# Patient Record
Sex: Male | Born: 1945 | Race: White | Hispanic: No | Marital: Married | Smoking: Former smoker
Health system: Southern US, Community
[De-identification: ages and names within clinical notes are randomized; demographics above are authoritative.]

## PROBLEM LIST (undated history)

## (undated) DIAGNOSIS — C459 Mesothelioma, unspecified: Secondary | ICD-10-CM

## (undated) HISTORY — DX: Mesothelioma, unspecified: C45.9

## (undated) HISTORY — PX: WRIST FRACTURE SURGERY: SHX121

---

## 2006-01-30 ENCOUNTER — Emergency Department (HOSPITAL_COMMUNITY): Admission: EM | Admit: 2006-01-30 | Discharge: 2006-01-31 | Payer: Self-pay | Admitting: Emergency Medicine

## 2010-02-19 ENCOUNTER — Inpatient Hospital Stay (HOSPITAL_COMMUNITY): Admission: EM | Admit: 2010-02-19 | Discharge: 2010-02-23 | Payer: Self-pay | Admitting: Emergency Medicine

## 2010-02-19 ENCOUNTER — Ambulatory Visit: Payer: Self-pay | Admitting: Cardiovascular Disease

## 2010-02-19 ENCOUNTER — Ambulatory Visit: Payer: Self-pay | Admitting: Internal Medicine

## 2010-02-23 ENCOUNTER — Ambulatory Visit: Payer: Self-pay | Admitting: Cardiovascular Disease

## 2010-03-01 ENCOUNTER — Encounter: Admission: RE | Admit: 2010-03-01 | Discharge: 2010-03-01 | Payer: Self-pay | Admitting: Sports Medicine

## 2010-09-08 LAB — COMPREHENSIVE METABOLIC PANEL
Albumin: 3.3 g/dL — ABNORMAL LOW (ref 3.5–5.2)
BUN: 6 mg/dL (ref 6–23)
Calcium: 8.4 mg/dL (ref 8.4–10.5)
Creatinine, Ser: 0.97 mg/dL (ref 0.4–1.5)
GFR calc non Af Amer: >60 mL/min (ref >60)
Glucose, Bld: 162 mg/dL — ABNORMAL HIGH (ref 70–99)
Sodium: 142 mEq/L (ref 135–145)

## 2010-09-08 LAB — BASIC METABOLIC PANEL
BUN: 5 mg/dL — ABNORMAL LOW (ref 6–23)
CO2: 27 mEq/L (ref 19–32)
Chloride: 113 mEq/L — ABNORMAL HIGH (ref 96–112)
Creatinine, Ser: 0.94 mg/dL (ref 0.4–1.5)
GFR calc Af Amer: >60 mL/min (ref >60)
Potassium: 4.3 mEq/L (ref 3.5–5.1)

## 2010-09-08 LAB — CARDIAC PANEL(CRET KIN+CKTOT+MB+TROPI)
CK, MB: 1.1 ng/mL (ref 0.3–4.0)
CK, MB: 1.3 ng/mL (ref 0.3–4.0)
Relative Index: 0.7 (ref 0.0–2.5)
Relative Index: 0.8 (ref 0.0–2.5)
Troponin I: 0.01 ng/mL (ref 0.00–0.06)
Troponin I: 0.01 ng/mL (ref 0.00–0.06)

## 2010-09-08 LAB — CBC
HCT: 36.8 % — ABNORMAL LOW (ref 39.0–52.0)
Hemoglobin: 12.4 g/dL — ABNORMAL LOW (ref 13.0–17.0)
MCH: 31.3 pg (ref 26.0–34.0)
MCHC: 33.7 g/dL (ref 30.0–36.0)
MCHC: 34.4 g/dL (ref 30.0–36.0)
Platelets: 176 10*3/uL (ref 150–400)
RBC: 3.96 MIL/uL — ABNORMAL LOW (ref 4.22–5.81)
RBC: 4.4 MIL/uL (ref 4.22–5.81)

## 2010-09-08 LAB — DIFFERENTIAL
Basophils Absolute: 0 10*3/uL (ref 0.0–0.1)
Basophils Relative: 0 % (ref 0–1)
Basophils Relative: 0 % (ref 0–1)
Eosinophils Absolute: 0 10*3/uL (ref 0.0–0.7)
Eosinophils Absolute: 0.1 10*3/uL (ref 0.0–0.7)
Eosinophils Relative: 0 % (ref 0–5)
Eosinophils Relative: 2 % (ref 0–5)
Lymphocytes Relative: 12 % (ref 12–46)
Lymphocytes Relative: 13 % (ref 12–46)
Lymphs Abs: 0.8 10*3/uL (ref 0.7–4.0)
Lymphs Abs: 0.8 10*3/uL (ref 0.7–4.0)
Monocytes Absolute: 0.5 10*3/uL (ref 0.1–1.0)
Monocytes Relative: 7 % (ref 3–12)
Neutro Abs: 4.9 10*3/uL (ref 1.7–7.7)
Neutro Abs: 5.2 10*3/uL (ref 1.7–7.7)
Neutrophils Relative %: 74 % (ref 43–77)
Neutrophils Relative %: 79 % — ABNORMAL HIGH (ref 43–77)

## 2010-09-08 LAB — VITAMIN B12: Vitamin B-12: 783 pg/mL (ref 211–911)

## 2010-09-08 LAB — POCT I-STAT, CHEM 8
Chloride: 110 mEq/L (ref 96–112)
Sodium: 143 mEq/L (ref 135–145)
TCO2: 23 mmol/L (ref 0–100)

## 2010-09-08 LAB — RETICULOCYTES
RBC.: 3.89 MIL/uL — ABNORMAL LOW (ref 4.22–5.81)
Retic Count, Absolute: 31.1 10*3/uL (ref 19.0–186.0)

## 2010-09-08 LAB — FERRITIN: Ferritin: 118 ng/mL (ref 22–322)

## 2010-09-08 LAB — IRON AND TIBC
Iron: 60 ug/dL (ref 42–135)
TIBC: 253 ug/dL (ref 215–435)

## 2010-09-08 LAB — D-DIMER, QUANTITATIVE: D-Dimer, Quant: 1.74 ug/mL-FEU — ABNORMAL HIGH (ref 0.00–0.48)

## 2015-12-07 ENCOUNTER — Emergency Department (HOSPITAL_COMMUNITY): Admission: EM | Admit: 2015-12-07 | Discharge: 2015-12-08 | Discharge status: Against Medical Advice | Payer: 59

## 2015-12-07 NOTE — ED Notes (Signed)
Pt called, no answer. 1st attempt.

## 2015-12-10 ENCOUNTER — Emergency Department (HOSPITAL_COMMUNITY)
Admission: EM | Admit: 2015-12-10 | Discharge: 2015-12-10 | Disposition: A | Discharge status: Home / Self Care | Payer: Medicare Other | Attending: Emergency Medicine | Admitting: Emergency Medicine

## 2015-12-10 ENCOUNTER — Encounter (HOSPITAL_COMMUNITY): Payer: Self-pay | Admitting: Family Medicine

## 2015-12-10 ENCOUNTER — Emergency Department (HOSPITAL_COMMUNITY): Payer: Medicare Other

## 2015-12-10 DIAGNOSIS — K5900 Constipation, unspecified: Secondary | ICD-10-CM | POA: Insufficient documentation

## 2015-12-10 DIAGNOSIS — N200 Calculus of kidney: Secondary | ICD-10-CM | POA: Diagnosis not present

## 2015-12-10 DIAGNOSIS — N202 Calculus of kidney with calculus of ureter: Secondary | ICD-10-CM | POA: Diagnosis not present

## 2015-12-10 DIAGNOSIS — R1084 Generalized abdominal pain: Secondary | ICD-10-CM | POA: Diagnosis not present

## 2015-12-10 DIAGNOSIS — Z7982 Long term (current) use of aspirin: Secondary | ICD-10-CM | POA: Diagnosis not present

## 2015-12-10 DIAGNOSIS — R1032 Left lower quadrant pain: Secondary | ICD-10-CM | POA: Diagnosis not present

## 2015-12-10 DIAGNOSIS — R109 Unspecified abdominal pain: Secondary | ICD-10-CM

## 2015-12-10 LAB — COMPREHENSIVE METABOLIC PANEL
ALK PHOS: 54 U/L (ref 38–126)
ALT: 18 U/L (ref 17–63)
AST: 21 U/L (ref 15–41)
Albumin: 3.8 g/dL (ref 3.5–5.0)
Anion gap: 6 (ref 5–15)
BILIRUBIN TOTAL: 0.9 mg/dL (ref 0.3–1.2)
BUN: 18 mg/dL (ref 6–20)
CALCIUM: 9 mg/dL (ref 8.9–10.3)
CO2: 24 mmol/L (ref 22–32)
CREATININE: 1.93 mg/dL — AB (ref 0.61–1.24)
Chloride: 107 mmol/L (ref 101–111)
GFR, EST AFRICAN AMERICAN: 40 mL/min — AB (ref >60)
GFR, EST NON AFRICAN AMERICAN: 34 mL/min — AB (ref >60)
Glucose, Bld: 192 mg/dL — ABNORMAL HIGH (ref 65–99)
Potassium: 3.8 mmol/L (ref 3.5–5.1)
Sodium: 137 mmol/L (ref 135–145)
TOTAL PROTEIN: 6.3 g/dL — AB (ref 6.5–8.1)

## 2015-12-10 LAB — URINE MICROSCOPIC-ADD ON

## 2015-12-10 LAB — CBC
HCT: 41.7 % (ref 39.0–52.0)
Hemoglobin: 13.6 g/dL (ref 13.0–17.0)
MCH: 30.6 pg (ref 26.0–34.0)
MCHC: 32.6 g/dL (ref 30.0–36.0)
MCV: 93.9 fL (ref 78.0–100.0)
PLATELETS: 202 10*3/uL (ref 150–400)
RBC: 4.44 MIL/uL (ref 4.22–5.81)
RDW: 13.6 % (ref 11.5–15.5)
WBC: 7.2 10*3/uL (ref 4.0–10.5)

## 2015-12-10 LAB — URINALYSIS, ROUTINE W REFLEX MICROSCOPIC
Bilirubin Urine: NEGATIVE
GLUCOSE, UA: NEGATIVE mg/dL
KETONES UR: 15 mg/dL — AB
LEUKOCYTES UA: NEGATIVE
Nitrite: NEGATIVE
PROTEIN: NEGATIVE mg/dL
Specific Gravity, Urine: 1.026 (ref 1.005–1.030)
pH: 5.5 (ref 5.0–8.0)

## 2015-12-10 LAB — LIPASE, BLOOD: LIPASE: 31 U/L (ref 11–51)

## 2015-12-10 MED ORDER — SODIUM CHLORIDE 0.9 % IV BOLUS (SEPSIS)
1000.0000 mL | Freq: Once | INTRAVENOUS | Status: AC
  Administered 2015-12-10: 1000 mL via INTRAVENOUS

## 2015-12-10 MED ORDER — TAMSULOSIN HCL 0.4 MG PO CAPS
0.4000 mg | ORAL_CAPSULE | Freq: Every day | ORAL | Status: DC
Start: 2015-12-10 — End: 2019-06-29

## 2015-12-10 MED ORDER — MAGNESIUM CITRATE PO SOLN: 1.0000 | Freq: Once | ORAL | Status: DC

## 2015-12-10 MED ORDER — ONDANSETRON 4 MG PO TBDP: 4.0000 mg | ORAL_TABLET | Freq: Three times a day (TID) | ORAL | Status: DC | PRN

## 2015-12-10 MED ORDER — HYDROCODONE-ACETAMINOPHEN 5-325 MG PO TABS: 2.0000 | ORAL_TABLET | ORAL | Status: DC | PRN

## 2015-12-10 NOTE — ED Notes (Signed)
Pt is in stable condition upon d/c and ambulates from ED. 

## 2015-12-10 NOTE — ED Provider Notes (Signed)
CSN: [id number]     Arrival date & time 12/10/15  1138 History   First MD Initiated Contact with Patient 12/10/15 1313     Chief Complaint  Patient presents with  . Abdominal Pain     (Consider location/radiation/quality/duration/timing/severity/associated sxs/prior Treatment) HPI  3 days of constipation despite miralax, 9/10 pain LLQ now radiating to lower back Woke up Wednesday feeling ok, dull pain later in the afternoon now severe squeezing pain, only better with moving around, advil, miralax without help.  Thursday improved some, Friday bicycle for 82mles then pain returned. Still no BM. Nothing on fleets enema. Is passing flatus.   Went to urgent care, has had kidney stone before but not exactly the same.    History reviewed. No pertinent past medical history. History reviewed. No pertinent past surgical history. History reviewed. No pertinent family history. Social History  Substance Use Topics  . Smoking status: Never Smoker   . Smokeless tobacco: None  . Alcohol Use: None    Review of Systems  Constitutional: Positive for appetite change. Negative for fever.  HENT: Negative for sore throat.   Eyes: Negative for visual disturbance.  Respiratory: Negative for shortness of breath.   Cardiovascular: Negative for chest pain.  Gastrointestinal: Positive for nausea, vomiting and abdominal pain.  Genitourinary: Negative for difficulty urinating.  Musculoskeletal: Positive for back pain. Negative for neck stiffness.  Skin: Negative for rash.  Neurological: Negative for syncope and headaches.      Allergies  Review of patient's allergies indicates no known allergies.  Home Medications   Prior to Admission medications   Medication Sig Start Date End Date Taking? Authorizing Provider  aspirin 81 MG tablet Take 81 mg by mouth daily.   Yes Historical Provider, MD  Ginkgo Biloba 40 MG TABS Take 40 mg by mouth daily.   Yes Historical Provider, MD  Multiple  Vitamins-Minerals (MULTIVITAMIN WITH MINERALS) tablet Take 1 tablet by mouth daily.   Yes Historical Provider, MD  HYDROcodone-acetaminophen (NORCO/VICODIN) 5-325 MG tablet Take 2 tablets by mouth every 4 (four) hours as needed. 12/10/15   EGareth Morgan MD  magnesium citrate SOLN Take 296 mLs (1 Bottle total) by mouth once. 12/10/15   EGareth Morgan MD  ondansetron (ZOFRAN ODT) 4 MG disintegrating tablet Take 1 tablet (4 mg total) by mouth every 8 (eight) hours as needed for nausea or vomiting. 12/10/15   EGareth Morgan MD  tamsulosin (FLOMAX) 0.4 MG CAPS capsule Take 1 capsule (0.4 mg total) by mouth daily. 12/10/15   EGareth Morgan MD   BP 159/92 mmHg  Pulse 65  Temp(Src) 98 F (36.7 C) (Oral)  Resp 18  Ht '5\' 7"'$  (1.702 m)  Wt 152 lb 1 oz (68.975 kg)  BMI 23.81 kg/m2  SpO2 98% Physical Exam  Constitutional: He is oriented to person, place, and time. He appears well-developed and well-nourished. No distress.  HENT:  Head: Normocephalic and atraumatic.  Eyes: Conjunctivae and EOM are normal.  Neck: Normal range of motion.  Cardiovascular: Normal rate, regular rhythm, normal heart sounds and intact distal pulses.  Exam reveals no gallop and no friction rub.   No murmur heard. Pulmonary/Chest: Effort normal and breath sounds normal. No respiratory distress. He has no wheezes. He has no rales.  Abdominal: Soft. He exhibits no distension. There is no tenderness. There is CVA tenderness (left). There is no guarding.  Musculoskeletal: He exhibits no edema.  Neurological: He is alert and oriented to person, place, and time.  Skin: Skin is warm  and dry. He is not diaphoretic.  Nursing note and vitals reviewed.   ED Course  Procedures (including critical care time) Labs Review Labs Reviewed  COMPREHENSIVE METABOLIC PANEL - Abnormal; Notable for the following:    Glucose, Bld 192 (*)    Creatinine, Ser 1.93 (*)    Total Protein 6.3 (*)    GFR calc non Af Amer 34 (*)    GFR calc Af  Amer 40 (*)    All other components within normal limits  URINALYSIS, ROUTINE W REFLEX MICROSCOPIC (NOT AT [facility]) - Abnormal; Notable for the following:    APPearance CLOUDY (*)    Hgb urine dipstick LARGE (*)    Ketones, ur 15 (*)    All other components within normal limits  URINE MICROSCOPIC-ADD ON - Abnormal; Notable for the following:    Squamous Epithelial / LPF 0-5 (*)    Bacteria, UA RARE (*)    All other components within normal limits  LIPASE, BLOOD  CBC    Imaging Review Ct Renal Stone Study  12/10/2015  CLINICAL DATA:  Left flank pain. EXAM: CT ABDOMEN AND PELVIS WITHOUT CONTRAST TECHNIQUE: Multidetector CT imaging of the abdomen and pelvis was performed following the standard protocol without IV contrast. COMPARISON:  CT abdomen pelvis 03/01/2010 FINDINGS: Lower chest:  No acute findings. Hepatobiliary: No mass visualized on this un-enhanced exam. Pancreas: No mass or inflammatory process identified on this un-enhanced exam. Spleen: Within normal limits in size. Adrenals/Urinary Tract: No evidence of right hydronephrosis. There is a 3 mm nonobstructing right renal calculus within the lower pole. There is a 4.4 mm calculus within the proximal left ureter with associated upstream hydroureter and mild left hydronephrosis. There are significant left perirenal inflammatory changes. No definite mass visualized on this un-enhanced exam. Stomach/Bowel: No evidence of obstruction, inflammatory process, or abnormal fluid collections. Diffuse colonic diverticulosis without evidence of diverticulitis. Normal appendix. Vascular/Lymphatic: No pathologically enlarged lymph nodes. No evidence of abdominal aortic aneurysm. Reproductive: Heterogeneous appearance of the prostate gland. Other: None. Musculoskeletal: No suspicious bone lesions identified. Osteoarthritic changes of the lumbosacral spine particularly prominent and L2-L3 where there is a large disc osteophyte complex. IMPRESSION: Left  obstructive uropathy caused by 4.4 mm left proximal ureteral calculus. Nonobstructive 3 mm right renal calculus. Heterogeneous appearance of the prostate gland. Please correlate with serum PSA values. Scattered colonic diverticulosis without evidence of diverticulitis. Electronically Signed   By: Fidela Salisbury M.D.   On: 12/10/2015 15:56   I have personally reviewed and evaluated these images and lab results as part of my medical decision-making.   EKG Interpretation None      MDM   Final diagnoses:  Left flank pain  Nephrolithiasis  Constipation, unspecified constipation type   70yo male with remote hx of nephrolithiasis presents with concern for left lower quadrant and flank pain, constipation for 3 days. CT shows obstructing 4.43m left nephrolithiasis. Labs show Cr 1.9, unknown baseline in setting of last Cr from 6 years ago, no PCP follow up. Cr elevation may be dehydration due to decreased po intake over the last several days, versus acute on chronic injury, vs chronic. Given IVF in the emergency department. Recommend close PCP follow up and Urology follow up for elevated Creatinine, nephrolithiasis and heterogeneous appearance of prostate on CT.  Discussed constipation treatment with pt in detail, avoiding opiates, avoiding NSAIDs given kidney disease. Discussed taking senokot and miralax 4 caps in one 32oz bottle, miralax taper over next days, mag citrate if no results,  and lots of hydration for both nephrolithiasis and constipation.  Given flomax, zofran, norco for nephrolithiasis. Given number for urology follow up. Patient discharged in stable condition with understanding of reasons to return.     Gareth Morgan, MD 12/10/15 2244

## 2015-12-10 NOTE — ED Notes (Signed)
Pt here for LLQ pain that started Wednesday. sts sudden onset with diaphoresis, N,V. sts took miralax Wednesday night and Thursday the pain improved. sts took miralax again Thursday night and Friday morning he felt fine. sts he rode his bike 12 miles. sts this morning the pain returned and radiated into back and became intense. sts no BM in 3 days. sts tried fleet enema Friday night

## 2015-12-13 DIAGNOSIS — N200 Calculus of kidney: Secondary | ICD-10-CM | POA: Diagnosis not present

## 2016-01-03 DIAGNOSIS — N201 Calculus of ureter: Secondary | ICD-10-CM | POA: Diagnosis not present

## 2017-09-30 DIAGNOSIS — H02401 Unspecified ptosis of right eyelid: Secondary | ICD-10-CM | POA: Diagnosis not present

## 2017-09-30 DIAGNOSIS — H25013 Cortical age-related cataract, bilateral: Secondary | ICD-10-CM | POA: Diagnosis not present

## 2017-09-30 DIAGNOSIS — H1851 Endothelial corneal dystrophy: Secondary | ICD-10-CM | POA: Diagnosis not present

## 2017-09-30 DIAGNOSIS — H2513 Age-related nuclear cataract, bilateral: Secondary | ICD-10-CM | POA: Diagnosis not present

## 2018-08-03 DIAGNOSIS — S6992XA Unspecified injury of left wrist, hand and finger(s), initial encounter: Secondary | ICD-10-CM | POA: Diagnosis not present

## 2018-08-03 DIAGNOSIS — S52614A Nondisplaced fracture of right ulna styloid process, initial encounter for closed fracture: Secondary | ICD-10-CM | POA: Diagnosis not present

## 2018-08-03 DIAGNOSIS — M25532 Pain in left wrist: Secondary | ICD-10-CM | POA: Diagnosis not present

## 2018-08-04 DIAGNOSIS — M25532 Pain in left wrist: Secondary | ICD-10-CM | POA: Diagnosis not present

## 2018-08-07 DIAGNOSIS — G8918 Other acute postprocedural pain: Secondary | ICD-10-CM | POA: Diagnosis not present

## 2018-08-07 DIAGNOSIS — S52572A Other intraarticular fracture of lower end of left radius, initial encounter for closed fracture: Secondary | ICD-10-CM | POA: Diagnosis not present

## 2018-08-07 DIAGNOSIS — S52502A Unspecified fracture of the lower end of left radius, initial encounter for closed fracture: Secondary | ICD-10-CM | POA: Diagnosis not present

## 2018-08-07 DIAGNOSIS — X58XXXA Exposure to other specified factors, initial encounter: Secondary | ICD-10-CM | POA: Diagnosis not present

## 2018-08-07 DIAGNOSIS — Y999 Unspecified external cause status: Secondary | ICD-10-CM | POA: Diagnosis not present

## 2018-08-18 DIAGNOSIS — S52502D Unspecified fracture of the lower end of left radius, subsequent encounter for closed fracture with routine healing: Secondary | ICD-10-CM | POA: Diagnosis not present

## 2019-05-28 DIAGNOSIS — Z0000 Encounter for general adult medical examination without abnormal findings: Secondary | ICD-10-CM | POA: Diagnosis not present

## 2019-05-28 DIAGNOSIS — Z131 Encounter for screening for diabetes mellitus: Secondary | ICD-10-CM | POA: Diagnosis not present

## 2019-05-28 DIAGNOSIS — M549 Dorsalgia, unspecified: Secondary | ICD-10-CM | POA: Diagnosis not present

## 2019-05-28 DIAGNOSIS — Z1159 Encounter for screening for other viral diseases: Secondary | ICD-10-CM | POA: Diagnosis not present

## 2019-05-28 DIAGNOSIS — Z13220 Encounter for screening for lipoid disorders: Secondary | ICD-10-CM | POA: Diagnosis not present

## 2019-05-28 DIAGNOSIS — J189 Pneumonia, unspecified organism: Secondary | ICD-10-CM | POA: Diagnosis not present

## 2019-06-27 ENCOUNTER — Other Ambulatory Visit: Payer: Self-pay

## 2019-06-27 ENCOUNTER — Encounter (HOSPITAL_COMMUNITY): Payer: Self-pay | Admitting: Emergency Medicine

## 2019-06-27 ENCOUNTER — Emergency Department (HOSPITAL_COMMUNITY)
Admission: EM | Admit: 2019-06-27 | Discharge: 2019-06-27 | Disposition: A | Discharge status: Home / Self Care | Payer: Medicare Other | Attending: Emergency Medicine | Admitting: Emergency Medicine

## 2019-06-27 ENCOUNTER — Emergency Department (HOSPITAL_COMMUNITY): Payer: Medicare Other

## 2019-06-27 DIAGNOSIS — Z7982 Long term (current) use of aspirin: Secondary | ICD-10-CM | POA: Diagnosis not present

## 2019-06-27 DIAGNOSIS — Z79899 Other long term (current) drug therapy: Secondary | ICD-10-CM | POA: Insufficient documentation

## 2019-06-27 DIAGNOSIS — R918 Other nonspecific abnormal finding of lung field: Secondary | ICD-10-CM | POA: Diagnosis not present

## 2019-06-27 DIAGNOSIS — R079 Chest pain, unspecified: Secondary | ICD-10-CM | POA: Diagnosis not present

## 2019-06-27 LAB — CBC
HCT: 41.8 % (ref 39.0–52.0)
Hemoglobin: 13.3 g/dL (ref 13.0–17.0)
MCH: 30.2 pg (ref 26.0–34.0)
MCHC: 31.8 g/dL (ref 30.0–36.0)
MCV: 95 fL (ref 80.0–100.0)
Platelets: 402 10*3/uL — ABNORMAL HIGH (ref 150–400)
RBC: 4.4 MIL/uL (ref 4.22–5.81)
RDW: 13.2 % (ref 11.5–15.5)
WBC: 9.5 10*3/uL (ref 4.0–10.5)
nRBC: 0 % (ref 0.0–0.2)

## 2019-06-27 LAB — BASIC METABOLIC PANEL
Anion gap: 10 (ref 5–15)
BUN: 10 mg/dL (ref 8–23)
CO2: 24 mmol/L (ref 22–32)
Calcium: 9.1 mg/dL (ref 8.9–10.3)
Chloride: 106 mmol/L (ref 98–111)
Creatinine, Ser: 1.03 mg/dL (ref 0.61–1.24)
GFR calc Af Amer: >60 mL/min (ref >60)
GFR calc non Af Amer: >60 mL/min (ref >60)
Glucose, Bld: 116 mg/dL — ABNORMAL HIGH (ref 70–99)
Potassium: 4.2 mmol/L (ref 3.5–5.1)
Sodium: 140 mmol/L (ref 135–145)

## 2019-06-27 LAB — HEPATIC FUNCTION PANEL
ALT: 25 U/L (ref 0–44)
AST: 25 U/L (ref 15–41)
Albumin: 3.7 g/dL (ref 3.5–5.0)
Alkaline Phosphatase: 70 U/L (ref 38–126)
Bilirubin, Direct: 0.2 mg/dL (ref 0.0–0.2)
Indirect Bilirubin: 0.4 mg/dL (ref 0.3–0.9)
Total Bilirubin: 0.6 mg/dL (ref 0.3–1.2)
Total Protein: 8 g/dL (ref 6.5–8.1)

## 2019-06-27 LAB — TROPONIN I (HIGH SENSITIVITY)
Troponin I (High Sensitivity): <2 ng/L (ref <18)
Troponin I (High Sensitivity): <2 ng/L (ref <18)

## 2019-06-27 LAB — LIPASE, BLOOD: Lipase: 34 U/L (ref 11–51)

## 2019-06-27 MED ORDER — HYDROCODONE-ACETAMINOPHEN 5-325 MG PO TABS: 1.0000 | ORAL_TABLET | Freq: Four times a day (QID) | ORAL | 0 refills | Status: DC | PRN

## 2019-06-27 MED ORDER — HYDROMORPHONE HCL 1 MG/ML IJ SOLN
1.0000 mg | Freq: Once | INTRAMUSCULAR | Status: AC
  Administered 2019-06-27: 1 mg via INTRAVENOUS
  Filled 2019-06-27: qty 1

## 2019-06-27 MED ORDER — IOHEXOL 300 MG/ML  SOLN
75.0000 mL | Freq: Once | INTRAMUSCULAR | Status: AC | PRN
  Administered 2019-06-27: 75 mL via INTRAVENOUS

## 2019-06-27 MED ORDER — SODIUM CHLORIDE 0.9% FLUSH
3.0000 mL | Freq: Once | INTRAVENOUS | Status: AC
  Administered 2019-06-27: 3 mL via INTRAVENOUS

## 2019-06-27 MED ORDER — SODIUM CHLORIDE 0.9 % IV SOLN: INTRAVENOUS | Status: DC

## 2019-06-27 MED ORDER — ONDANSETRON HCL 4 MG/2ML IJ SOLN
4.0000 mg | Freq: Once | INTRAMUSCULAR | Status: AC
  Administered 2019-06-27: 12:00:00 4 mg via INTRAVENOUS
  Filled 2019-06-27: qty 2

## 2019-06-27 NOTE — Discharge Instructions (Addendum)
Discussed with Dr. Earlie Server at the hematology oncology clinic at [facility].  They will be contacting you probably on Monday for follow-up.  Or certainly sometime early next week.  Should hear from them on Monday.  They are trying to get you in on Monday.  Take the pain medicine as directed.  Return for any new or worse symptoms.

## 2019-06-27 NOTE — ED Provider Notes (Signed)
[facility] EMERGENCY DEPARTMENT Provider Note   CSN: [id number] Arrival date & time: 06/27/19  4098     History Chief Complaint  Patient presents with  . Chest Pain    Mario Harmon is a 74 y.o. male.  Patient with a complaint of right-sided chest pain that radiates to the back since shortly after Thanksgiving.  Also had decreased appetite.  Seen at [facility] being treated with pain medicine also was on a course of antibiotics but made no difference.  Patient denies any abdominal pain any blood in his bowel movements.  Any history of headaches.  The chest pain is mostly in the back.  Denies any shortness of breath.        History reviewed. No pertinent past medical history.  There are no problems to display for this patient.   History reviewed. No pertinent surgical history.     No family history on file.  Social History   Tobacco Use  . Smoking status: Never Smoker  . Smokeless tobacco: Never Used  Substance Use Topics  . Alcohol use: Not Currently  . Drug use: Never    Home Medications Prior to Admission medications   Medication Sig Start Date End Date Taking? Authorizing Provider  aspirin 81 MG tablet Take 81 mg by mouth daily.    [provider]  Ginkgo Biloba 40 MG TABS Take 40 mg by mouth daily.    [provider]  HYDROcodone-acetaminophen (NORCO/VICODIN) 5-325 MG tablet Take 2 tablets by mouth every 4 (four) hours as needed. 12/10/15   Gareth Morgan, MD  HYDROcodone-acetaminophen (NORCO/VICODIN) 5-325 MG tablet Take 1 tablet by mouth every 6 (six) hours as needed for moderate pain. 06/27/19   Fredia Sorrow, MD  magnesium citrate SOLN Take 296 mLs (1 Bottle total) by mouth once. 12/10/15   Gareth Morgan, MD  Multiple Vitamins-Minerals (MULTIVITAMIN WITH MINERALS) tablet Take 1 tablet by mouth daily.    [provider]  ondansetron (ZOFRAN ODT) 4 MG disintegrating tablet Take 1 tablet (4 mg  total) by mouth every 8 (eight) hours as needed for nausea or vomiting. 12/10/15   Gareth Morgan, MD  tamsulosin (FLOMAX) 0.4 MG CAPS capsule Take 1 capsule (0.4 mg total) by mouth daily. 12/10/15   Gareth Morgan, MD    Allergies    Patient has no known allergies.  Review of Systems   Review of Systems  Constitutional: Positive for appetite change and fatigue. Negative for chills and fever.  HENT: Negative for rhinorrhea and sore throat.   Eyes: Negative for visual disturbance.  Respiratory: Negative for cough and shortness of breath.   Cardiovascular: Positive for chest pain. Negative for leg swelling.  Gastrointestinal: Negative for abdominal pain, diarrhea, nausea and vomiting.  Genitourinary: Negative for dysuria.  Musculoskeletal: Negative for back pain and neck pain.  Skin: Negative for rash.  Neurological: Negative for dizziness, light-headedness and headaches.  Hematological: Does not bruise/bleed easily.  Psychiatric/Behavioral: Negative for confusion.    Physical Exam Updated Vital Signs BP (!) 147/85   Pulse 84   Temp 98 F (36.7 C) (Oral)   Resp 13   Ht 1.727 m (5\' 8" )   Wt 70.8 kg   SpO2 99%   BMI 23.72 kg/m   Physical Exam Vitals and nursing note reviewed.  Constitutional:      Appearance: Normal appearance. He is well-developed.  HENT:     Head: Normocephalic and atraumatic.  Eyes:     Extraocular Movements: Extraocular  movements intact.     Conjunctiva/sclera: Conjunctivae normal.     Pupils: Pupils are equal, round, and reactive to light.  Cardiovascular:     Rate and Rhythm: Normal rate and regular rhythm.     Heart sounds: No murmur.  Pulmonary:     Effort: Pulmonary effort is normal. No respiratory distress.     Breath sounds: Normal breath sounds.  Abdominal:     General: There is no distension.     Palpations: Abdomen is soft.     Tenderness: There is no abdominal tenderness.  Musculoskeletal:        General: No swelling. Normal range  of motion.     Cervical back: Normal range of motion and neck supple.  Skin:    General: Skin is warm and dry.     Capillary Refill: Capillary refill takes less than 2 seconds.  Neurological:     General: No focal deficit present.     Mental Status: He is alert and oriented to person, place, and time.     Cranial Nerves: No cranial nerve deficit.     Sensory: No sensory deficit.     Motor: No weakness.     ED Results / Procedures / Treatments   Labs (all labs ordered are listed, but only abnormal results are displayed) Labs Reviewed  BASIC METABOLIC PANEL - Abnormal; Notable for the following components:      Result Value   Glucose, Bld 116 (*)    All other components within normal limits  CBC - Abnormal; Notable for the following components:   Platelets 402 (*)    All other components within normal limits  LIPASE, BLOOD  HEPATIC FUNCTION PANEL  TROPONIN I (HIGH SENSITIVITY)  TROPONIN I (HIGH SENSITIVITY)    EKG EKG Interpretation  Date/Time:  Saturday June 27 2019 08:21:52 EST Ventricular Rate:  84 PR Interval:  134 QRS Duration: 82 QT Interval:  374 QTC Calculation: 441 R Axis:   61 Text Interpretation: Normal sinus rhythm Nonspecific ST abnormality Abnormal ECG Confirmed by Fredia Sorrow [phone] on 06/27/2019 11:20:00 AM   Radiology DG Chest 2 View  Result Date: 06/27/2019 CLINICAL DATA:  Chest pain. EXAM: CHEST - 2 VIEW COMPARISON:  02/19/2010; chest CT-02/21/2010 FINDINGS: Grossly unchanged cardiac silhouette and mediastinal contours. Interval development of an age-indeterminate likely partially loculated small to moderate-sized right-sided pleural effusion with associated right mid and lower lung heterogeneous/consolidative opacities and volume loss. The left hemithorax remains well aerated. No evidence of edema. No pneumothorax. No acute osseous abnormalities. IMPRESSION: Interval development of age-indeterminate small to moderate-sized partially loculated  right-sided pleural effusion with associated right mid and lower lung opacities, atelectasis versus infiltrate. Correlation with more recent outside examinations (if available) is advised. Otherwise, further evaluation with contrast-enhanced chest CT could performed as indicated. Electronically Signed   By: Sandi Mariscal M.D.   On: 06/27/2019 09:01   CT Chest W Contrast  Result Date: 06/27/2019 CLINICAL DATA:  Evaluate pleural effusion. EXAM: CT CHEST WITH CONTRAST TECHNIQUE: Multidetector CT imaging of the chest was performed during intravenous contrast administration. CONTRAST:  18mL OMNIPAQUE IOHEXOL 300 MG/ML  SOLN COMPARISON:  None FINDINGS: Cardiovascular: The heart size is normal. No pericardial effusion identified. Mediastinum/Nodes: Enlarged right internal mammary lymph node measures 2.2 cm, image 41/3 within the anterior mediastinum there is a ovoid solid mass measuring 4.7 x 2.9 cm, image 71/3 thyroid gland, trachea, and esophagus demonstrate no significant findings. Lungs/Pleura: Moderate loculated right pleural effusion identified with extensive solid enhancing  pleural based tumor. Index mass within the right lateral costophrenic sulcus measures 6.5 x 3.1 cm, image 123/3. Index right lateral pleural based tumor overlying the mid lung measures 4.0 x 2.5 cm, image 80/3. Pleural tumor overlying the posterolateral right upper lobe measures 7.8 x 1.9 cm, image 59/3. Upper Abdomen: No acute abnormality. Musculoskeletal: No chest wall abnormality. No acute or significant osseous findings. Lytic lesion involving the lateral aspect of the right fourth and sixth ribs. IMPRESSION: 1. 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 In the absence of known malignancy findings are suspicious for primary bronchogenic carcinoma. Further investigation with PET-CT and tissue sampling advised. 2. Enlarged right internal mammary lymph node and  anterior mediastinal mass compatible with metastatic adenopathy. 3. Lytic lesion involving the lateral aspect of the right fourth and sixth ribs is identified compatible with osseous metastasis. 4. Aortic atherosclerosis. Aortic Atherosclerosis (ICD10-I70.0). Electronically Signed   By: Kerby Moors M.D.   On: 06/27/2019 14:01    Procedures Procedures (including critical care time)  Medications Ordered in ED Medications  0.9 %  sodium chloride infusion ( Intravenous New Bag/Given 06/27/19 1217)  sodium chloride flush (NS) 0.9 % injection 3 mL (3 mLs Intravenous Given 06/27/19 1220)  ondansetron (ZOFRAN) injection 4 mg (4 mg Intravenous Given 06/27/19 1218)  HYDROmorphone (DILAUDID) injection 1 mg (1 mg Intravenous Given 06/27/19 1218)  iohexol (OMNIPAQUE) 300 MG/ML solution 75 mL (75 mLs Intravenous Contrast Given 06/27/19 1336)    ED Course  I have reviewed the triage vital signs and the nursing notes.  Pertinent labs & imaging results that were available during my care of the patient were reviewed by me and considered in my medical decision making (see chart for details).    MDM Rules/Calculators/A&P                      Discussed with hematology oncology.  They will follow him up early this week.  Perhaps even on Monday.  Patient's pain medication renewed.  CT of chest findings highly suggestive of lung cancer not clear whether primary or metastasis.  Patient stable for discharge home.  Patient CT findings consistent with neoplastic process.  May very well be primary bronchogenic.  Or could be metastasis.  Patient is hydrocodone pain medicine renewed.  Will be following up with hematology oncology this week.   Final Clinical Impression(s) / ED Diagnoses Final diagnoses:  Mass of right lung    Rx / DC Orders ED Discharge Orders         Ordered    HYDROcodone-acetaminophen (NORCO/VICODIN) 5-325 MG tablet  Every 6 hours PRN     06/27/19 1612           Fredia Sorrow,  MD 06/27/19 1621

## 2019-06-27 NOTE — ED Triage Notes (Signed)
C/o R sided chest pain that radiates to back x 1 month.  States he has been seeing [facility] for same and takes Norco for pain.  Took Norco 1 tab at [facility].  Denies any other associated symptoms.

## 2019-06-27 NOTE — ED Notes (Signed)
Patient verbalizes understanding of discharge instructions. Opportunity for questioning and answers were provided. Armband removed by staff, pt discharged from ED.  

## 2019-06-29 ENCOUNTER — Inpatient Hospital Stay: Payer: Medicare Other | Attending: Internal Medicine | Admitting: Internal Medicine

## 2019-06-29 ENCOUNTER — Other Ambulatory Visit: Payer: Self-pay

## 2019-06-29 ENCOUNTER — Inpatient Hospital Stay: Payer: Medicare Other

## 2019-06-29 ENCOUNTER — Telehealth: Payer: Self-pay | Admitting: Internal Medicine

## 2019-06-29 DIAGNOSIS — M899 Disorder of bone, unspecified: Secondary | ICD-10-CM | POA: Diagnosis not present

## 2019-06-29 DIAGNOSIS — C3490 Malignant neoplasm of unspecified part of unspecified bronchus or lung: Secondary | ICD-10-CM | POA: Insufficient documentation

## 2019-06-29 DIAGNOSIS — R911 Solitary pulmonary nodule: Secondary | ICD-10-CM

## 2019-06-29 DIAGNOSIS — J910 Malignant pleural effusion: Secondary | ICD-10-CM | POA: Insufficient documentation

## 2019-06-29 DIAGNOSIS — J90 Pleural effusion, not elsewhere classified: Secondary | ICD-10-CM

## 2019-06-29 DIAGNOSIS — C7951 Secondary malignant neoplasm of bone: Secondary | ICD-10-CM | POA: Insufficient documentation

## 2019-06-29 DIAGNOSIS — R918 Other nonspecific abnormal finding of lung field: Secondary | ICD-10-CM

## 2019-06-29 DIAGNOSIS — G893 Neoplasm related pain (acute) (chronic): Secondary | ICD-10-CM

## 2019-06-29 DIAGNOSIS — C782 Secondary malignant neoplasm of pleura: Secondary | ICD-10-CM

## 2019-06-29 DIAGNOSIS — R52 Pain, unspecified: Secondary | ICD-10-CM

## 2019-06-29 DIAGNOSIS — I700 Atherosclerosis of aorta: Secondary | ICD-10-CM | POA: Insufficient documentation

## 2019-06-29 NOTE — Telephone Encounter (Signed)
A new pt appt has been scheduled for Mario Harmon to see Dr. Julien Nordmann today at 2:15pm with labs at 1:45pm. Mario Harmon has been cld and agreed to the appt date and time. He's been made aware to arrive 15 minutes early.

## 2019-06-29 NOTE — Progress Notes (Signed)
[place] [phone]   [phone]  CONSULT NOTE  REFERRING PHYSICIAN: Dr. Fredia Sorrow  REASON FOR CONSULTATION:  74 years old white male with highly suspicious lung cancer.  HPI STOKES RATTIGAN is a 74 y.o. male with no significant past medical history except for kidney stone and left wrist fracture after fall during biking.  The patient was very active and biking more than 100 mile every week.  He also does heavy lifting.  He mentions that after Thanksgiving he was cutting some trees in his yard and started feeling pain and spasm in the right shoulder area.  He started taking ibuprofen with initially minimal improvement.  He was seen at [facility] clinic urgent care for evaluation of his persistent pain.  Chest x-ray at that time was suspicious for airspace disease in the right lung and the patient was treated with a course of antibiotics in addition to pain medication in the form of tramadol.  He did not have any improvement and was seen another time in the clinic with change in the antibiotic as well as pain medication in the form of hydrocodone.  He was supposed to have CT scan of the chest but his pain was getting worse and the patient presented to the emergency department on June 27, 2019 for evaluation.  Chest x-ray in the emergency department showed interval development of age indeterminate small to moderate size partially loculated right pleural effusion with associated right mid and lower lung opacities, atelectasis versus infiltrate.  This was followed by CT scan of the chest with contrast on June 26, 2018 and that showed moderate loculated right pleural effusion with extensive solid enhancing pleural-based tumor.  Index mass within the right lateral costophrenic sulcus measured 6.5 x 3.1 cm.  There was also a index right lateral pleural-based tumor overlying the mid lung measuring 4.0 x 2.5 cm as well as pleural tumor overlying the posterior  lateral right upper lobe measuring 7.8 x 1.9 cm.  The scan also showed enlarging right internal mammary lymph node measuring 2.2 cm and within the anterior mediastinum there was an ovoid solid mass measuring 4.7 x 2.9 cm.  The scan also showed lytic lesions involving the lateral aspect of the right fourth and sixth ribs. The patient was referred to me today for evaluation and recommendation regarding these abnormalities. When seen today the patient continues to have pain on the right side of the chest as well as right shoulder blade and back.  He denied having any significant shortness of breath except with exertion and no cough or hemoptysis.  He denied having any current fever or chills.  He has no nausea, vomiting, diarrhea or constipation.  He has no headache or visual changes.  He has no recent weight loss or night sweats. Family history significant for a mother with lung cancer but she was a heavy smoker.  He does not know his father's medical history. The patient is married and has no children.  He used to work in [employer].  He has a history of his smoking pipes for around 30 years and quit 20 years ago.  He has no history of alcohol or drug abuse.  Social History Social History   Tobacco Use  . Smoking status: Never Smoker  . Smokeless tobacco: Never Used  Substance Use Topics  . Alcohol use: Not Currently  . Drug use: Never    No Known Allergies  Current Outpatient Medications  Medication Sig Dispense Refill  . HYDROcodone-acetaminophen (NORCO/VICODIN) 5-325 MG tablet Take 1 tablet by mouth every 6 (six) hours as needed for moderate pain. 14 tablet 0  . magnesium citrate SOLN Take 296 mLs (1 Bottle total) by mouth once. (Patient not taking: Reported on 06/29/2019) 195 mL 0   No current facility-administered medications for this visit.    Review of Systems  Constitutional: positive for fatigue Eyes: negative Ears, nose, mouth, throat, and face:  negative Respiratory: positive for dyspnea on exertion and pleurisy/chest pain Cardiovascular: negative Gastrointestinal: negative Genitourinary:negative Integument/breast: negative Hematologic/lymphatic: negative Musculoskeletal:positive for back pain Neurological: negative Behavioral/Psych: negative Endocrine: negative Allergic/Immunologic: negative  Physical Exam  ZOX:WRUEA, healthy, no distress, well nourished, well developed and anxious SKIN: skin color, texture, turgor are normal, no rashes or significant lesions HEAD: Normocephalic, No masses, lesions, tenderness or abnormalities EYES: normal, PERRLA, Conjunctiva are pink and non-injected EARS: External ears normal, Canals clear OROPHARYNX:no exudate, no erythema and lips, buccal mucosa, and tongue normal  NECK: supple, no adenopathy, no JVD LYMPH:  no palpable lymphadenopathy, no hepatosplenomegaly LUNGS: coarse sounds heard, decreased breath sounds HEART: regular rate & rhythm, no murmurs and no gallops ABDOMEN:abdomen soft, non-tender, normal bowel sounds and no masses or organomegaly BACK: Back symmetric, no curvature., No CVA tenderness EXTREMITIES:no joint deformities, effusion, or inflammation, no edema  NEURO: alert & oriented x 3 with fluent speech, no focal motor/sensory deficits  PERFORMANCE STATUS: ECOG 1  LABORATORY DATA: Lab Results  Component Value Date   WBC 9.5 06/27/2019   HGB 13.3 06/27/2019   HCT 41.8 06/27/2019   MCV 95.0 06/27/2019   PLT 402 (H) 06/27/2019      Chemistry      Component Value Date/Time   NA 140 06/27/2019 0916   K 4.2 06/27/2019 0916   CL 106 06/27/2019 0916   CO2 24 06/27/2019 0916   BUN 10 06/27/2019 0916   CREATININE 1.03 06/27/2019 0916      Component Value Date/Time   CALCIUM 9.1 06/27/2019 0916   ALKPHOS 70 06/27/2019 1213   AST 25 06/27/2019 1213   ALT 25 06/27/2019 1213   BILITOT 0.6 06/27/2019 1213       RADIOGRAPHIC STUDIES: DG Chest 2 View  Result  Date: 06/27/2019 CLINICAL DATA:  Chest pain. EXAM: CHEST - 2 VIEW COMPARISON:  02/19/2010; chest CT-02/21/2010 FINDINGS: Grossly unchanged cardiac silhouette and mediastinal contours. Interval development of an age-indeterminate likely partially loculated small to moderate-sized right-sided pleural effusion with associated right mid and lower lung heterogeneous/consolidative opacities and volume loss. The left hemithorax remains well aerated. No evidence of edema. No pneumothorax. No acute osseous abnormalities. IMPRESSION: Interval development of age-indeterminate small to moderate-sized partially loculated right-sided pleural effusion with associated right mid and lower lung opacities, atelectasis versus infiltrate. Correlation with more recent outside examinations (if available) is advised. Otherwise, further evaluation with contrast-enhanced chest CT could performed as indicated. Electronically Signed   By: Sandi Mariscal M.D.   On: 06/27/2019 09:01   CT Chest W Contrast  Result Date: 06/27/2019 CLINICAL DATA:  Evaluate pleural effusion. EXAM: CT CHEST WITH CONTRAST TECHNIQUE: Multidetector CT imaging of the chest was performed during intravenous contrast administration. CONTRAST:  6mL OMNIPAQUE IOHEXOL 300 MG/ML  SOLN COMPARISON:  None FINDINGS: Cardiovascular: The heart size is normal. No pericardial effusion identified. Mediastinum/Nodes: Enlarged right internal mammary lymph node measures 2.2 cm, image 41/3 within the anterior mediastinum there is a ovoid solid mass measuring 4.7 x 2.9 cm, image 71/3 thyroid gland, trachea,  and esophagus demonstrate no significant findings. Lungs/Pleura: Moderate loculated right pleural effusion identified with extensive solid enhancing pleural based tumor. Index mass within the right lateral costophrenic sulcus measures 6.5 x 3.1 cm, image 123/3. Index right lateral pleural based tumor overlying the mid lung measures 4.0 x 2.5 cm, image 80/3. Pleural tumor overlying the  posterolateral right upper lobe measures 7.8 x 1.9 cm, image 59/3. Upper Abdomen: No acute abnormality. Musculoskeletal: No chest wall abnormality. No acute or significant osseous findings. Lytic lesion involving the lateral aspect of the right fourth and sixth ribs. IMPRESSION: 1. 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 In the absence of known malignancy findings are suspicious for primary bronchogenic carcinoma. Further investigation with PET-CT and tissue sampling advised. 2. Enlarged right internal mammary lymph node and anterior mediastinal mass compatible with metastatic adenopathy. 3. Lytic lesion involving the lateral aspect of the right fourth and sixth ribs is identified compatible with osseous metastasis. 4. Aortic atherosclerosis. Aortic Atherosclerosis (ICD10-I70.0). Electronically Signed   By: Kerby Moors M.D.   On: 06/27/2019 14:01    ASSESSMENT: This is a very pleasant 74 years old white male with highly suspicious metastatic lung cancer presented with multiple large pleural-based masses in addition to right pleural effusion and lytic lesions in the right ribs.   PLAN: I had a lengthy discussion with the patient today about his current disease stage, further investigation and possible treatment options. I personally and independently reviewed the CT scan images and discussed the result and showed the images to the patient today. I recommended for the patient to undergo ultrasound-guided right thoracentesis for diagnostic and therapeutic purposes.  If the pleural fluid is not diagnostic, I will consider the patient for CT-guided core biopsy of one of the pleural-based masses. I will also arrange for the patient to have a PET scan as well as MRI of the brain to complete the staging work-up of his disease. For pain management he will continue his current treatment with hydrocodone on as-needed basis and  I will be happy to give him refill in the future if needed. I will see the patient back for follow-up visit in around 10 days for more detailed discussion of his treatment options based on the final pathology and staging work-up. The patient was advised to call immediately if he has any concerning symptoms in the interval. The patient voices understanding of current disease status and treatment options and is in agreement with the current care plan.  All questions were answered. The patient knows to call the clinic with any problems, questions or concerns. We can certainly see the patient much sooner if necessary.  Thank you so much for allowing me to participate in the care of ADELAIDO NICKLAUS. I will continue to follow up the patient with you and assist in his care.  I spent 40 minutes counseling the patient face to face. The total time spent in the appointment was 60 minutes.  Disclaimer: This note was dictated with voice recognition software. Similar sounding words can inadvertently be transcribed and may not be corrected upon review.   Eilleen Kempf June 29, 2019, 2:36 PM

## 2019-06-29 NOTE — Telephone Encounter (Signed)
Scheduled appt per 1/4 los - gave patient AVS and calender per los,

## 2019-06-30 ENCOUNTER — Ambulatory Visit (HOSPITAL_COMMUNITY)
Admission: RE | Admit: 2019-06-30 | Discharge: 2019-06-30 | Disposition: A | Discharge status: Home / Self Care | Payer: Medicare Other | Source: Ambulatory Visit | Attending: Internal Medicine | Admitting: Internal Medicine

## 2019-06-30 DIAGNOSIS — Z01812 Encounter for preprocedural laboratory examination: Secondary | ICD-10-CM | POA: Insufficient documentation

## 2019-06-30 DIAGNOSIS — Z20822 Contact with and (suspected) exposure to covid-19: Secondary | ICD-10-CM | POA: Diagnosis not present

## 2019-07-01 LAB — NOVEL CORONAVIRUS, NAA (HOSP ORDER, SEND-OUT TO REF LAB; TAT 18-24 HRS): SARS-CoV-2, NAA: NOT DETECTED

## 2019-07-02 ENCOUNTER — Other Ambulatory Visit: Payer: Self-pay | Admitting: Internal Medicine

## 2019-07-02 ENCOUNTER — Encounter (HOSPITAL_COMMUNITY): Payer: Self-pay

## 2019-07-02 ENCOUNTER — Telehealth: Payer: Self-pay | Admitting: *Deleted

## 2019-07-02 DIAGNOSIS — J90 Pleural effusion, not elsewhere classified: Secondary | ICD-10-CM

## 2019-07-02 NOTE — Progress Notes (Signed)
Francia Greaves Male, 74 y.o., 01/26/1946 MRN:  [id number] Phone:  [phone] (H) ... PCP:  Patient, No Pcp Per Primary Cvg:  Medicare/Medicare Part A And B Next Appt With Radiology ([facility]) 07/03/2019 at 11:00 AM  RE: Biopsy Received: Today Message Contents  Markus Daft, MD  Ernestene Mention for CT guided biopsy of right chest wall/pleural mass. See Chest CT from 06/27/19, se 3, im 85 along the right lateral chest and involving the rib.    Previous Messages  ----- Message -----  From: Lenore Cordia  Sent: 07/02/2019 10:17 AM EST  To: Ir Procedure Requests  Subject: Biopsy                      Procedure Requested: Ct guided Core Biopsy    Reason for Procedure: Pleural based mass    Provider Requesting: Curt Bears  Provider Telephone: [phone]    Other Info: Rad exam in Epic

## 2019-07-02 NOTE — Telephone Encounter (Signed)
Received vm call from pt with questions. Returned call & after discussing with DR Julien Nordmann pt concerns about having biopsy, informed pt that biopsy can be done at same time of U/S thoracentesis per IR.  Pt understands now & is OK to schedule.  Message left with [facility] & Radiology dept.  Pt is also concerned about pain & has only 14 pain pills that Dr Julien Nordmann gave him & is taking 4-5 per day & will quickly run out. He is also concerned about being NPO 6 hours prior to PET & doesn't think he can go without pain med that long.  Informed to discuss with radiology to see if able to take with sip of water.  Informed Dr Julien Nordmann.

## 2019-07-03 ENCOUNTER — Ambulatory Visit (HOSPITAL_COMMUNITY)
Admission: RE | Admit: 2019-07-03 | Discharge: 2019-07-03 | Disposition: A | Discharge status: Home / Self Care | Payer: Medicare Other | Source: Ambulatory Visit | Attending: Internal Medicine | Admitting: Internal Medicine

## 2019-07-03 ENCOUNTER — Other Ambulatory Visit: Payer: Self-pay

## 2019-07-03 ENCOUNTER — Ambulatory Visit (HOSPITAL_COMMUNITY): Payer: Medicare Other

## 2019-07-03 ENCOUNTER — Ambulatory Visit (HOSPITAL_COMMUNITY)
Admission: RE | Admit: 2019-07-03 | Discharge: 2019-07-03 | Disposition: A | Discharge status: Home / Self Care | Payer: Medicare Other | Source: Ambulatory Visit | Attending: Radiology | Admitting: Radiology

## 2019-07-03 DIAGNOSIS — J90 Pleural effusion, not elsewhere classified: Secondary | ICD-10-CM | POA: Insufficient documentation

## 2019-07-03 DIAGNOSIS — C3490 Malignant neoplasm of unspecified part of unspecified bronchus or lung: Secondary | ICD-10-CM | POA: Diagnosis not present

## 2019-07-03 DIAGNOSIS — Z9889 Other specified postprocedural states: Secondary | ICD-10-CM

## 2019-07-03 MED ORDER — LIDOCAINE HCL 1 % IJ SOLN
INTRAMUSCULAR | Status: AC
  Filled 2019-07-03: qty 20

## 2019-07-03 NOTE — Procedures (Signed)
Ultrasound-guided diagnostic and therapeutic right thoracentesis performed yielding 500 cc of blood-tinged fluid. No immediate complications. Follow-up chest x-ray pending.The fluid was sent to the lab for cytology. Due to pt's right posterior chest discomfort only the above amount of fluid was removed today. EBL none.

## 2019-07-06 ENCOUNTER — Telehealth: Payer: Self-pay | Admitting: *Deleted

## 2019-07-06 ENCOUNTER — Encounter: Payer: Self-pay | Admitting: *Deleted

## 2019-07-06 ENCOUNTER — Other Ambulatory Visit: Payer: Self-pay | Admitting: Internal Medicine

## 2019-07-06 LAB — CYTOLOGY - NON PAP

## 2019-07-06 MED ORDER — HYDROCODONE-ACETAMINOPHEN 5-325 MG PO TABS: 1.0000 | ORAL_TABLET | Freq: Four times a day (QID) | ORAL | 0 refills | Status: DC | PRN

## 2019-07-06 NOTE — Telephone Encounter (Signed)
I called and updated on bx order in review and that they will get a call on the appt.

## 2019-07-06 NOTE — Telephone Encounter (Signed)
Mr Goulette left a message requesting a refill of Hydrocodone.

## 2019-07-06 NOTE — Progress Notes (Signed)
Oncology Nurse Navigator Documentation  Oncology Nurse Navigator Flowsheets 07/06/2019  Navigator Location [facility]  Navigator Encounter Type Other/Patient's bx has not been scheduled so I called radiology.  I was updated that bx order is in review at this time.   Treatment Phase Abnormal Scans  Barriers/Navigation Needs Coordination of Care  Interventions Coordination of Care  Acuity Level 2-Minimal Needs (1-2 Barriers Identified)  Coordination of Care Other  Time Spent with Patient 15

## 2019-07-08 ENCOUNTER — Ambulatory Visit (HOSPITAL_COMMUNITY)
Admission: RE | Admit: 2019-07-08 | Discharge: 2019-07-08 | Disposition: A | Discharge status: Home / Self Care | Payer: Medicare Other | Source: Ambulatory Visit | Attending: Internal Medicine | Admitting: Internal Medicine

## 2019-07-08 ENCOUNTER — Encounter: Payer: Self-pay | Admitting: *Deleted

## 2019-07-08 ENCOUNTER — Other Ambulatory Visit: Payer: Self-pay

## 2019-07-08 DIAGNOSIS — R222 Localized swelling, mass and lump, trunk: Secondary | ICD-10-CM | POA: Insufficient documentation

## 2019-07-08 DIAGNOSIS — C3490 Malignant neoplasm of unspecified part of unspecified bronchus or lung: Secondary | ICD-10-CM

## 2019-07-08 DIAGNOSIS — Z79899 Other long term (current) drug therapy: Secondary | ICD-10-CM | POA: Diagnosis not present

## 2019-07-08 DIAGNOSIS — R911 Solitary pulmonary nodule: Secondary | ICD-10-CM | POA: Diagnosis present

## 2019-07-08 LAB — GLUCOSE, CAPILLARY: Glucose-Capillary: 56 mg/dL — ABNORMAL LOW (ref 70–99)

## 2019-07-08 MED ORDER — GADOBUTROL 1 MMOL/ML IV SOLN
7.0000 mL | Freq: Once | INTRAVENOUS | Status: AC | PRN
  Administered 2019-07-08: 11:00:00 7 mL via INTRAVENOUS

## 2019-07-08 MED ORDER — FLUDEOXYGLUCOSE F - 18 (FDG) INJECTION
7.4200 | Freq: Once | INTRAVENOUS | Status: AC
  Administered 2019-07-08: 10:00:00 7.42 via INTRAVENOUS

## 2019-07-08 NOTE — Progress Notes (Signed)
Oncology Nurse Navigator Documentation  Oncology Nurse Navigator Flowsheets 07/08/2019  Navigator Location [facility]  Navigator Encounter Type Other/I updated Dr. Julien Nordmann patient bx was still not scheduled.  He asked that I send an email to Dr. Pascal Lux.  I did with Dr. Julien Nordmann cc'd.  I still have not heard from Dr. Pascal Lux so I reached back out to Dr. Julien Nordmann to see if he can contact him for an update.   Treatment Phase Abnormal Scans  Barriers/Navigation Needs Coordination of Care  Interventions Coordination of Care  Acuity Level 2-Minimal Needs (1-2 Barriers Identified)  Coordination of Care Other  Time Spent with Patient 30

## 2019-07-09 ENCOUNTER — Inpatient Hospital Stay: Payer: Medicare Other

## 2019-07-09 ENCOUNTER — Encounter: Payer: Self-pay | Admitting: Internal Medicine

## 2019-07-09 ENCOUNTER — Telehealth: Payer: Self-pay | Admitting: Internal Medicine

## 2019-07-09 ENCOUNTER — Other Ambulatory Visit: Payer: Self-pay

## 2019-07-09 ENCOUNTER — Inpatient Hospital Stay (HOSPITAL_BASED_OUTPATIENT_CLINIC_OR_DEPARTMENT_OTHER): Payer: Medicare Other | Admitting: Internal Medicine

## 2019-07-09 ENCOUNTER — Encounter: Payer: Self-pay | Admitting: *Deleted

## 2019-07-09 VITALS — BP 156/84 | HR 75 | Temp 97.4°F | Resp 18 | Ht 68.0 in | Wt 153.6 lb

## 2019-07-09 DIAGNOSIS — C3490 Malignant neoplasm of unspecified part of unspecified bronchus or lung: Secondary | ICD-10-CM | POA: Diagnosis present

## 2019-07-09 DIAGNOSIS — C7951 Secondary malignant neoplasm of bone: Secondary | ICD-10-CM

## 2019-07-09 DIAGNOSIS — G893 Neoplasm related pain (acute) (chronic): Secondary | ICD-10-CM | POA: Diagnosis not present

## 2019-07-09 DIAGNOSIS — C782 Secondary malignant neoplasm of pleura: Secondary | ICD-10-CM

## 2019-07-09 DIAGNOSIS — I700 Atherosclerosis of aorta: Secondary | ICD-10-CM | POA: Diagnosis not present

## 2019-07-09 DIAGNOSIS — J90 Pleural effusion, not elsewhere classified: Secondary | ICD-10-CM

## 2019-07-09 DIAGNOSIS — J910 Malignant pleural effusion: Secondary | ICD-10-CM | POA: Diagnosis not present

## 2019-07-09 MED ORDER — MORPHINE SULFATE ER 30 MG PO TBCR: 30.0000 mg | EXTENDED_RELEASE_TABLET | Freq: Two times a day (BID) | ORAL | 0 refills | Status: DC

## 2019-07-09 NOTE — Telephone Encounter (Signed)
Gave avs and calendar ° °

## 2019-07-09 NOTE — Progress Notes (Signed)
Oncology Nurse Navigator Documentation  Oncology Nurse Navigator Flowsheets 07/09/2019  Navigator Location [facility]  Navigator Encounter Type Other/I received an email from Dr. Pascal Lux saying patient was contacted about scheduling bx but wanted to wait until he spoke with Dr. Julien Nordmann.  Patient has an appt with Dr. Julien Nordmann today.  I have updated Dr. Julien Nordmann on information from radiology.    Treatment Phase Abnormal Scans  Barriers/Navigation Needs Coordination of Care  Interventions Coordination of Care  Acuity Level 2-Minimal Needs (1-2 Barriers Identified)  Coordination of Care Other  Time Spent with Patient 15

## 2019-07-09 NOTE — Progress Notes (Signed)
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  Tissue diagnosis is still pending  PRIOR THERAPY: None  CURRENT THERAPY: None  INTERVAL HISTORY: Mario Harmon 74 y.o. male returns to the clinic today for follow-up appointment.  The patient continues to have severe pain in the right side of the chest and he is currently using hydrocodone more frequently than normal.  The patient also has shortness of breath with exertion with cough and no hemoptysis.  He denied having any nausea, vomiting, diarrhea or constipation.  He has no headache or visual changes.  He lost few more pounds since his last visit.  He underwent ultrasound-guided right thoracentesis on 07/02/2018 but the final cytology was negative for malignancy.  He was supposed to have CT-guided core biopsy of one of the pleural-based masses but unfortunately miscommunication between the patient and the scheduling team this procedure was delayed.  He had a PET scan as well as MRI of the brain performed recently and he is here for evaluation and discussion of his scan results and further recommendation regarding his condition.   MEDICAL HISTORY:No past medical history on file.  ALLERGIES:  has No Known Allergies.  MEDICATIONS:  Current Outpatient Medications  Medication Sig Dispense Refill  . HYDROcodone-acetaminophen (NORCO/VICODIN) 5-325 MG tablet Take 1 tablet by mouth every 6 (six) hours as needed for moderate pain. 30 tablet 0  . magnesium citrate SOLN Take 296 mLs (1 Bottle total) by mouth once. (Patient not taking: Reported on 06/29/2019) 195 mL 0   No current facility-administered medications for this visit.    SURGICAL HISTORY: No past surgical history on file.  REVIEW OF  SYSTEMS:  Constitutional: positive for anorexia, fatigue and weight loss Eyes: negative Ears, nose, mouth, throat, and face: negative Respiratory: positive for cough, dyspnea on exertion and pleurisy/chest pain Cardiovascular: negative Gastrointestinal: negative Genitourinary:negative Integument/breast: negative Hematologic/lymphatic: negative Musculoskeletal:negative Neurological: negative Behavioral/Psych: negative Endocrine: negative Allergic/Immunologic: negative   PHYSICAL EXAMINATION: General appearance: alert, cooperative, fatigued and no distress Head: Normocephalic, without obvious abnormality, atraumatic Neck: no adenopathy, no JVD, supple, symmetrical, trachea midline and thyroid not enlarged, symmetric, no tenderness/mass/nodules Lymph nodes: Cervical, supraclavicular, and axillary nodes normal. Resp: clear to auscultation bilaterally Back: symmetric, no curvature. ROM normal. No CVA tenderness. Cardio: regular rate and rhythm, S1, S2 normal, no murmur, click, rub or gallop GI: soft, non-tender; bowel sounds normal; no masses,  no organomegaly Extremities: extremities normal, atraumatic, no cyanosis or edema Neurologic: Alert and oriented X 3, normal strength and tone. Normal symmetric reflexes. Normal coordination and gait  ECOG PERFORMANCE STATUS: 1 - Symptomatic but completely ambulatory  Blood pressure (!) 156/84, pulse 75, temperature (!) 97.4 F (36.3 C), temperature source Temporal, resp. rate 18, height 5\' 8"  (1.727 m), weight 153 lb 9.6 oz (69.7 kg), SpO2 97 %.  LABORATORY DATA: Lab Results  Component Value Date   WBC 9.5 06/27/2019   HGB 13.3 06/27/2019   HCT 41.8 06/27/2019   MCV 95.0 06/27/2019   PLT 402 (H) 06/27/2019      Chemistry      Component Value Date/Time   NA 140 06/27/2019 0916   K 4.2 06/27/2019 0916   CL 106 06/27/2019 0916   CO2 24 06/27/2019 0916   BUN 10 06/27/2019 0916   CREATININE  1.03 06/27/2019 0916      Component Value  Date/Time   CALCIUM 9.1 06/27/2019 0916   ALKPHOS 70 06/27/2019 1213   AST 25 06/27/2019 1213   ALT 25 06/27/2019 1213   BILITOT 0.6 06/27/2019 1213       RADIOGRAPHIC STUDIES: DG Chest 2 View  Result Date: 06/27/2019 CLINICAL DATA:  Chest pain. EXAM: CHEST - 2 VIEW COMPARISON:  02/19/2010; chest CT-02/21/2010 FINDINGS: Grossly unchanged cardiac silhouette and mediastinal contours. Interval development of an age-indeterminate likely partially loculated small to moderate-sized right-sided pleural effusion with associated right mid and lower lung heterogeneous/consolidative opacities and volume loss. The left hemithorax remains well aerated. No evidence of edema. No pneumothorax. No acute osseous abnormalities. IMPRESSION: Interval development of age-indeterminate small to moderate-sized partially loculated right-sided pleural effusion with associated right mid and lower lung opacities, atelectasis versus infiltrate. Correlation with more recent outside examinations (if available) is advised. Otherwise, further evaluation with contrast-enhanced chest CT could performed as indicated. Electronically Signed   By: Sandi Mariscal M.D.   On: 06/27/2019 09:01   CT Chest W Contrast  Result Date: 06/27/2019 CLINICAL DATA:  Evaluate pleural effusion. EXAM: CT CHEST WITH CONTRAST TECHNIQUE: Multidetector CT imaging of the chest was performed during intravenous contrast administration. CONTRAST:  107mL OMNIPAQUE IOHEXOL 300 MG/ML  SOLN COMPARISON:  None FINDINGS: Cardiovascular: The heart size is normal. No pericardial effusion identified. Mediastinum/Nodes: Enlarged right internal mammary lymph node measures 2.2 cm, image 41/3 within the anterior mediastinum there is a ovoid solid mass measuring 4.7 x 2.9 cm, image 71/3 thyroid gland, trachea, and esophagus demonstrate no significant findings. Lungs/Pleura: Moderate loculated right pleural effusion identified with extensive solid enhancing pleural based tumor. Index  mass within the right lateral costophrenic sulcus measures 6.5 x 3.1 cm, image 123/3. Index right lateral pleural based tumor overlying the mid lung measures 4.0 x 2.5 cm, image 80/3. Pleural tumor overlying the posterolateral right upper lobe measures 7.8 x 1.9 cm, image 59/3. Upper Abdomen: No acute abnormality. Musculoskeletal: No chest wall abnormality. No acute or significant osseous findings. Lytic lesion involving the lateral aspect of the right fourth and sixth ribs. IMPRESSION: 1. 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 In the absence of known malignancy findings are suspicious for primary bronchogenic carcinoma. Further investigation with PET-CT and tissue sampling advised. 2. Enlarged right internal mammary lymph node and anterior mediastinal mass compatible with metastatic adenopathy. 3. Lytic lesion involving the lateral aspect of the right fourth and sixth ribs is identified compatible with osseous metastasis. 4. Aortic atherosclerosis. Aortic Atherosclerosis (ICD10-I70.0). Electronically Signed   By: Kerby Moors M.D.   On: 06/27/2019 14:01   MR BRAIN W WO CONTRAST  Result Date: 07/08/2019 CLINICAL DATA:  Non-small cell lung cancer. Staging. Gait disturbance. EXAM: MRI HEAD WITHOUT AND WITH CONTRAST TECHNIQUE: Multiplanar, multiecho pulse sequences of the brain and surrounding structures were obtained without and with intravenous contrast. CONTRAST:  42mL GADAVIST GADOBUTROL 1 MMOL/ML IV SOLN COMPARISON:  02/21/2010 FINDINGS: Brain: Ordinary age related volume loss. Minimal chronic small-vessel ischemic change of the hemispheric white matter. No evidence of recent infarction. No cortical or large vessel territory infarction. No evidence of primary or metastatic mass lesion, recent hemorrhage, hydrocephalus or extra-axial collection. Low level dural enhancement is nonspecific and can be persistent from the  previous head injury of 20/11. Unlikely to be malignant. Vascular: Major vessels at the base of the brain show flow. Skull and upper cervical  spine: Negative Sinuses/Orbits: Clear/normal Other: None IMPRESSION: No evidence of metastatic disease. Mild age related volume loss and small-vessel change of the white matter. Diffuse dural enhancement. This is most likely persistent from the previous head injury in 2011. Malignant etiology would be unlikely. Electronically Signed   By: Nelson Chimes M.D.   On: 07/08/2019 15:05   NM PET Image Initial (PI) Skull Base To Thigh  Result Date: 07/08/2019 CLINICAL DATA:  Initial treatment strategy for solitary pulmonary nodule. EXAM: NUCLEAR MEDICINE PET SKULL BASE TO THIGH TECHNIQUE: 7.4 mCi F-18 FDG was injected intravenously. Full-ring PET imaging was performed from the skull base to thigh after the radiotracer. CT data was obtained and used for attenuation correction and anatomic localization. Fasting blood glucose: 65 mg/dl COMPARISON:  Chest radiograph 07/03/2019 CT chest 06/27/2019 FINDINGS: Mediastinal blood pool activity: SUV max 2.2 Liver activity: SUV max NA NECK: Mildly asymmetric activity along the right glottis and ariepiglottic fold, maximum SUV 4.2, no CT correlate, probably physiologic/incidental. Incidental CT findings: none CHEST: Multiple large right pleural masses, with least 2 sites of invasion of the right chest wall. A mass along the anterior mediastinal border of the pleura measures 3.1 by 4.8 cm on image 78/4 with maximum SUV 17.1. A hypermetabolic mass extending through the third intercostal space and eroding the right fourth rib measures approximately 2.8 cm in thickness with maximum SUV 22.3. There is also a mass invading through the fifth intercostal space with erosion of the right sixth rib. Small right pleural effusion. There is a mass along the right lateral hemidiaphragm inseparable from the diaphragm and indenting the margin of the liver, this  mass has a maximum SUV of 26.1. The previously described anterior mediastinal mass and right internal mammary lymph node may represent extension of pleural disease given that both abut the pleura. There is subtle accentuated activity anteriorly the left fourth rib along the costochondral junction, maximum SUV 3.2, without a definite adjacent pleural mass, an without a definite well-defined fracture. Incidental CT findings: none ABDOMEN/PELVIS: No significant abnormal hypermetabolic activity in this region. Incidental CT findings: None SKELETON: Small focus of accentuated activity along the inferior margin of the right T11 vertebra, maximum SUV 3.9, no CT correlate, probably incidental but merits surveillance. Right rib erosions due to pleural masses as noted above. Incidental CT findings: none IMPRESSION: 1. Multiple large hypermetabolic right pleural masses with at least 2 sites of chest wall invasion with associated rib erosions, compatible with either primary pleural malignancy, peripheral lung cancer with early spread to the pleura, or lymphoproliferative disease with pleural involvement. Anterior mediastinal mass and right internal mammary lymph node may represent local invasion from the pleural disease. Tissue diagnosis recommended. 2. Subtle activity anteriorly in the left fourth rib along the costochondral junction. This low-grade activity could be from a subtle nondisplaced occult fracture. There is no underlying CT abnormality or pleural lesion in this vicinity. Regional surveillance is recommended. 3. Mildly asymmetric activity in the right glottis without appreciable CT abnormality, probably incidental and physiologic. Electronically Signed   By: Van Clines M.D.   On: 07/08/2019 11:59   DG Chest Port 1 View  Result Date: 07/03/2019 CLINICAL DATA:  Thoracentesis. EXAM: PORTABLE CHEST 1 VIEW COMPARISON:  CT 06/27/2019.  Chest x-ray 06/27/2019. FINDINGS: Mediastinum and hilar structures normal.  Heart size normal. Right pleural masses/loculated pleural effusion again noted without interim change. Reference is made to CT report of 06/27/2019. No new abnormality identified. No acute infiltrate noted. No pneumothorax. Right rib lesions  best identified by prior CT. IMPRESSION: Right pleural masses/loculated pleural effusion again noted without interim change. Reference is made to prior CT report of 06/27/2019 for further discussion. No new abnormality or acute infiltrate identified. Right rib lesions best identified by prior CT. Electronically Signed   By: Marcello Moores  Register   On: 07/03/2019 12:18   US THORACENTESIS ASP PLEURAL SPACE W/IMG GUIDE  Result Date: 07/03/2019 INDICATION: Patient with history of right pleural based masses, adenopathy, right rib lytic lesions, right pleural effusion. Request made for diagnostic and therapeutic right thoracentesis. EXAM: ULTRASOUND GUIDED DIAGNOSTIC AND THERAPEUTIC RIGHT THORACENTESIS MEDICATIONS: None COMPLICATIONS: None immediate. PROCEDURE: An ultrasound guided thoracentesis was thoroughly discussed with the patient and questions answered. The benefits, risks, alternatives and complications were also discussed. The patient understands and wishes to proceed with the procedure. Written consent was obtained. Ultrasound was performed to localize and mark an adequate pocket of fluid in the right chest. The area was then prepped and draped in the normal sterile fashion. 1% Lidocaine was used for local anesthesia. Under ultrasound guidance a 6 Fr Safe-T-Centesis catheter was introduced. Thoracentesis was performed. The catheter was removed and a dressing applied. FINDINGS: A total of approximately 500 cc of blood-tinged fluid was removed. Samples were sent to the laboratory as requested by the clinical team. IMPRESSION: Successful ultrasound guided diagnostic and therapeutic right thoracentesis yielding 500 cc of pleural fluid. Read by: Rowe Robert, PA-C Electronically  Signed   By: Aletta Edouard M.D.   On: 07/03/2019 12:00    ASSESSMENT AND PLAN: This is a very pleasant 74 years old white male with highly suspicious metastatic malignancy involving the right side of the chest with right pleural effusion as well as multiple large pleural-based masses as well as chest wall invasion with rib destruction.  There is also hypermetabolic mediastinal lymphadenopathy The patient underwent ultrasound-guided right thoracentesis but the final cytology was not conclusive for malignancy. I discussed the PET scan results with the patient today and showed him the images.  I recommended for him to proceed with CT-guided core biopsy of one of the hypermetabolic pleural-based masses. For pain management I started the patient on MS Contin 30 mg p.o. every 12 hours and he will continue on hydrocodone for breakthrough pain.  I also referred the patient to radiation oncology for evaluation and consideration of palliative radiotherapy to the painful right sided pleural-based and chest wall invasive tumor. I will see him back for follow-up visit in around 10 days for evaluation and discussion of his treatment options based on the biopsy results. He was advised to call immediately if he has any other concerning symptoms in the interval. The patient voices understanding of current disease status and treatment options and is in agreement with the current care plan.  All questions were answered. The patient knows to call the clinic with any problems, questions or concerns. We can certainly see the patient much sooner if necessary.  Disclaimer: This note was dictated with voice recognition software. Similar sounding words can inadvertently be transcribed and may not be corrected upon review.

## 2019-07-11 ENCOUNTER — Ambulatory Visit (HOSPITAL_COMMUNITY)
Admission: RE | Admit: 2019-07-11 | Discharge: 2019-07-11 | Disposition: A | Discharge status: Home / Self Care | Payer: Medicare Other | Source: Ambulatory Visit | Attending: Internal Medicine | Admitting: Internal Medicine

## 2019-07-11 DIAGNOSIS — Z01812 Encounter for preprocedural laboratory examination: Secondary | ICD-10-CM | POA: Insufficient documentation

## 2019-07-11 DIAGNOSIS — Z20822 Contact with and (suspected) exposure to covid-19: Secondary | ICD-10-CM | POA: Insufficient documentation

## 2019-07-11 LAB — SARS CORONAVIRUS 2 (TAT 6-24 HRS): SARS Coronavirus 2: NEGATIVE

## 2019-07-13 ENCOUNTER — Telehealth: Payer: Self-pay | Admitting: *Deleted

## 2019-07-13 ENCOUNTER — Other Ambulatory Visit: Payer: Self-pay | Admitting: Student

## 2019-07-13 NOTE — Telephone Encounter (Signed)
Received vm message from patient. He states he needs a refill on his hydrocodone 5/325.  This was last filled on 07/06/19 for #30  TCT patient and spoke with him.   He is taking the Morphine long acting 30 mg every 12 hours but states he still needs to take the hydrocodone 4-5 x a day for breakthrough pain.  He states some days are worse than others.  He has about 4 tablets left of the hydrocodone. He states  He tries to not take as much but the pain gets pretty much according to patient. Dr. Julien Nordmann notified

## 2019-07-14 ENCOUNTER — Telehealth: Payer: Self-pay | Admitting: Medical Oncology

## 2019-07-14 ENCOUNTER — Ambulatory Visit (HOSPITAL_COMMUNITY)
Admission: RE | Admit: 2019-07-14 | Discharge: 2019-07-14 | Disposition: A | Discharge status: Home / Self Care | Payer: Medicare Other | Source: Ambulatory Visit | Attending: Internal Medicine | Admitting: Internal Medicine

## 2019-07-14 ENCOUNTER — Other Ambulatory Visit: Payer: Self-pay

## 2019-07-14 ENCOUNTER — Other Ambulatory Visit: Payer: Self-pay | Admitting: Internal Medicine

## 2019-07-14 DIAGNOSIS — C459 Mesothelioma, unspecified: Secondary | ICD-10-CM | POA: Diagnosis not present

## 2019-07-14 DIAGNOSIS — J90 Pleural effusion, not elsewhere classified: Secondary | ICD-10-CM | POA: Diagnosis present

## 2019-07-14 DIAGNOSIS — Z85118 Personal history of other malignant neoplasm of bronchus and lung: Secondary | ICD-10-CM | POA: Insufficient documentation

## 2019-07-14 DIAGNOSIS — I700 Atherosclerosis of aorta: Secondary | ICD-10-CM | POA: Diagnosis not present

## 2019-07-14 DIAGNOSIS — C7951 Secondary malignant neoplasm of bone: Secondary | ICD-10-CM | POA: Diagnosis not present

## 2019-07-14 DIAGNOSIS — Z79899 Other long term (current) drug therapy: Secondary | ICD-10-CM | POA: Insufficient documentation

## 2019-07-14 DIAGNOSIS — R222 Localized swelling, mass and lump, trunk: Secondary | ICD-10-CM | POA: Diagnosis not present

## 2019-07-14 LAB — CBC
HCT: 39.5 % (ref 39.0–52.0)
Hemoglobin: 12.5 g/dL — ABNORMAL LOW (ref 13.0–17.0)
MCH: 29.6 pg (ref 26.0–34.0)
MCHC: 31.6 g/dL (ref 30.0–36.0)
MCV: 93.4 fL (ref 80.0–100.0)
Platelets: 453 10*3/uL — ABNORMAL HIGH (ref 150–400)
RBC: 4.23 MIL/uL (ref 4.22–5.81)
RDW: 12.9 % (ref 11.5–15.5)
WBC: 8.2 10*3/uL (ref 4.0–10.5)
nRBC: 0 % (ref 0.0–0.2)

## 2019-07-14 LAB — PROTIME-INR
INR: 1.1 (ref 0.8–1.2)
Prothrombin Time: 14.1 seconds (ref 11.4–15.2)

## 2019-07-14 MED ORDER — HYDROCODONE-ACETAMINOPHEN 5-325 MG PO TABS
1.0000 | ORAL_TABLET | ORAL | Status: DC | PRN
  Administered 2019-07-14: 13:00:00 1 via ORAL
  Filled 2019-07-14: qty 1

## 2019-07-14 MED ORDER — FENTANYL CITRATE (PF) 100 MCG/2ML IJ SOLN
INTRAMUSCULAR | Status: AC | PRN
  Administered 2019-07-14 (×3): 50 ug via INTRAVENOUS

## 2019-07-14 MED ORDER — MIDAZOLAM HCL 2 MG/2ML IJ SOLN
INTRAMUSCULAR | Status: AC | PRN
  Administered 2019-07-14 (×2): 1 mg via INTRAVENOUS
  Administered 2019-07-14: 0.5 mg via INTRAVENOUS

## 2019-07-14 MED ORDER — LIDOCAINE HCL 1 % IJ SOLN
INTRAMUSCULAR | Status: AC
  Filled 2019-07-14: qty 20

## 2019-07-14 MED ORDER — FENTANYL CITRATE (PF) 100 MCG/2ML IJ SOLN
INTRAMUSCULAR | Status: AC
  Filled 2019-07-14: qty 4

## 2019-07-14 MED ORDER — HYDROCODONE-ACETAMINOPHEN 5-325 MG PO TABS: 1.0000 | ORAL_TABLET | Freq: Four times a day (QID) | ORAL | 0 refills | Status: DC | PRN

## 2019-07-14 MED ORDER — SODIUM CHLORIDE 0.9 % IV SOLN: INTRAVENOUS | Status: DC

## 2019-07-14 MED ORDER — MIDAZOLAM HCL 2 MG/2ML IJ SOLN
INTRAMUSCULAR | Status: AC
  Filled 2019-07-14: qty 4

## 2019-07-14 NOTE — Telephone Encounter (Signed)
2nd request for refill Hydrocodone. He is taking 4-5/day.

## 2019-07-14 NOTE — Discharge Instructions (Signed)

## 2019-07-14 NOTE — Procedures (Signed)
  Procedure: CT core R pleural mass   EBL:   minimal Complications:  none immediate  See full dictation in [facility].  Dillard Cannon MD Main # [phone] Pager  [phone]

## 2019-07-14 NOTE — H&P (Signed)
Chief Complaint: Patient was seen in consultation today for right chest wall mass biopsy at the request of [facility]  Referring Physician(s): Mohamed,Mohamed  Supervising Physician: Arne Cleveland  Patient Status: [facility] - Out-pt  History of Present Illness: Mario Harmon is a 74 y.o. male   Rt chest pain for few weeks Worsening pain; wt loss; SOB Covid neg x 2 PMD performs CXR 06/27/19 showing Rt effusion and lung opacities  1/2 ZD:[id number]: 1. 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 In the absence of known malignancy findings are suspicious for primary bronchogenic carcinoma. Further investigation with PET-CT and tissue sampling advised. 2. Enlarged right internal mammary lymph node and anterior mediastinal mass compatible with metastatic adenopathy. 3. Lytic lesion involving the lateral aspect of the right fourth and sixth ribs is identified compatible with osseous metastasis. 4. Aortic atherosclerosis.  1/8: Thoracentesis: 500 cc; no malig cells  07/08/19: PET: IMPRESSION: 1. Multiple large hypermetabolic right pleural masses with at least 2 sites of chest wall invasion with associated rib erosions, compatible with either primary pleural malignancy, peripheral lung cancer with early spread to the pleura, or lymphoproliferative disease with pleural involvement. Anterior mediastinal mass and right internal mammary lymph node may represent local invasion from the pleural disease. Tissue diagnosis recommended. 2. Subtle activity anteriorly in the left fourth rib along the costochondral junction. This low-grade activity could be from a subtle nondisplaced occult fracture. There is no underlying CT abnormality or pleural lesion in this vicinity. Regional surveillance is recommended. 3. Mildly asymmetric activity in the right glottis without appreciable CT abnormality,  probably incidental and physiologic.  Now scheduled for right chest wall mass biopsy per Dr Julien Nordmann    No past medical history on file.  No past surgical history on file.  Allergies: Patient has no known allergies.  Medications: Prior to Admission medications   Medication Sig Start Date End Date Taking? Authorizing Provider  HYDROcodone-acetaminophen (NORCO/VICODIN) 5-325 MG tablet Take 1 tablet by mouth every 6 (six) hours as needed for moderate pain. 07/06/19   Curt Bears, MD  magnesium citrate SOLN Take 296 mLs (1 Bottle total) by mouth once. Patient not taking: Reported on 06/29/2019 12/10/15   Gareth Morgan, MD  morphine (MS CONTIN) 30 MG 12 hr tablet Take 1 tablet (30 mg total) by mouth every 12 (twelve) hours. 07/09/19   Curt Bears, MD     No family history on file.  Social History   Socioeconomic History  . Marital status: Single    Spouse name: Not on file  . Number of children: Not on file  . Years of education: Not on file  . Highest education level: Not on file  Occupational History  . Not on file  Tobacco Use  . Smoking status: Never Smoker  . Smokeless tobacco: Never Used  Substance and Sexual Activity  . Alcohol use: Not Currently  . Drug use: Never  . Sexual activity: Not on file  Other Topics Concern  . Not on file  Social History Narrative  . Not on file   Social Determinants of Health   Financial Resource Strain:   . Difficulty of Paying Living Expenses: Not on file  Food Insecurity:   . Worried About Charity fundraiser in the Last Year: Not on file  . Ran Out of Food in the Last Year: Not on file  Transportation Needs:   . Lack of Transportation (Medical): Not on file  .  Lack of Transportation (Non-Medical): Not on file  Physical Activity:   . Days of Exercise per Week: Not on file  . Minutes of Exercise per Session: Not on file  Stress:   . Feeling of Stress : Not on file  Social Connections:   . Frequency of Communication  with Friends and Family: Not on file  . Frequency of Social Gatherings with Friends and Family: Not on file  . Attends Religious Services: Not on file  . Active Member of Clubs or Organizations: Not on file  . Attends Archivist Meetings: Not on file  . Marital Status: Not on file    Review of Systems: A 12 point ROS discussed and pertinent positives are indicated in the HPI above.  All other systems are negative.  Review of Systems  Constitutional: Positive for fatigue and unexpected weight change.  Respiratory: Positive for shortness of breath.   Cardiovascular: Positive for chest pain.  Gastrointestinal: Negative for abdominal pain.  Neurological: Positive for weakness.  Psychiatric/Behavioral: Negative for behavioral problems and confusion.    Vital Signs: BP 132/87   Pulse 90   Temp 98.3 F (36.8 C) (Oral)   Resp 18   Ht 5\' 8"  (1.727 m)   Wt 145 lb 8 oz (66 kg)   SpO2 97%   BMI 22.12 kg/m   Physical Exam Vitals reviewed.  Cardiovascular:     Rate and Rhythm: Normal rate and regular rhythm.     Heart sounds: Normal heart sounds.  Pulmonary:     Effort: Pulmonary effort is normal.     Breath sounds: Normal breath sounds.  Abdominal:     Palpations: Abdomen is soft.  Musculoskeletal:        General: Normal range of motion.  Skin:    General: Skin is warm and dry.  Neurological:     Mental Status: He is alert and oriented to person, place, and time.  Psychiatric:        Mood and Affect: Mood normal.        Behavior: Behavior normal.        Thought Content: Thought content normal.        Judgment: Judgment normal.     Imaging: DG Chest 2 View  Result Date: 06/27/2019 CLINICAL DATA:  Chest pain. EXAM: CHEST - 2 VIEW COMPARISON:  02/19/2010; chest CT-02/21/2010 FINDINGS: Grossly unchanged cardiac silhouette and mediastinal contours. Interval development of an age-indeterminate likely partially loculated small to moderate-sized right-sided pleural  effusion with associated right mid and lower lung heterogeneous/consolidative opacities and volume loss. The left hemithorax remains well aerated. No evidence of edema. No pneumothorax. No acute osseous abnormalities. IMPRESSION: Interval development of age-indeterminate small to moderate-sized partially loculated right-sided pleural effusion with associated right mid and lower lung opacities, atelectasis versus infiltrate. Correlation with more recent outside examinations (if available) is advised. Otherwise, further evaluation with contrast-enhanced chest CT could performed as indicated. Electronically Signed   By: Sandi Mariscal M.D.   On: 06/27/2019 09:01   CT Chest W Contrast  Result Date: 06/27/2019 CLINICAL DATA:  Evaluate pleural effusion. EXAM: CT CHEST WITH CONTRAST TECHNIQUE: Multidetector CT imaging of the chest was performed during intravenous contrast administration. CONTRAST:  53mL OMNIPAQUE IOHEXOL 300 MG/ML  SOLN COMPARISON:  None FINDINGS: Cardiovascular: The heart size is normal. No pericardial effusion identified. Mediastinum/Nodes: Enlarged right internal mammary lymph node measures 2.2 cm, image 41/3 within the anterior mediastinum there is a ovoid solid mass measuring 4.7 x 2.9 cm, image  71/3 thyroid gland, trachea, and esophagus demonstrate no significant findings. Lungs/Pleura: Moderate loculated right pleural effusion identified with extensive solid enhancing pleural based tumor. Index mass within the right lateral costophrenic sulcus measures 6.5 x 3.1 cm, image 123/3. Index right lateral pleural based tumor overlying the mid lung measures 4.0 x 2.5 cm, image 80/3. Pleural tumor overlying the posterolateral right upper lobe measures 7.8 x 1.9 cm, image 59/3. Upper Abdomen: No acute abnormality. Musculoskeletal: No chest wall abnormality. No acute or significant osseous findings. Lytic lesion involving the lateral aspect of the right fourth and sixth ribs. IMPRESSION: 1. Loculated right  pleural effusion with multifocal solid enhancing pleural based tumors compatible with pleural based metastasis. In the absence of known malignancy findings are suspicious for primary bronchogenic carcinoma. Further investigation with PET-CT and tissue sampling advised. 2. Enlarged right internal mammary lymph node and anterior mediastinal mass compatible with metastatic adenopathy. 3. Lytic lesion involving the lateral aspect of the right fourth and sixth ribs is identified compatible with osseous metastasis. 4. Aortic atherosclerosis. Aortic Atherosclerosis (ICD10-I70.0). Electronically Signed   By: Kerby Moors M.D.   On: 06/27/2019 14:01   MR BRAIN W WO CONTRAST  Result Date: 07/08/2019 CLINICAL DATA:  Non-small cell lung cancer. Staging. Gait disturbance. EXAM: MRI HEAD WITHOUT AND WITH CONTRAST TECHNIQUE: Multiplanar, multiecho pulse sequences of the brain and surrounding structures were obtained without and with intravenous contrast. CONTRAST:  71mL GADAVIST GADOBUTROL 1 MMOL/ML IV SOLN COMPARISON:  02/21/2010 FINDINGS: Brain: Ordinary age related volume loss. Minimal chronic small-vessel ischemic change of the hemispheric white matter. No evidence of recent infarction. No cortical or large vessel territory infarction. No evidence of primary or metastatic mass lesion, recent hemorrhage, hydrocephalus or extra-axial collection. Low level dural enhancement is nonspecific and can be persistent from the previous head injury of 20/11. Unlikely to be malignant. Vascular: Major vessels at the base of the brain show flow. Skull and upper cervical spine: Negative Sinuses/Orbits: Clear/normal Other: None IMPRESSION: No evidence of metastatic disease. Mild age related volume loss and small-vessel change of the white matter. Diffuse dural enhancement. This is most likely persistent from the previous head injury in 2011. Malignant etiology would be unlikely. Electronically Signed   By: Nelson Chimes M.D.   On:  07/08/2019 15:05   NM PET Image Initial (PI) Skull Base To Thigh  Result Date: 07/08/2019 CLINICAL DATA:  Initial treatment strategy for solitary pulmonary nodule. EXAM: NUCLEAR MEDICINE PET SKULL BASE TO THIGH TECHNIQUE: 7.4 mCi F-18 FDG was injected intravenously. Full-ring PET imaging was performed from the skull base to thigh after the radiotracer. CT data was obtained and used for attenuation correction and anatomic localization. Fasting blood glucose: 65 mg/dl COMPARISON:  Chest radiograph 07/03/2019 CT chest 06/27/2019 FINDINGS: Mediastinal blood pool activity: SUV max 2.2 Liver activity: SUV max NA NECK: Mildly asymmetric activity along the right glottis and ariepiglottic fold, maximum SUV 4.2, no CT correlate, probably physiologic/incidental. Incidental CT findings: none CHEST: Multiple large right pleural masses, with least 2 sites of invasion of the right chest wall. A mass along the anterior mediastinal border of the pleura measures 3.1 by 4.8 cm on image 78/4 with maximum SUV 17.1. A hypermetabolic mass extending through the third intercostal space and eroding the right fourth rib measures approximately 2.8 cm in thickness with maximum SUV 22.3. There is also a mass invading through the fifth intercostal space with erosion of the right sixth rib. Small right pleural effusion. There is a mass along the right  lateral hemidiaphragm inseparable from the diaphragm and indenting the margin of the liver, this mass has a maximum SUV of 26.1. The previously described anterior mediastinal mass and right internal mammary lymph node may represent extension of pleural disease given that both abut the pleura. There is subtle accentuated activity anteriorly the left fourth rib along the costochondral junction, maximum SUV 3.2, without a definite adjacent pleural mass, an without a definite well-defined fracture. Incidental CT findings: none ABDOMEN/PELVIS: No significant abnormal hypermetabolic activity in this  region. Incidental CT findings: None SKELETON: Small focus of accentuated activity along the inferior margin of the right T11 vertebra, maximum SUV 3.9, no CT correlate, probably incidental but merits surveillance. Right rib erosions due to pleural masses as noted above. Incidental CT findings: none IMPRESSION: 1. Multiple large hypermetabolic right pleural masses with at least 2 sites of chest wall invasion with associated rib erosions, compatible with either primary pleural malignancy, peripheral lung cancer with early spread to the pleura, or lymphoproliferative disease with pleural involvement. Anterior mediastinal mass and right internal mammary lymph node may represent local invasion from the pleural disease. Tissue diagnosis recommended. 2. Subtle activity anteriorly in the left fourth rib along the costochondral junction. This low-grade activity could be from a subtle nondisplaced occult fracture. There is no underlying CT abnormality or pleural lesion in this vicinity. Regional surveillance is recommended. 3. Mildly asymmetric activity in the right glottis without appreciable CT abnormality, probably incidental and physiologic. Electronically Signed   By: Van Clines M.D.   On: 07/08/2019 11:59   DG Chest Port 1 View  Result Date: 07/03/2019 CLINICAL DATA:  Thoracentesis. EXAM: PORTABLE CHEST 1 VIEW COMPARISON:  CT 06/27/2019.  Chest x-ray 06/27/2019. FINDINGS: Mediastinum and hilar structures normal. Heart size normal. Right pleural masses/loculated pleural effusion again noted without interim change. Reference is made to CT report of 06/27/2019. No new abnormality identified. No acute infiltrate noted. No pneumothorax. Right rib lesions best identified by prior CT. IMPRESSION: Right pleural masses/loculated pleural effusion again noted without interim change. Reference is made to prior CT report of 06/27/2019 for further discussion. No new abnormality or acute infiltrate identified. Right rib  lesions best identified by prior CT. Electronically Signed   By: Marcello Moores  Register   On: 07/03/2019 12:18   US THORACENTESIS ASP PLEURAL SPACE W/IMG GUIDE  Result Date: 07/03/2019 INDICATION: Patient with history of right pleural based masses, adenopathy, right rib lytic lesions, right pleural effusion. Request made for diagnostic and therapeutic right thoracentesis. EXAM: ULTRASOUND GUIDED DIAGNOSTIC AND THERAPEUTIC RIGHT THORACENTESIS MEDICATIONS: None COMPLICATIONS: None immediate. PROCEDURE: An ultrasound guided thoracentesis was thoroughly discussed with the patient and questions answered. The benefits, risks, alternatives and complications were also discussed. The patient understands and wishes to proceed with the procedure. Written consent was obtained. Ultrasound was performed to localize and mark an adequate pocket of fluid in the right chest. The area was then prepped and draped in the normal sterile fashion. 1% Lidocaine was used for local anesthesia. Under ultrasound guidance a 6 Fr Safe-T-Centesis catheter was introduced. Thoracentesis was performed. The catheter was removed and a dressing applied. FINDINGS: A total of approximately 500 cc of blood-tinged fluid was removed. Samples were sent to the laboratory as requested by the clinical team. IMPRESSION: Successful ultrasound guided diagnostic and therapeutic right thoracentesis yielding 500 cc of pleural fluid. Read by: Rowe Robert, PA-C Electronically Signed   By: Aletta Edouard M.D.   On: 07/03/2019 12:00    Labs:  CBC: Recent Labs  06/27/19 0916  WBC 9.5  HGB 13.3  HCT 41.8  PLT 402*    COAGS: No results for input(s): INR, APTT in the last 8760 hours.  BMP: Recent Labs    06/27/19 0916  NA 140  K 4.2  CL 106  CO2 24  GLUCOSE 116*  BUN 10  CALCIUM 9.1  CREATININE 1.03  GFRNONAA >60  GFRAA >60    LIVER FUNCTION TESTS: Recent Labs    06/27/19 1213  BILITOT 0.6  AST 25  ALT 25  ALKPHOS 70  PROT 8.0    ALBUMIN 3.7    TUMOR MARKERS: No results for input(s): AFPTM, CEA, CA199, CHROMGRNA in the last 8760 hours.  Assessment and Plan:  Right chest pain and sob Rt effusion; Thora 1/8: 500 cc -- no malig cells PET revealing + pleural masses and rib erosions Now scheduled for right chest wall mass biopsy Risks and benefits of CT guided right chest wall mass biopsy was discussed with the patient including, but not limited to bleeding, hemoptysis, respiratory failure requiring intubation, infection, pneumothorax requiring chest tube placement, stroke from air embolism or even death.  All of the patient's questions were answered and the patient is agreeable to proceed. Consent signed and in chart.   Thank you for this interesting consult.  I greatly enjoyed meeting RANDELL TEARE and look forward to participating in their care.  A copy of this report was sent to the requesting provider on this date.  Electronically Signed: Lavonia Drafts, PA-C 07/14/2019, 9:44 AM   I spent a total of  30 Minutes   in face to face in clinical consultation, greater than 50% of which was counseling/coordinating care for right chest wall mass bx

## 2019-07-15 ENCOUNTER — Ambulatory Visit
Admission: RE | Admit: 2019-07-15 | Discharge: 2019-07-15 | Disposition: A | Discharge status: Home / Self Care | Payer: Medicare Other | Source: Ambulatory Visit | Attending: Internal Medicine | Admitting: Internal Medicine

## 2019-07-15 ENCOUNTER — Ambulatory Visit
Admission: RE | Admit: 2019-07-15 | Discharge: 2019-07-15 | Disposition: A | Discharge status: Home / Self Care | Payer: Medicare Other | Source: Ambulatory Visit | Attending: Radiation Oncology | Admitting: Radiation Oncology

## 2019-07-15 ENCOUNTER — Encounter: Payer: Self-pay | Admitting: Radiation Oncology

## 2019-07-15 DIAGNOSIS — C782 Secondary malignant neoplasm of pleura: Secondary | ICD-10-CM

## 2019-07-15 MED ORDER — OXYCODONE HCL 5 MG PO TABS: 5.0000 mg | ORAL_TABLET | ORAL | 0 refills | Status: DC | PRN

## 2019-07-15 NOTE — Progress Notes (Addendum)
Radiation Oncology         [phone] ________________________________  Initial Outpatient Consultation - Conducted via telephone due to current COVID-19 concerns for limiting patient exposure  I spoke with the patient to conduct this consult visit via telephone to spare the patient unnecessary potential exposure in the healthcare setting during the current COVID-19 pandemic. The patient was notified in advance and was offered a [place] meeting to allow for face to face communication but unfortunately reported that they did not have the appropriate resources/technology to support such a visit and instead preferred to proceed with a telephone consult.  ________________________________      Addendum: Please see the note from Shona Simpson, PA-C from today's visit for more details of today's encounter.  I have personally performed a face to face diagnostic evaluation on this patient and devised the following assessment and plan.   The patient underwent evaluation today for his recent finding of multifocal pleural-based metastases.  The patient is having significant pain over a diffuse area in the right chest region.  Biopsy results are pending.  Based on his current information, the patient appears to be a good candidate for palliative radiation treatment to the right chest.  We discussed the possible benefit of treatment as well as risks and side effects.  All of his questions were answered and the patient is eager to proceed with treatment.  He will undergo simulation tomorrow for treatment planning.  If this plan needs to be adjusted based on his pathologic findings, then we will proceed accordingly.  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   HISTORY OF PRESENT ILLNESS: Mario Harmon is a 74 y.o. male seen at the request of Dr. Julien Nordmann for newly noted pleural disease of unknown primary. He was in his usual state of health and cycles up to 80 miles a week, exercises outside and does many projects. About 5 weeks ago he thought he had a pulled muscle in his upper right back. Unfortunately his symptoms continued to progress and in the last 2-3 weeks he has developed severe right back and side pain in the thorax. In the last few days this is also noticeable in the front of the chest. He proceeded with evaluation in the ED and a CXR on 06/27/19 revealed a right sided pleural effusion and mid and lower lung opacities. A CT with contrast revealed enlarged right internal mammary node measuring 2.2 cm, a 4.7 x 2.9 cm mediastinal mass, a mass in the right lateral costophrenic sulcus measuring 6.5 x 3.1 cm, a plural based tumor overlying the mid lung measuring 4 x 2.5 cm, and a pleural tumor overlying the posterolateral right upper lobe measuring 7.8 x 1.9 cm. He underwent a ultrasound thoracentesis and the cytology showed lymphocytes. A PET on 07/08/19 multiple hypermetabolic right pleural masses with at least 2 sites of chest wall invasion and associated rib erosions, and subtle activity anteriorly in the left fourth rib along the costochondral junction was seen. Mild asymmetric right glottis activity was noted and favored to be physiologic. An MRI of the brain was negative for disease on 07/08/19. He has undergone a biopsy on 07/14/19  and pathology is pending at this time. He's started Morphine ER, and Norco with only minimal relief and he's contacted by phone  to discuss palliative radiotherapy.    PREVIOUS RADIATION THERAPY: No   PAST MEDICAL HISTORY: History reviewed. No pertinent past medical history.     PAST SURGICAL HISTORY:History reviewed. No pertinent surgical  history.   FAMILY HISTORY: History reviewed. No pertinent family history.   SOCIAL HISTORY:  Social History   Socioeconomic History  . Marital status: Married    Spouse name: Not on file  . Number of children: Not on file  . Years of education: Not on file  . Highest education level: Not on file  Occupational History  . Occupation: retired    Comment: Mudlogger from [employer]  . Smoking status: Light Tobacco Smoker    Types: Pipe  . Smokeless tobacco: Never Used  . Tobacco comment: many years ago he smoked a pipe.  Substance and Sexual Activity  . Alcohol use: Not Currently  . Drug use: Never  . Sexual activity: Not on file  Other Topics Concern  . Not on file  Social History Narrative   The patient is married and lives in [place]. He does not have any children. His sister also plays an active role in his care. 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   Social Determinants of Health   Financial Resource Strain:   . Difficulty of Paying Living Expenses: Not on file  Food Insecurity:   . Worried About Charity fundraiser in the Last Year: Not on file  . Ran Out of Food in the Last Year: Not on file  Transportation Needs:   . Lack of Transportation (Medical): Not on file  . Lack of Transportation (Non-Medical): Not on file  Physical Activity:   . Days of Exercise per Week: Not on file  . Minutes of Exercise per Session: Not on file  Stress:   . Feeling of Stress : Not on file  Social Connections:   . Frequency of Communication with Friends and Family: Not on file  . Frequency of Social Gatherings with Friends and Family: Not on file  . Attends Religious Services: Not on file  . Active Member of Clubs or Organizations: Not on file  . Attends Archivist Meetings: Not on file  . Marital Status: Not on file  Intimate Partner  Violence:   . Fear of Current or Ex-Partner: Not on file  . Emotionally Abused: Not on file  . Physically Abused: Not on file  . Sexually Abused: Not on file   Social History   Social History Narrative   The patient is married and lives in [place]. He does not have any children. His sister also plays an active role in his care. 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     ALLERGIES: Patient has no known allergies.   MEDICATIONS:  Current Outpatient Medications  Medication Sig Dispense Refill  . HYDROcodone-acetaminophen (NORCO/VICODIN) 5-325 MG tablet Take 1 tablet by mouth every 6 (six) hours as needed for moderate pain. 30 tablet 0  . magnesium citrate SOLN Take 296 mLs (1 Bottle total) by mouth once. (Patient not taking: Reported on 06/29/2019) 195 mL 0  . morphine (MS CONTIN) 30 MG 12 hr tablet Take 1 tablet (30 mg total) by  mouth every 12 (twelve) hours. 60 tablet 0  . oxyCODONE (OXY IR/ROXICODONE) 5 MG immediate release tablet Take 1-2 tablets (5-10 mg total) by mouth every 4 (four) hours as needed for severe pain. 120 tablet 0   No current facility-administered medications for this encounter.     REVIEW OF SYSTEMS: On review of systems, the patient reports that he is miserable and can only get about 15 minutes of relief of his pain each day. He describes worsening pain cycles throughout the day and that it is not predictable when it flares early in the day but that it is most terrible in the afternoon or early evening. His symptoms start in the right side of his upper back and now is having pain also in the right front of the chest. He denies shortness of breath but he sounds labored in his breathing over the phone. He denies, fevers, chills, night sweats, unintended weight changes. He denies any bowel or bladder disturbances, and denies abdominal pain, nausea or vomiting. He denies any new  musculoskeletal or joint aches or pains. A complete review of systems is obtained and is otherwise negative.     PHYSICAL EXAM:  Unable to assess but sounds tachypneic on the phone when talking.  ECOG = 2  0 - Asymptomatic (Fully active, able to carry on all predisease activities without restriction)  1 - Symptomatic but completely ambulatory (Restricted in physically strenuous activity but ambulatory and able to carry out work of a light or sedentary nature. For example, light housework, office work)  2 - Symptomatic, <50% in bed during the day (Ambulatory and capable of all self care but unable to carry out any work activities. Up and about more than 50% of waking hours)  3 - Symptomatic, >50% in bed, but not bedbound (Capable of only limited self-care, confined to bed or chair 50% or more of waking hours)  4 - Bedbound (Completely disabled. Cannot carry on any self-care. Totally confined to bed or chair)  5 - Death   Eustace Pen MM, Creech RH, Tormey DC, et al. [phone]). "Toxicity and response criteria of the [facility]". [place] Oncol. 5 (6): 649-55    LABORATORY DATA:  Lab Results  Component Value Date   WBC 8.2 07/14/2019   HGB 12.5 (L) 07/14/2019   HCT 39.5 07/14/2019   MCV 93.4 07/14/2019   PLT 453 (H) 07/14/2019   Lab Results  Component Value Date   NA 140 06/27/2019   K 4.2 06/27/2019   CL 106 06/27/2019   CO2 24 06/27/2019   Lab Results  Component Value Date   ALT 25 06/27/2019   AST 25 06/27/2019   ALKPHOS 70 06/27/2019   BILITOT 0.6 06/27/2019      RADIOGRAPHY: DG Chest 2 View  Result Date: 06/27/2019 CLINICAL DATA:  Chest pain. EXAM: CHEST - 2 VIEW COMPARISON:  02/19/2010; chest CT-02/21/2010 FINDINGS: Grossly unchanged cardiac silhouette and mediastinal contours. Interval development of an age-indeterminate likely partially loculated small to moderate-sized right-sided pleural effusion with associated right mid and lower lung  heterogeneous/consolidative opacities and volume loss. The left hemithorax remains well aerated. No evidence of edema. No pneumothorax. No acute osseous abnormalities. IMPRESSION: Interval development of age-indeterminate small to moderate-sized partially loculated right-sided pleural effusion with associated right mid and lower lung opacities, atelectasis versus infiltrate. Correlation with more recent outside examinations (if available) is advised. Otherwise, further evaluation with contrast-enhanced chest CT could performed as indicated. Electronically Signed   By: Jenny Reichmann  Watts M.D.   On: 06/27/2019 09:01   CT Chest W Contrast  Result Date: 06/27/2019 CLINICAL DATA:  Evaluate pleural effusion. EXAM: CT CHEST WITH CONTRAST TECHNIQUE: Multidetector CT imaging of the chest was performed during intravenous contrast administration. CONTRAST:  73mL OMNIPAQUE IOHEXOL 300 MG/ML  SOLN COMPARISON:  None FINDINGS: Cardiovascular: The heart size is normal. No pericardial effusion identified. Mediastinum/Nodes: Enlarged right internal mammary lymph node measures 2.2 cm, image 41/3 within the anterior mediastinum there is a ovoid solid mass measuring 4.7 x 2.9 cm, image 71/3 thyroid gland, trachea, and esophagus demonstrate no significant findings. Lungs/Pleura: Moderate loculated right pleural effusion identified with extensive solid enhancing pleural based tumor. Index mass within the right lateral costophrenic sulcus measures 6.5 x 3.1 cm, image 123/3. Index right lateral pleural based tumor overlying the mid lung measures 4.0 x 2.5 cm, image 80/3. Pleural tumor overlying the posterolateral right upper lobe measures 7.8 x 1.9 cm, image 59/3. Upper Abdomen: No acute abnormality. Musculoskeletal: No chest wall abnormality. No acute or significant osseous findings. Lytic lesion involving the lateral aspect of the right fourth and sixth ribs. IMPRESSION: 1. Loculated right pleural effusion with multifocal solid enhancing  pleural based tumors compatible with pleural based metastasis. In the absence of known malignancy findings are suspicious for primary bronchogenic carcinoma. Further investigation with PET-CT and tissue sampling advised. 2. Enlarged right internal mammary lymph node and anterior mediastinal mass compatible with metastatic adenopathy. 3. Lytic lesion involving the lateral aspect of the right fourth and sixth ribs is identified compatible with osseous metastasis. 4. Aortic atherosclerosis. Aortic Atherosclerosis (ICD10-I70.0). Electronically Signed   By: Kerby Moors M.D.   On: 06/27/2019 14:01   MR BRAIN W WO CONTRAST  Result Date: 07/08/2019 CLINICAL DATA:  Non-small cell lung cancer. Staging. Gait disturbance. EXAM: MRI HEAD WITHOUT AND WITH CONTRAST TECHNIQUE: Multiplanar, multiecho pulse sequences of the brain and surrounding structures were obtained without and with intravenous contrast. CONTRAST:  22mL GADAVIST GADOBUTROL 1 MMOL/ML IV SOLN COMPARISON:  02/21/2010 FINDINGS: Brain: Ordinary age related volume loss. Minimal chronic small-vessel ischemic change of the hemispheric white matter. No evidence of recent infarction. No cortical or large vessel territory infarction. No evidence of primary or metastatic mass lesion, recent hemorrhage, hydrocephalus or extra-axial collection. Low level dural enhancement is nonspecific and can be persistent from the previous head injury of 20/11. Unlikely to be malignant. Vascular: Major vessels at the base of the brain show flow. Skull and upper cervical spine: Negative Sinuses/Orbits: Clear/normal Other: None IMPRESSION: No evidence of metastatic disease. Mild age related volume loss and small-vessel change of the white matter. Diffuse dural enhancement. This is most likely persistent from the previous head injury in 2011. Malignant etiology would be unlikely. Electronically Signed   By: Nelson Chimes M.D.   On: 07/08/2019 15:05   NM PET Image Initial (PI) Skull Base  To Thigh  Result Date: 07/08/2019 CLINICAL DATA:  Initial treatment strategy for solitary pulmonary nodule. EXAM: NUCLEAR MEDICINE PET SKULL BASE TO THIGH TECHNIQUE: 7.4 mCi F-18 FDG was injected intravenously. Full-ring PET imaging was performed from the skull base to thigh after the radiotracer. CT data was obtained and used for attenuation correction and anatomic localization. Fasting blood glucose: 65 mg/dl COMPARISON:  Chest radiograph 07/03/2019 CT chest 06/27/2019 FINDINGS: Mediastinal blood pool activity: SUV max 2.2 Liver activity: SUV max NA NECK: Mildly asymmetric activity along the right glottis and ariepiglottic fold, maximum SUV 4.2, no CT correlate, probably physiologic/incidental. Incidental CT findings: none CHEST:  Multiple large right pleural masses, with least 2 sites of invasion of the right chest wall. A mass along the anterior mediastinal border of the pleura measures 3.1 by 4.8 cm on image 78/4 with maximum SUV 17.1. A hypermetabolic mass extending through the third intercostal space and eroding the right fourth rib measures approximately 2.8 cm in thickness with maximum SUV 22.3. There is also a mass invading through the fifth intercostal space with erosion of the right sixth rib. Small right pleural effusion. There is a mass along the right lateral hemidiaphragm inseparable from the diaphragm and indenting the margin of the liver, this mass has a maximum SUV of 26.1. The previously described anterior mediastinal mass and right internal mammary lymph node may represent extension of pleural disease given that both abut the pleura. There is subtle accentuated activity anteriorly the left fourth rib along the costochondral junction, maximum SUV 3.2, without a definite adjacent pleural mass, an without a definite well-defined fracture. Incidental CT findings: none ABDOMEN/PELVIS: No significant abnormal hypermetabolic activity in this region. Incidental CT findings: None SKELETON: Small focus  of accentuated activity along the inferior margin of the right T11 vertebra, maximum SUV 3.9, no CT correlate, probably incidental but merits surveillance. Right rib erosions due to pleural masses as noted above. Incidental CT findings: none IMPRESSION: 1. Multiple large hypermetabolic right pleural masses with at least 2 sites of chest wall invasion with associated rib erosions, compatible with either primary pleural malignancy, peripheral lung cancer with early spread to the pleura, or lymphoproliferative disease with pleural involvement. Anterior mediastinal mass and right internal mammary lymph node may represent local invasion from the pleural disease. Tissue diagnosis recommended. 2. Subtle activity anteriorly in the left fourth rib along the costochondral junction. This low-grade activity could be from a subtle nondisplaced occult fracture. There is no underlying CT abnormality or pleural lesion in this vicinity. Regional surveillance is recommended. 3. Mildly asymmetric activity in the right glottis without appreciable CT abnormality, probably incidental and physiologic. Electronically Signed   By: Van Clines M.D.   On: 07/08/2019 11:59   CT Biopsy  Result Date: 07/14/2019 CLINICAL DATA:  Pleural masses, hypermetabolic on PET-CT, with body wall and rib invasion. EXAM: CT GUIDED CORE BIOPSY OF PLEURAL MASS ANESTHESIA/SEDATION: Intravenous Fentanyl 168mcg and Versed 2.5mg  were administered as conscious sedation during continuous monitoring of the patient's level of consciousness and physiological / cardiorespiratory status by the radiology RN, with a total moderate sedation time of 20 minutes. PROCEDURE: The procedure risks, benefits, and alternatives were explained to the patient. Questions regarding the procedure were encouraged and answered. The patient understands and consents to the procedure. Select axial scans through the thorax were obtained. A representative right pleural mass with body  wall invasion was localized and an appropriate skin entry site was determined and marked. The operative field was prepped with chlorhexidinein a sterile fashion, and a sterile drape was applied covering the operative field. A sterile gown and sterile gloves were used for the procedure. Local anesthesia was provided with 1% Lidocaine. Under CT fluoroscopic guidance, a 17 gauge trocar needle was advanced to the margin of the lesion. The aerated lung was not traversed. Once needle tip position was confirmed, coaxial 18-gauge core biopsy samples were obtained, submitted in saline to surgical pathology. The guide needle was removed. Postprocedure scans show no hemorrhage, pneumothorax, or other apparent complication. COMPLICATIONS: None immediate FINDINGS: Lobular right pleural masses are localized. Representative core biopsy samples obtained as above. IMPRESSION: 1. Technically successful CT-guided  core biopsy, right pleural mass. Electronically Signed   By: Lucrezia Europe M.D.   On: 07/14/2019 16:16   DG Chest Port 1 View  Result Date: 07/03/2019 CLINICAL DATA:  Thoracentesis. EXAM: PORTABLE CHEST 1 VIEW COMPARISON:  CT 06/27/2019.  Chest x-ray 06/27/2019. FINDINGS: Mediastinum and hilar structures normal. Heart size normal. Right pleural masses/loculated pleural effusion again noted without interim change. Reference is made to CT report of 06/27/2019. No new abnormality identified. No acute infiltrate noted. No pneumothorax. Right rib lesions best identified by prior CT. IMPRESSION: Right pleural masses/loculated pleural effusion again noted without interim change. Reference is made to prior CT report of 06/27/2019 for further discussion. No new abnormality or acute infiltrate identified. Right rib lesions best identified by prior CT. Electronically Signed   By: Marcello Moores  Register   On: 07/03/2019 12:18   US THORACENTESIS ASP PLEURAL SPACE W/IMG GUIDE  Result Date: 07/03/2019 INDICATION: Patient with history of right  pleural based masses, adenopathy, right rib lytic lesions, right pleural effusion. Request made for diagnostic and therapeutic right thoracentesis. EXAM: ULTRASOUND GUIDED DIAGNOSTIC AND THERAPEUTIC RIGHT THORACENTESIS MEDICATIONS: None COMPLICATIONS: None immediate. PROCEDURE: An ultrasound guided thoracentesis was thoroughly discussed with the patient and questions answered. The benefits, risks, alternatives and complications were also discussed. The patient understands and wishes to proceed with the procedure. Written consent was obtained. Ultrasound was performed to localize and mark an adequate pocket of fluid in the right chest. The area was then prepped and draped in the normal sterile fashion. 1% Lidocaine was used for local anesthesia. Under ultrasound guidance a 6 Fr Safe-T-Centesis catheter was introduced. Thoracentesis was performed. The catheter was removed and a dressing applied. FINDINGS: A total of approximately 500 cc of blood-tinged fluid was removed. Samples were sent to the laboratory as requested by the clinical team. IMPRESSION: Successful ultrasound guided diagnostic and therapeutic right thoracentesis yielding 500 cc of pleural fluid. Read by: Rowe Robert, PA-C Electronically Signed   By: Aletta Edouard M.D.   On: 07/03/2019 12:00       IMPRESSION/PLAN: 1. Carcinoma most likely arising in the pleura or lung. Dr. Lisbeth Renshaw discusses the imaging findings and the concerns for a malignant process. We will follow up with his biopsy results as soon as they are available. We discussed the most likely scenario is a malignancy that arose in the chest. Mesothelioma is on the differential as well given his history. We discussed the rationale to proceed with a palliative course of radiotherapy for pain relief to the pleural lesion seen on PET.  We discussed the risks, benefits, short, and long term effects of radiotherapy, and the patient is interested in proceeding. Dr. Lisbeth Renshaw discusses the delivery  and logistics of radiotherapy and anticipates a course of about 3 weeks of radiotherapy. He will come tomorrow to simulate and we anticipate his treatment to start on 07/20/19. He may also start systemic therapy with Dr. Julien Nordmann in the midst of radiotherapy.  2. Pain secondary to #1. I have discontinued his Norco, and given him a prescription for Oxycodone. We may need to adjust his long acting morphine as well depending on his 24 hour pain needs.     Given current concerns for patient exposure during the COVID-19 pandemic, this encounter was conducted via telephone.  The patient has given verbal consent for this type of encounter. The time spent during this encounter was 60 minutes and 50% of that time was spent in the coordination of his care. The attendants for this meeting  included Dr. Lisbeth Renshaw, Shona Simpson, [facility] and Francia Greaves  During the encounter, Dr. Lisbeth Renshaw and Shona Simpson [facility] were located at [facility] Radiation Oncology Department.  JAVIEL CANEPA  was located at home.   The above documentation reflects my direct findings during this shared patient visit. Please see the separate note by Dr. Lisbeth Renshaw on this date for the remainder of the patient's plan of care.    Carola Rhine, PAC

## 2019-07-16 ENCOUNTER — Ambulatory Visit
Admission: RE | Admit: 2019-07-16 | Discharge: 2019-07-16 | Disposition: A | Discharge status: Home / Self Care | Payer: Medicare Other | Source: Ambulatory Visit | Attending: Radiation Oncology | Admitting: Radiation Oncology

## 2019-07-16 ENCOUNTER — Encounter: Payer: Self-pay | Admitting: Radiation Oncology

## 2019-07-16 ENCOUNTER — Other Ambulatory Visit: Payer: Self-pay

## 2019-07-16 DIAGNOSIS — Z510 Encounter for antineoplastic radiation therapy: Secondary | ICD-10-CM | POA: Insufficient documentation

## 2019-07-16 DIAGNOSIS — C782 Secondary malignant neoplasm of pleura: Secondary | ICD-10-CM | POA: Diagnosis present

## 2019-07-16 DIAGNOSIS — C801 Malignant (primary) neoplasm, unspecified: Secondary | ICD-10-CM | POA: Insufficient documentation

## 2019-07-16 NOTE — Addendum Note (Signed)
Encounter addended by: Kyung Rudd, MD on: 07/16/2019 11:57 AM  Actions taken: Clinical Note Signed

## 2019-07-17 DIAGNOSIS — Z510 Encounter for antineoplastic radiation therapy: Secondary | ICD-10-CM | POA: Diagnosis not present

## 2019-07-20 ENCOUNTER — Telehealth: Payer: Self-pay | Admitting: Radiation Oncology

## 2019-07-20 ENCOUNTER — Ambulatory Visit
Admission: RE | Admit: 2019-07-20 | Discharge: 2019-07-20 | Disposition: A | Discharge status: Home / Self Care | Payer: Medicare Other | Source: Ambulatory Visit | Attending: Radiation Oncology | Admitting: Radiation Oncology

## 2019-07-20 ENCOUNTER — Other Ambulatory Visit: Payer: Self-pay

## 2019-07-20 ENCOUNTER — Other Ambulatory Visit: Payer: Self-pay | Admitting: Radiation Oncology

## 2019-07-20 DIAGNOSIS — Z510 Encounter for antineoplastic radiation therapy: Secondary | ICD-10-CM | POA: Diagnosis not present

## 2019-07-20 MED ORDER — MORPHINE SULFATE ER 60 MG PO TBCR: 60.0000 mg | EXTENDED_RELEASE_TABLET | Freq: Two times a day (BID) | ORAL | 0 refills | Status: DC

## 2019-07-20 NOTE — Telephone Encounter (Signed)
I called the patient this morning to let him know pathology favors mesothelioma but could confirm it is malignant. We will proceed this afternoon but I had to leave a message asking him to call us back or that we could review when he comes in for treatment today.

## 2019-07-20 NOTE — Progress Notes (Signed)
I spoke with the patient by phone at the machine. He says pain is 4-8/10 at times but markedly better than last week prior to switching to oxycodone. Nursing is going to get a copy of his medication schedule so we can evaluate this. He has also had terrible constipation despite a small amount of mag citrate and an enema. We discussed increasing the mag citrate today and starting BID. He has been consistently taking 45 mg of Oxycodone up to 60 mg per day. 45 mg is roughty 67 mg of Morphine and he is already taking 60 mg of MSContin. I would recommend changing his long acting to 60 mg MsContin twice daily so he would need far less oxycodone for breakthru. Reiterated to treating staff and RN and we will check BP tomorrow also to make sure he is able to tolerate this change in narcotics.

## 2019-07-21 ENCOUNTER — Encounter: Payer: Self-pay | Admitting: Internal Medicine

## 2019-07-21 ENCOUNTER — Inpatient Hospital Stay (HOSPITAL_BASED_OUTPATIENT_CLINIC_OR_DEPARTMENT_OTHER): Payer: Medicare Other | Admitting: Internal Medicine

## 2019-07-21 ENCOUNTER — Other Ambulatory Visit: Payer: Self-pay

## 2019-07-21 ENCOUNTER — Ambulatory Visit
Admission: RE | Admit: 2019-07-21 | Discharge: 2019-07-21 | Disposition: A | Discharge status: Home / Self Care | Payer: Medicare Other | Source: Ambulatory Visit | Attending: Radiation Oncology | Admitting: Radiation Oncology

## 2019-07-21 VITALS — BP 138/78 | HR 114 | Temp 97.4°F | Resp 18 | Ht 68.0 in | Wt 153.2 lb

## 2019-07-21 DIAGNOSIS — Z510 Encounter for antineoplastic radiation therapy: Secondary | ICD-10-CM | POA: Diagnosis not present

## 2019-07-21 DIAGNOSIS — C450 Mesothelioma of pleura: Secondary | ICD-10-CM | POA: Diagnosis not present

## 2019-07-21 DIAGNOSIS — Z7189 Other specified counseling: Secondary | ICD-10-CM

## 2019-07-21 DIAGNOSIS — G893 Neoplasm related pain (acute) (chronic): Secondary | ICD-10-CM

## 2019-07-21 DIAGNOSIS — C3490 Malignant neoplasm of unspecified part of unspecified bronchus or lung: Secondary | ICD-10-CM | POA: Diagnosis not present

## 2019-07-21 DIAGNOSIS — Z5111 Encounter for antineoplastic chemotherapy: Secondary | ICD-10-CM | POA: Diagnosis not present

## 2019-07-21 MED ORDER — CYANOCOBALAMIN 1000 MCG/ML IJ SOLN
1000.0000 ug | Freq: Once | INTRAMUSCULAR | Status: AC
  Administered 2019-07-21: 10:00:00 1000 ug via INTRAMUSCULAR

## 2019-07-21 MED ORDER — DEXAMETHASONE 4 MG PO TABS: ORAL_TABLET | ORAL | 1 refills | Status: AC

## 2019-07-21 MED ORDER — CYANOCOBALAMIN 1000 MCG/ML IJ SOLN
INTRAMUSCULAR | Status: AC
  Filled 2019-07-21: qty 1

## 2019-07-21 MED ORDER — PROCHLORPERAZINE MALEATE 10 MG PO TABS: 10.0000 mg | ORAL_TABLET | Freq: Four times a day (QID) | ORAL | 0 refills | Status: DC | PRN

## 2019-07-21 MED ORDER — FOLIC ACID 1 MG PO TABS: 1.0000 mg | ORAL_TABLET | Freq: Every day | ORAL | 4 refills | Status: DC

## 2019-07-21 NOTE — Progress Notes (Signed)
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  PRIOR THERAPY: None  CURRENT THERAPY: Systemic chemotherapy with cisplatin 75 mg/M2, Alimta 500 mg/M2 and Avastin 15 mg/KG every 3 weeks.  First dose July 28, 2019.  INTERVAL HISTORY: Mario Harmon 74 y.o. male returns to the clinic today for follow-up visit.  The patient is feeling fine today except for the persistent right-sided chest pain which is much better controlled compared to few weeks ago.  He rates his pain between 4-8 on a scale from 1-10.  He is currently on MS Contin 30 mg p.o. every 12 hours in addition to oxycodone for breakthrough pain.  The patient was seen by radiation oncology and started the first fraction of radiotherapy yesterday.  He also had CT-guided core biopsy of one of the pleural-based masses by interventional radiology and the preliminary pathology is consistent with malignant pleural mesothelioma.  Further immunohistochemical stains are still pending.  The patient denied having any shortness of breath, cough or hemoptysis.  He denied having any nausea, vomiting, diarrhea or constipation.  He has no headache or visual changes.  His MRI of the brain is unremarkable for metastatic disease to the brain.  He is here today for evaluation and discussion of his treatment options.   MEDICAL HISTORY:No past medical history on file.  ALLERGIES:  has No Known Allergies.  MEDICATIONS:  Current Outpatient Medications  Medication Sig Dispense Refill  . magnesium citrate SOLN Take 296 mLs (1 Bottle total) by mouth once. (Patient not  taking: Reported on 06/29/2019) 195 mL 0  . morphine (MS CONTIN) 60 MG 12 hr tablet Take 1 tablet (60 mg total) by mouth every 12 (twelve) hours. 60 tablet 0  . oxyCODONE (OXY IR/ROXICODONE) 5 MG immediate release tablet Take 1-2 tablets (5-10 mg total) by mouth every 4 (four) hours as needed for severe pain. 120 tablet 0   No current facility-administered medications for this visit.    SURGICAL HISTORY: No past surgical history on file.  REVIEW OF SYSTEMS:  Constitutional: positive for anorexia, fatigue and weight loss Eyes: negative Ears, nose, mouth, throat, and face: negative Respiratory: positive for pleurisy/chest pain Cardiovascular: negative Gastrointestinal: negative Genitourinary:negative Integument/breast: negative Hematologic/lymphatic: negative Musculoskeletal:negative Neurological: negative Behavioral/Psych: negative Endocrine: negative Allergic/Immunologic: negative   PHYSICAL EXAMINATION: General appearance: alert, cooperative, fatigued and no distress Head: Normocephalic, without obvious abnormality, atraumatic Neck: no adenopathy, no JVD, supple, symmetrical, trachea midline and thyroid not enlarged, symmetric, no tenderness/mass/nodules Lymph nodes: Cervical, supraclavicular, and axillary nodes normal. Resp: clear to auscultation bilaterally Back: symmetric, no curvature. ROM normal. No CVA tenderness. Cardio: regular rate and rhythm, S1, S2 normal, no murmur, click, rub or gallop GI: soft, non-tender; bowel sounds normal; no masses,  no organomegaly Extremities: extremities normal, atraumatic, no cyanosis or edema Neurologic: Alert and oriented X 3, normal strength and tone. Normal symmetric reflexes. Normal coordination and gait  ECOG PERFORMANCE STATUS: 1 - Symptomatic but completely ambulatory  Blood pressure 138/78, pulse (!) 114, temperature (!) 97.4 F (36.3 C), temperature source Oral, resp. rate 18, height 5\' 8"  (1.727 m), weight 153 lb 3.2 oz (69.5  kg), SpO2 98 %.  LABORATORY DATA: Lab Results  Component Value Date   WBC 8.2 07/14/2019   HGB 12.5 (L) 07/14/2019   HCT 39.5 07/14/2019   MCV 93.4 07/14/2019   PLT 453 (H) 07/14/2019      Chemistry      Component Value Date/Time   NA 140 06/27/2019 0916   K 4.2 06/27/2019 0916   CL 106 06/27/2019 0916   CO2 24 06/27/2019 0916   BUN 10 06/27/2019 0916   CREATININE 1.03 06/27/2019 0916      Component Value Date/Time   CALCIUM 9.1 06/27/2019 0916   ALKPHOS 70 06/27/2019 1213   AST 25 06/27/2019 1213   ALT 25 06/27/2019 1213   BILITOT 0.6 06/27/2019 1213       RADIOGRAPHIC STUDIES: DG Chest 2 View  Result Date: 06/27/2019 CLINICAL DATA:  Chest pain. EXAM: CHEST - 2 VIEW COMPARISON:  02/19/2010; chest CT-02/21/2010 FINDINGS: Grossly unchanged cardiac silhouette and mediastinal contours. Interval development of an age-indeterminate likely partially loculated small to moderate-sized right-sided pleural effusion with associated right mid and lower lung heterogeneous/consolidative opacities and volume loss. The left hemithorax remains well aerated. No evidence of edema. No pneumothorax. No acute osseous abnormalities. IMPRESSION: Interval development of age-indeterminate small to moderate-sized partially loculated right-sided pleural effusion with associated right mid and lower lung opacities, atelectasis versus infiltrate. Correlation with more recent outside examinations (if available) is advised. Otherwise, further evaluation with contrast-enhanced chest CT could performed as indicated. Electronically Signed   By: Sandi Mariscal M.D.   On: 06/27/2019 09:01   CT Chest W Contrast  Result Date: 06/27/2019 CLINICAL DATA:  Evaluate pleural effusion. EXAM: CT CHEST WITH CONTRAST TECHNIQUE: Multidetector CT imaging of the chest was performed during intravenous contrast administration. CONTRAST:  72mL OMNIPAQUE IOHEXOL 300 MG/ML  SOLN COMPARISON:  None FINDINGS: Cardiovascular: The heart size is  normal. No pericardial effusion identified. Mediastinum/Nodes: Enlarged right internal mammary lymph node measures 2.2 cm, image 41/3 within the anterior mediastinum there is a ovoid solid mass measuring 4.7 x 2.9 cm, image 71/3 thyroid gland, trachea, and esophagus demonstrate no significant findings. Lungs/Pleura: Moderate loculated right pleural effusion identified with extensive solid enhancing pleural based tumor. Index mass within the right lateral costophrenic sulcus measures 6.5 x 3.1 cm, image 123/3. Index right lateral pleural based tumor overlying the mid lung measures 4.0 x 2.5 cm, image 80/3. Pleural tumor overlying the posterolateral right upper lobe measures 7.8 x 1.9 cm, image 59/3. Upper Abdomen: No acute abnormality. Musculoskeletal: No chest wall abnormality. No acute or significant osseous findings. Lytic lesion involving the lateral aspect of the right fourth and sixth ribs. IMPRESSION: 1. 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 In the absence of known malignancy findings are suspicious for primary bronchogenic carcinoma. Further investigation with PET-CT and tissue sampling advised. 2. Enlarged right internal mammary lymph node and anterior mediastinal mass compatible with metastatic adenopathy. 3. Lytic lesion involving the lateral aspect of the right fourth and sixth ribs is identified compatible with osseous metastasis. 4. Aortic atherosclerosis. Aortic Atherosclerosis (ICD10-I70.0). Electronically Signed   By: Kerby Moors M.D.   On: 06/27/2019 14:01   MR BRAIN W WO CONTRAST  Result Date: 07/08/2019 CLINICAL DATA:  Non-small cell lung cancer. Staging. Gait disturbance. EXAM: MRI HEAD WITHOUT AND WITH CONTRAST TECHNIQUE: Multiplanar, multiecho pulse sequences of the brain and surrounding structures were obtained without and with intravenous contrast. CONTRAST:  98mL GADAVIST GADOBUTROL 1 MMOL/ML IV  SOLN COMPARISON:  02/21/2010 FINDINGS: Brain: Ordinary age related volume loss. Minimal chronic small-vessel ischemic change of the hemispheric white matter. No evidence of recent infarction. No cortical or large vessel territory infarction. No evidence of primary or metastatic mass lesion, recent hemorrhage, hydrocephalus or extra-axial collection. Low level dural enhancement is nonspecific and can be persistent from the previous head injury of 20/11. Unlikely to be malignant. Vascular: Major vessels at the base of the brain show flow. Skull and upper cervical spine: Negative Sinuses/Orbits: Clear/normal Other: None IMPRESSION: No evidence of metastatic disease. Mild age related volume loss and small-vessel change of the white matter. Diffuse dural enhancement. This is most likely persistent from the previous head injury in 2011. Malignant etiology would be unlikely. Electronically Signed   By: Nelson Chimes M.D.   On: 07/08/2019 15:05   NM PET Image Initial (PI) Skull Base To Thigh  Result Date: 07/08/2019 CLINICAL DATA:  Initial treatment strategy for solitary pulmonary nodule. EXAM: NUCLEAR MEDICINE PET SKULL BASE TO THIGH TECHNIQUE: 7.4 mCi F-18 FDG was injected intravenously. Full-ring PET imaging was performed from the skull base to thigh after the radiotracer. CT data was obtained and used for attenuation correction and anatomic localization. Fasting blood glucose: 65 mg/dl COMPARISON:  Chest radiograph 07/03/2019 CT chest 06/27/2019 FINDINGS: Mediastinal blood pool activity: SUV max 2.2 Liver activity: SUV max NA NECK: Mildly asymmetric activity along the right glottis and ariepiglottic fold, maximum SUV 4.2, no CT correlate, probably physiologic/incidental. Incidental CT findings: none CHEST: Multiple large right pleural masses, with least 2 sites of invasion of the right chest wall. A mass along the anterior mediastinal border of the pleura measures 3.1 by 4.8 cm on image 78/4 with maximum SUV 17.1. A  hypermetabolic mass extending through the third intercostal space and eroding the right fourth rib measures approximately 2.8 cm in thickness with maximum SUV 22.3. There is also a mass invading through the fifth intercostal space with erosion of the right sixth rib. Small right pleural effusion. There is a mass along the right lateral hemidiaphragm inseparable from the diaphragm and indenting the margin of the liver, this mass has a maximum SUV of 26.1. The previously described anterior mediastinal mass and right internal mammary lymph node may represent extension of pleural disease given that both abut the pleura. There is subtle accentuated activity anteriorly the left fourth rib along the costochondral junction, maximum SUV 3.2, without a definite adjacent pleural mass, an without a definite well-defined fracture. Incidental CT findings: none ABDOMEN/PELVIS: No significant abnormal hypermetabolic activity in this region. Incidental CT findings: None SKELETON: Small focus of accentuated activity along the inferior margin of the right T11 vertebra, maximum SUV 3.9, no CT correlate, probably incidental but merits surveillance. Right rib erosions due to pleural masses as noted above. Incidental CT findings: none IMPRESSION: 1. Multiple large hypermetabolic right pleural masses with at least 2 sites of chest wall invasion with associated rib erosions, compatible with either primary pleural malignancy, peripheral lung cancer with early spread to the pleura, or lymphoproliferative disease with pleural involvement. Anterior mediastinal mass and right internal mammary lymph node may represent local invasion from the pleural disease. Tissue diagnosis recommended. 2. Subtle activity anteriorly in the left fourth rib along the costochondral junction. This low-grade activity could be from a subtle nondisplaced occult fracture. There is no underlying CT abnormality or pleural lesion in this vicinity. Regional surveillance is  recommended. 3. Mildly asymmetric activity in the right glottis without appreciable CT abnormality, probably incidental and physiologic. Electronically Signed  By: Van Clines M.D.   On: 07/08/2019 11:59   CT Biopsy  Result Date: 07/14/2019 CLINICAL DATA:  Pleural masses, hypermetabolic on PET-CT, with body wall and rib invasion. EXAM: CT GUIDED CORE BIOPSY OF PLEURAL MASS ANESTHESIA/SEDATION: Intravenous Fentanyl 131mcg and Versed 2.5mg  were administered as conscious sedation during continuous monitoring of the patient's level of consciousness and physiological / cardiorespiratory status by the radiology RN, with a total moderate sedation time of 20 minutes. PROCEDURE: The procedure risks, benefits, and alternatives were explained to the patient. Questions regarding the procedure were encouraged and answered. The patient understands and consents to the procedure. Select axial scans through the thorax were obtained. A representative right pleural mass with body wall invasion was localized and an appropriate skin entry site was determined and marked. The operative field was prepped with chlorhexidinein a sterile fashion, and a sterile drape was applied covering the operative field. A sterile gown and sterile gloves were used for the procedure. Local anesthesia was provided with 1% Lidocaine. Under CT fluoroscopic guidance, a 17 gauge trocar needle was advanced to the margin of the lesion. The aerated lung was not traversed. Once needle tip position was confirmed, coaxial 18-gauge core biopsy samples were obtained, submitted in saline to surgical pathology. The guide needle was removed. Postprocedure scans show no hemorrhage, pneumothorax, or other apparent complication. COMPLICATIONS: None immediate FINDINGS: Lobular right pleural masses are localized. Representative core biopsy samples obtained as above. IMPRESSION: 1. Technically successful CT-guided core biopsy, right pleural mass. Electronically  Signed   By: Lucrezia Europe M.D.   On: 07/14/2019 16:16   DG Chest Port 1 View  Result Date: 07/03/2019 CLINICAL DATA:  Thoracentesis. EXAM: PORTABLE CHEST 1 VIEW COMPARISON:  CT 06/27/2019.  Chest x-ray 06/27/2019. FINDINGS: Mediastinum and hilar structures normal. Heart size normal. Right pleural masses/loculated pleural effusion again noted without interim change. Reference is made to CT report of 06/27/2019. No new abnormality identified. No acute infiltrate noted. No pneumothorax. Right rib lesions best identified by prior CT. IMPRESSION: Right pleural masses/loculated pleural effusion again noted without interim change. Reference is made to prior CT report of 06/27/2019 for further discussion. No new abnormality or acute infiltrate identified. Right rib lesions best identified by prior CT. Electronically Signed   By: Marcello Moores  Register   On: 07/03/2019 12:18   US THORACENTESIS ASP PLEURAL SPACE W/IMG GUIDE  Result Date: 07/03/2019 INDICATION: Patient with history of right pleural based masses, adenopathy, right rib lytic lesions, right pleural effusion. Request made for diagnostic and therapeutic right thoracentesis. EXAM: ULTRASOUND GUIDED DIAGNOSTIC AND THERAPEUTIC RIGHT THORACENTESIS MEDICATIONS: None COMPLICATIONS: None immediate. PROCEDURE: An ultrasound guided thoracentesis was thoroughly discussed with the patient and questions answered. The benefits, risks, alternatives and complications were also discussed. The patient understands and wishes to proceed with the procedure. Written consent was obtained. Ultrasound was performed to localize and mark an adequate pocket of fluid in the right chest. The area was then prepped and draped in the normal sterile fashion. 1% Lidocaine was used for local anesthesia. Under ultrasound guidance a 6 Fr Safe-T-Centesis catheter was introduced. Thoracentesis was performed. The catheter was removed and a dressing applied. FINDINGS: A total of approximately 500 cc of  blood-tinged fluid was removed. Samples were sent to the laboratory as requested by the clinical team. IMPRESSION: Successful ultrasound guided diagnostic and therapeutic right thoracentesis yielding 500 cc of pleural fluid. Read by: Rowe Robert, PA-C Electronically Signed   By: Aletta Edouard M.D.   On: 07/03/2019 12:00  ASSESSMENT AND PLAN: This is a very pleasant 74 years old white male with recently diagnosed malignant pleural mesothelioma involving the right chest with evidence of metastatic disease to the mediastinal lymph nodes and large pleural-based masses as well as right pleural effusion. I had a lengthy discussion with the patient today about his current disease stage, prognosis and treatment options. I explained to the patient that he has incurable condition and all the treatment will be of palliative nature.  I do not think the patient will be a good candidate for surgical intervention but I will also discuss with one of the thoracic surgeon to see if there is a surgical option for this patient. I discussed with him his treatment options and gave him the option of palliative care versus palliative systemic chemotherapy with cisplatin 75 mg/M2, Alimta 500 mg/M2 and Avastin 15 mg/KG every 3 weeks versus treatment with immunotherapy with ipilimumab and nivolumab. After discussion of all the treatment options and benefit and risk, the patient is interested in proceeding with the systemic chemotherapy and he is expected to start the first cycle of this treatment on July 28, 2019. I discussed with him the adverse effect of the chemotherapy including but not limited to alopecia, myelosuppression, nausea and vomiting, peripheral neuropathy, liver or renal dysfunction. The patient will have a chemotherapy education class before the first cycle of his treatment. He will have vitamin B12 injection today. I will send prescription for folic acid 1 mg p.o. daily, Compazine 10 mg p.o. every 6 hours  as needed for nausea as well as Decadron 4 mg p.o. twice daily the day before, day of and day after chemotherapy to his pharmacy. He will come back for follow-up visit in 2 weeks for evaluation and management of any adverse effect of his treatment. For pain management, the patient will continue with his current treatment with MS Contin as well as Percocet for breakthrough pain.  He also undergoing palliative radiotherapy to the painful lesion on the right side of the chest. The patient was advised to call immediately if he has any concerning symptoms in the interval. The patient voices understanding of current disease status and treatment options and is in agreement with the current care plan.  All questions were answered. The patient knows to call the clinic with any problems, questions or concerns. We can certainly see the patient much sooner if necessary.  Disclaimer: This note was dictated with voice recognition software. Similar sounding words can inadvertently be transcribed and may not be corrected upon review.

## 2019-07-21 NOTE — Progress Notes (Signed)
START ON PATHWAY REGIMEN - Mesothelioma   Bevacizumab 15 mg/kg IV + Cisplatin 75 mg/m2 IV + Pemetrexed 500 mg/m2 IV q21 Days:   A cycle is every 21 days:     Pemetrexed      Cisplatin      Bevacizumab-xxxx   **Always confirm dose/schedule in your pharmacy ordering system**  Bevacizumab 15 mg/kg q21d:   A cycle is every 21 days:     Bevacizumab-xxxx   **Always confirm dose/schedule in your pharmacy ordering system**  Patient Characteristics: Newly Diagnosed, Preoperative or Nonsurgical Candidate (Clinical Staging), Pleurectomy/Decortication or Other Surgical Procedure Not Planned, First Line Therapeutic Status: Newly Diagnosed, Preoperative or Nonsurgical Candidate (Clinical Staging) AJCC T Category: cT3 AJCC N Category: cN2 AJCC 8 Stage Grouping: IIIB AJCC M Category: cM0 Intent of Therapy: Non-Curative / Palliative Intent, Discussed with Patient

## 2019-07-22 ENCOUNTER — Ambulatory Visit
Admission: RE | Admit: 2019-07-22 | Discharge: 2019-07-22 | Disposition: A | Discharge status: Home / Self Care | Payer: Medicare Other | Source: Ambulatory Visit | Attending: Radiation Oncology | Admitting: Radiation Oncology

## 2019-07-22 ENCOUNTER — Other Ambulatory Visit: Payer: Self-pay

## 2019-07-22 DIAGNOSIS — Z510 Encounter for antineoplastic radiation therapy: Secondary | ICD-10-CM | POA: Diagnosis not present

## 2019-07-23 ENCOUNTER — Other Ambulatory Visit: Payer: Self-pay

## 2019-07-23 ENCOUNTER — Ambulatory Visit
Admission: RE | Admit: 2019-07-23 | Discharge: 2019-07-23 | Disposition: A | Discharge status: Home / Self Care | Payer: Medicare Other | Source: Ambulatory Visit | Attending: Radiation Oncology | Admitting: Radiation Oncology

## 2019-07-23 DIAGNOSIS — Z510 Encounter for antineoplastic radiation therapy: Secondary | ICD-10-CM | POA: Diagnosis not present

## 2019-07-23 LAB — SURGICAL PATHOLOGY

## 2019-07-24 ENCOUNTER — Telehealth: Payer: Self-pay | Admitting: Internal Medicine

## 2019-07-24 ENCOUNTER — Inpatient Hospital Stay: Payer: Medicare Other

## 2019-07-24 ENCOUNTER — Other Ambulatory Visit: Payer: Self-pay

## 2019-07-24 ENCOUNTER — Ambulatory Visit
Admission: RE | Admit: 2019-07-24 | Discharge: 2019-07-24 | Disposition: A | Discharge status: Home / Self Care | Payer: Medicare Other | Source: Ambulatory Visit | Attending: Radiation Oncology | Admitting: Radiation Oncology

## 2019-07-24 DIAGNOSIS — Z510 Encounter for antineoplastic radiation therapy: Secondary | ICD-10-CM | POA: Diagnosis not present

## 2019-07-24 NOTE — Telephone Encounter (Signed)
Called and spoke with patient. Went over appts for the next 2 weeks. Confirmed changes made with chemo edu, radiation, infusion, and f/u appts.

## 2019-07-27 ENCOUNTER — Ambulatory Visit
Admission: RE | Admit: 2019-07-27 | Discharge: 2019-07-27 | Disposition: A | Discharge status: Home / Self Care | Payer: Medicare Other | Source: Ambulatory Visit | Attending: Radiation Oncology | Admitting: Radiation Oncology

## 2019-07-27 ENCOUNTER — Inpatient Hospital Stay: Payer: Medicare Other | Attending: Internal Medicine

## 2019-07-27 ENCOUNTER — Other Ambulatory Visit: Payer: Self-pay

## 2019-07-27 DIAGNOSIS — C7951 Secondary malignant neoplasm of bone: Secondary | ICD-10-CM | POA: Insufficient documentation

## 2019-07-27 DIAGNOSIS — T451X5A Adverse effect of antineoplastic and immunosuppressive drugs, initial encounter: Secondary | ICD-10-CM | POA: Insufficient documentation

## 2019-07-27 DIAGNOSIS — Z510 Encounter for antineoplastic radiation therapy: Secondary | ICD-10-CM | POA: Insufficient documentation

## 2019-07-27 DIAGNOSIS — C782 Secondary malignant neoplasm of pleura: Secondary | ICD-10-CM | POA: Insufficient documentation

## 2019-07-27 DIAGNOSIS — K5903 Drug induced constipation: Secondary | ICD-10-CM | POA: Insufficient documentation

## 2019-07-27 DIAGNOSIS — C450 Mesothelioma of pleura: Secondary | ICD-10-CM | POA: Insufficient documentation

## 2019-07-27 DIAGNOSIS — C771 Secondary and unspecified malignant neoplasm of intrathoracic lymph nodes: Secondary | ICD-10-CM | POA: Insufficient documentation

## 2019-07-27 DIAGNOSIS — R112 Nausea with vomiting, unspecified: Secondary | ICD-10-CM | POA: Insufficient documentation

## 2019-07-27 DIAGNOSIS — C801 Malignant (primary) neoplasm, unspecified: Secondary | ICD-10-CM | POA: Insufficient documentation

## 2019-07-27 DIAGNOSIS — K521 Toxic gastroenteritis and colitis: Secondary | ICD-10-CM | POA: Insufficient documentation

## 2019-07-27 DIAGNOSIS — Z5111 Encounter for antineoplastic chemotherapy: Secondary | ICD-10-CM | POA: Insufficient documentation

## 2019-07-27 MED ORDER — SONAFINE EX EMUL
1.0000 "application " | Freq: Once | CUTANEOUS | Status: AC
  Administered 2019-07-27: 1 via TOPICAL

## 2019-07-27 NOTE — Progress Notes (Signed)
Pt here for patient teaching.  Pt given Radiation and You booklet, skin care instructions and Sonafine.  Reviewed areas of pertinence such as fatigue, hair loss, skin changes, throat changes, cough and shortness of breath . Pt able to give teach back of to pat skin and use unscented/gentle soap,apply Sonafine bid and avoid applying anything to skin within 4 hours of treatment. Pt verbalizes understanding of information given and will contact nursing with any questions or concerns.    Mario Harmon. Leonie Green, BSN

## 2019-07-28 ENCOUNTER — Other Ambulatory Visit: Payer: Self-pay

## 2019-07-28 ENCOUNTER — Ambulatory Visit
Admission: RE | Admit: 2019-07-28 | Discharge: 2019-07-28 | Disposition: A | Discharge status: Home / Self Care | Payer: Medicare Other | Source: Ambulatory Visit | Attending: Radiation Oncology | Admitting: Radiation Oncology

## 2019-07-29 ENCOUNTER — Other Ambulatory Visit: Payer: Self-pay | Admitting: Internal Medicine

## 2019-07-29 ENCOUNTER — Other Ambulatory Visit: Payer: Self-pay

## 2019-07-29 ENCOUNTER — Inpatient Hospital Stay: Payer: Medicare Other

## 2019-07-29 ENCOUNTER — Ambulatory Visit
Admission: RE | Admit: 2019-07-29 | Discharge: 2019-07-29 | Disposition: A | Discharge status: Home / Self Care | Payer: Medicare Other | Source: Ambulatory Visit | Attending: Radiation Oncology | Admitting: Radiation Oncology

## 2019-07-29 ENCOUNTER — Telehealth: Payer: Self-pay | Admitting: Medical Oncology

## 2019-07-29 VITALS — BP 142/85 | HR 97 | Temp 98.7°F | Resp 20

## 2019-07-29 DIAGNOSIS — C450 Mesothelioma of pleura: Secondary | ICD-10-CM

## 2019-07-29 LAB — CBC WITH DIFFERENTIAL (CANCER CENTER ONLY)
Abs Immature Granulocytes: 0.09 10*3/uL — ABNORMAL HIGH (ref 0.00–0.07)
Basophils Absolute: 0 10*3/uL (ref 0.0–0.1)
Basophils Relative: 0 %
Eosinophils Absolute: 0 10*3/uL (ref 0.0–0.5)
Eosinophils Relative: 0 %
HCT: 36.7 % — ABNORMAL LOW (ref 39.0–52.0)
Hemoglobin: 11.7 g/dL — ABNORMAL LOW (ref 13.0–17.0)
Immature Granulocytes: 1 %
Lymphocytes Relative: 1 %
Lymphs Abs: 0.1 10*3/uL — ABNORMAL LOW (ref 0.7–4.0)
MCH: 29 pg (ref 26.0–34.0)
MCHC: 31.9 g/dL (ref 30.0–36.0)
MCV: 90.8 fL (ref 80.0–100.0)
Monocytes Absolute: 0.9 10*3/uL (ref 0.1–1.0)
Monocytes Relative: 8 %
Neutro Abs: 10.4 10*3/uL — ABNORMAL HIGH (ref 1.7–7.7)
Neutrophils Relative %: 90 %
Platelet Count: 492 10*3/uL — ABNORMAL HIGH (ref 150–400)
RBC: 4.04 MIL/uL — ABNORMAL LOW (ref 4.22–5.81)
RDW: 12.5 % (ref 11.5–15.5)
WBC Count: 11.5 10*3/uL — ABNORMAL HIGH (ref 4.0–10.5)
nRBC: 0 % (ref 0.0–0.2)

## 2019-07-29 LAB — CMP (CANCER CENTER ONLY)
ALT: 34 U/L (ref 0–44)
AST: 22 U/L (ref 15–41)
Albumin: 2.9 g/dL — ABNORMAL LOW (ref 3.5–5.0)
Alkaline Phosphatase: 93 U/L (ref 38–126)
Anion gap: 12 (ref 5–15)
BUN: 18 mg/dL (ref 8–23)
CO2: 26 mmol/L (ref 22–32)
Calcium: 9.5 mg/dL (ref 8.9–10.3)
Chloride: 102 mmol/L (ref 98–111)
Creatinine: 0.88 mg/dL (ref 0.61–1.24)
GFR, Est AFR Am: >60 mL/min (ref >60)
GFR, Estimated: >60 mL/min (ref >60)
Glucose, Bld: 183 mg/dL — ABNORMAL HIGH (ref 70–99)
Potassium: 4.1 mmol/L (ref 3.5–5.1)
Sodium: 140 mmol/L (ref 135–145)
Total Bilirubin: 0.3 mg/dL (ref 0.3–1.2)
Total Protein: 7.2 g/dL (ref 6.5–8.1)

## 2019-07-29 LAB — TOTAL PROTEIN, URINE DIPSTICK: Protein, ur: NEGATIVE mg/dL

## 2019-07-29 LAB — MAGNESIUM: Magnesium: 2.2 mg/dL (ref 1.7–2.4)

## 2019-07-29 MED ORDER — PALONOSETRON HCL INJECTION 0.25 MG/5ML
0.2500 mg | Freq: Once | INTRAVENOUS | Status: AC
  Administered 2019-07-29: 0.25 mg via INTRAVENOUS

## 2019-07-29 MED ORDER — SODIUM CHLORIDE 0.9 % IV SOLN
500.0000 mg/m2 | Freq: Once | INTRAVENOUS | Status: AC
  Administered 2019-07-29: 900 mg via INTRAVENOUS
  Filled 2019-07-29: qty 16

## 2019-07-29 MED ORDER — SODIUM CHLORIDE 0.9 % IV SOLN
75.0000 mg/m2 | Freq: Once | INTRAVENOUS | Status: AC
  Administered 2019-07-29: 137 mg via INTRAVENOUS
  Filled 2019-07-29: qty 137

## 2019-07-29 MED ORDER — DEXAMETHASONE SODIUM PHOSPHATE 10 MG/ML IJ SOLN
10.0000 mg | Freq: Once | INTRAMUSCULAR | Status: AC
  Administered 2019-07-29: 10 mg via INTRAVENOUS

## 2019-07-29 MED ORDER — SODIUM CHLORIDE 0.9 % IV SOLN
15.0000 mg/kg | Freq: Once | INTRAVENOUS | Status: AC
  Administered 2019-07-29: 12:00:00 1000 mg via INTRAVENOUS
  Filled 2019-07-29: qty 32

## 2019-07-29 MED ORDER — SODIUM CHLORIDE 0.9 % IV SOLN
INTRAVENOUS | Status: DC
  Filled 2019-07-29: qty 250

## 2019-07-29 MED ORDER — PALONOSETRON HCL INJECTION 0.25 MG/5ML
INTRAVENOUS | Status: AC
  Filled 2019-07-29: qty 5

## 2019-07-29 MED ORDER — POTASSIUM CHLORIDE 2 MEQ/ML IV SOLN
Freq: Once | INTRAVENOUS | Status: AC
  Filled 2019-07-29: qty 10

## 2019-07-29 MED ORDER — SODIUM CHLORIDE 0.9 % IV SOLN
150.0000 mg | Freq: Once | INTRAVENOUS | Status: AC
  Administered 2019-07-29: 150 mg via INTRAVENOUS
  Filled 2019-07-29: qty 150

## 2019-07-29 MED ORDER — DEXAMETHASONE SODIUM PHOSPHATE 10 MG/ML IJ SOLN
INTRAMUSCULAR | Status: AC
  Filled 2019-07-29: qty 1

## 2019-07-29 NOTE — Progress Notes (Signed)
First treatment of Zirabev, Alimta, Cisplatin. Pt tolerated well. No complaints. Questions answered, printed material given.

## 2019-07-29 NOTE — Patient Instructions (Signed)
[facility] Discharge Instructions for Patients Receiving Chemotherapy  Today you received the following chemotherapy agents Zirabev, Alimta, Cisplatin.  To help prevent nausea and vomiting after your treatment, we encourage you to take your nausea medication DO NOT TAKE ZOFRAN FOR THREE DAYS AFTER TREATMENT.   If you develop nausea and vomiting that is not controlled by your nausea medication, call the clinic.   BELOW ARE SYMPTOMS THAT SHOULD BE REPORTED IMMEDIATELY:  *FEVER GREATER THAN 100.5 F  *CHILLS WITH OR WITHOUT FEVER  NAUSEA AND VOMITING THAT IS NOT CONTROLLED WITH YOUR NAUSEA MEDICATION  *UNUSUAL SHORTNESS OF BREATH  *UNUSUAL BRUISING OR BLEEDING  TENDERNESS IN MOUTH AND THROAT WITH OR WITHOUT PRESENCE OF ULCERS  *URINARY PROBLEMS  *BOWEL PROBLEMS  UNUSUAL RASH Items with * indicate a potential emergency and should be followed up as soon as possible.  Feel free to call the clinic should you have any questions or concerns. The clinic phone number is [phone].  Please show the [facility] at check-in to the Emergency Department and triage nurse.  Pemetrexed injection What is this medicine? PEMETREXED (PEM e TREX ed) is a chemotherapy drug used to treat lung cancers like non-small cell lung cancer and mesothelioma. It may also be used to treat other cancers. This medicine may be used for other purposes; ask your health care provider or pharmacist if you have questions. COMMON BRAND NAME(S): Alimta What should I tell my health care provider before I take this medicine? They need to know if you have any of these conditions:  infection (especially a virus infection such as chickenpox, cold sores, or herpes)  kidney disease  low blood counts, like low white cell, platelet, or red cell counts  lung or breathing disease, like asthma  radiation therapy  an unusual or allergic reaction to pemetrexed, other medicines, foods, dyes, or  preservative  pregnant or trying to get pregnant  breast-feeding How should I use this medicine? This drug is given as an infusion into a vein. It is administered in a hospital or clinic by a specially trained health care professional. Talk to your pediatrician regarding the use of this medicine in children. Special care may be needed. Overdosage: If you think you have taken too much of this medicine contact a poison control center or emergency room at once. NOTE: This medicine is only for you. Do not share this medicine with others. What if I miss a dose? It is important not to miss your dose. Call your doctor or health care professional if you are unable to keep an appointment. What may interact with this medicine? This medicine may interact with the following medications:  Ibuprofen This list may not describe all possible interactions. Give your health care provider a list of all the medicines, herbs, non-prescription drugs, or dietary supplements you use. Also tell them if you smoke, drink alcohol, or use illegal drugs. Some items may interact with your medicine. What should I watch for while using this medicine? Visit your doctor for checks on your progress. This drug may make you feel generally unwell. This is not uncommon, as chemotherapy can affect healthy cells as well as cancer cells. Report any side effects. Continue your course of treatment even though you feel ill unless your doctor tells you to stop. In some cases, you may be given additional medicines to help with side effects. Follow all directions for their use. Call your doctor or health care professional for advice if you get  a fever, chills or sore throat, or other symptoms of a cold or flu. Do not treat yourself. This drug decreases your body's ability to fight infections. Try to avoid being around people who are sick. This medicine may increase your risk to bruise or bleed. Call your doctor or health care professional if  you notice any unusual bleeding. Be careful brushing and flossing your teeth or using a toothpick because you may get an infection or bleed more easily. If you have any dental work done, tell your dentist you are receiving this medicine. Avoid taking products that contain aspirin, acetaminophen, ibuprofen, naproxen, or ketoprofen unless instructed by your doctor. These medicines may hide a fever. Call your doctor or health care professional if you get diarrhea or mouth sores. Do not treat yourself. To protect your kidneys, drink water or other fluids as directed while you are taking this medicine. Do not become pregnant while taking this medicine or for 6 months after stopping it. Women should inform their doctor if they wish to become pregnant or think they might be pregnant. Men should not father a child while taking this medicine and for 3 months after stopping it. This may interfere with the ability to father a child. You should talk to your doctor or health care professional if you are concerned about your fertility. There is a potential for serious side effects to an unborn child. Talk to your health care professional or pharmacist for more information. Do not breast-feed an infant while taking this medicine or for 1 week after stopping it. What side effects may I notice from receiving this medicine? Side effects that you should report to your doctor or health care professional as soon as possible:  allergic reactions like skin rash, itching or hives, swelling of the face, lips, or tongue  breathing problems  redness, blistering, peeling or loosening of the skin, including inside the mouth  signs and symptoms of bleeding such as bloody or black, tarry stools; red or dark-brown urine; spitting up blood or brown material that looks like coffee grounds; red spots on the skin; unusual bruising or bleeding from the eye, gums, or nose  signs and symptoms of infection like fever or chills; cough;  sore throat; pain or trouble passing urine  signs and symptoms of kidney injury like trouble passing urine or change in the amount of urine  signs and symptoms of liver injury like dark yellow or brown urine; general ill feeling or flu-like symptoms; light-colored stools; loss of appetite; nausea; right upper belly pain; unusually weak or tired; yellowing of the eyes or skin Side effects that usually do not require medical attention (report to your doctor or health care professional if they continue or are bothersome):  constipation  mouth sores  nausea, vomiting  unusually weak or tired This list may not describe all possible side effects. Call your doctor for medical advice about side effects. You may report side effects to FDA at 1-800-FDA-1088. Where should I keep my medicine? This drug is given in a hospital or clinic and will not be stored at home. NOTE: This sheet is a summary. It may not cover all possible information. If you have questions about this medicine, talk to your doctor, pharmacist, or health care provider.  2020 Elsevier/Gold Standard (2017-07-31 16:11:33) Cisplatin injection What is this medicine? CISPLATIN (SIS pla tin) is a chemotherapy drug. It targets fast dividing cells, like cancer cells, and causes these cells to die. This medicine is used to treat  many types of cancer like bladder, ovarian, and testicular cancers. This medicine may be used for other purposes; ask your health care provider or pharmacist if you have questions. COMMON BRAND NAME(S): Platinol, Platinol -AQ What should I tell my health care provider before I take this medicine? They need to know if you have any of these conditions:  eye disease, vision problems  hearing problems  kidney disease  low blood counts, like white cells, platelets, or red blood cells  tingling of the fingers or toes, or other nerve disorder  an unusual or allergic reaction to cisplatin, carboplatin, oxaliplatin,  other medicines, foods, dyes, or preservatives  pregnant or trying to get pregnant  breast-feeding How should I use this medicine? This drug is given as an infusion into a vein. It is administered in a hospital or clinic by a specially trained health care professional. Talk to your pediatrician regarding the use of this medicine in children. Special care may be needed. Overdosage: If you think you have taken too much of this medicine contact a poison control center or emergency room at once. NOTE: This medicine is only for you. Do not share this medicine with others. What if I miss a dose? It is important not to miss a dose. Call your doctor or health care professional if you are unable to keep an appointment. What may interact with this medicine? This medicine may interact with the following medications:  foscarnet  certain antibiotics like amikacin, gentamicin, neomycin, polymyxin B, streptomycin, tobramycin, vancomycin This list may not describe all possible interactions. Give your health care provider a list of all the medicines, herbs, non-prescription drugs, or dietary supplements you use. Also tell them if you smoke, drink alcohol, or use illegal drugs. Some items may interact with your medicine. What should I watch for while using this medicine? Your condition will be monitored carefully while you are receiving this medicine. You will need important blood work done while you are taking this medicine. This drug may make you feel generally unwell. This is not uncommon, as chemotherapy can affect healthy cells as well as cancer cells. Report any side effects. Continue your course of treatment even though you feel ill unless your doctor tells you to stop. This medicine may increase your risk of getting an infection. Call your healthcare professional for advice if you get a fever, chills, or sore throat, or other symptoms of a cold or flu. Do not treat yourself. Try to avoid being around  people who are sick. Avoid taking medicines that contain aspirin, acetaminophen, ibuprofen, naproxen, or ketoprofen unless instructed by your healthcare professional. These medicines may hide a fever. This medicine may increase your risk to bruise or bleed. Call your doctor or health care professional if you notice any unusual bleeding. Be careful brushing and flossing your teeth or using a toothpick because you may get an infection or bleed more easily. If you have any dental work done, tell your dentist you are receiving this medicine. Do not become pregnant while taking this medicine or for 14 months after stopping it. Women should inform their healthcare professional if they wish to become pregnant or think they might be pregnant. Men should not father a child while taking this medicine and for 11 months after stopping it. There is potential for serious side effects to an unborn child. Talk to your healthcare professional for more information. Do not breast-feed an infant while taking this medicine. This medicine has caused ovarian failure in some women.  This medicine may make it more difficult to get pregnant. Talk to your healthcare professional if you are concerned about your fertility. This medicine has caused decreased sperm counts in some men. This may make it more difficult to father a child. Talk to your healthcare professional if you are concerned about your fertility. Drink fluids as directed while you are taking this medicine. This will help protect your kidneys. Call your doctor or health care professional if you get diarrhea. Do not treat yourself. What side effects may I notice from receiving this medicine? Side effects that you should report to your doctor or health care professional as soon as possible:  allergic reactions like skin rash, itching or hives, swelling of the face, lips, or tongue  blurred vision  changes in vision  decreased hearing or ringing of the  ears  nausea, vomiting  pain, redness, or irritation at site where injected  pain, tingling, numbness in the hands or feet  signs and symptoms of bleeding such as bloody or black, tarry stools; red or dark brown urine; spitting up blood or brown material that looks like coffee grounds; red spots on the skin; unusual bruising or bleeding from the eyes, gums, or nose  signs and symptoms of infection like fever; chills; cough; sore throat; pain or trouble passing urine  signs and symptoms of kidney injury like trouble passing urine or change in the amount of urine  signs and symptoms of low red blood cells or anemia such as unusually weak or tired; feeling faint or lightheaded; falls; breathing problems Side effects that usually do not require medical attention (report to your doctor or health care professional if they continue or are bothersome):  loss of appetite  mouth sores  muscle cramps This list may not describe all possible side effects. Call your doctor for medical advice about side effects. You may report side effects to FDA at 1-800-FDA-1088. Where should I keep my medicine? This drug is given in a hospital or clinic and will not be stored at home. NOTE: This sheet is a summary. It may not cover all possible information. If you have questions about this medicine, talk to your doctor, pharmacist, or health care provider.  2020 Elsevier/Gold Standard (2018-06-06 15:59:17) Bevacizumab injection What is this medicine? BEVACIZUMAB (be va SIZ yoo mab) is a monoclonal antibody. It is used to treat many types of cancer. This medicine may be used for other purposes; ask your health care provider or pharmacist if you have questions. COMMON BRAND NAME(S): Avastin, MVASI, Zirabev What should I tell my health care provider before I take this medicine? They need to know if you have any of these conditions:  diabetes  heart disease  high blood pressure  history of coughing up  blood  prior anthracycline chemotherapy (e.g., doxorubicin, daunorubicin, epirubicin)  recent or ongoing radiation therapy  recent or planning to have surgery  stroke  an unusual or allergic reaction to bevacizumab, hamster proteins, mouse proteins, other medicines, foods, dyes, or preservatives  pregnant or trying to get pregnant  breast-feeding How should I use this medicine? This medicine is for infusion into a vein. It is given by a health care professional in a hospital or clinic setting. Talk to your pediatrician regarding the use of this medicine in children. Special care may be needed. Overdosage: If you think you have taken too much of this medicine contact a poison control center or emergency room at once. NOTE: This medicine is only for you. Do  not share this medicine with others. What if I miss a dose? It is important not to miss your dose. Call your doctor or health care professional if you are unable to keep an appointment. What may interact with this medicine? Interactions are not expected. This list may not describe all possible interactions. Give your health care provider a list of all the medicines, herbs, non-prescription drugs, or dietary supplements you use. Also tell them if you smoke, drink alcohol, or use illegal drugs. Some items may interact with your medicine. What should I watch for while using this medicine? Your condition will be monitored carefully while you are receiving this medicine. You will need important blood work and urine testing done while you are taking this medicine. This medicine may increase your risk to bruise or bleed. Call your doctor or health care professional if you notice any unusual bleeding. Before having surgery, talk to your health care provider to make sure it is ok. This drug can increase the risk of poor healing of your surgical site or wound. You will need to stop this drug for 28 days before surgery. After surgery, wait at least  28 days before restarting this drug. Make sure the surgical site or wound is healed enough before restarting this drug. Talk to your health care provider if questions. Do not become pregnant while taking this medicine or for 6 months after stopping it. Women should inform their doctor if they wish to become pregnant or think they might be pregnant. There is a potential for serious side effects to an unborn child. Talk to your health care professional or pharmacist for more information. Do not breast-feed an infant while taking this medicine and for 6 months after the last dose. This medicine has caused ovarian failure in some women. This medicine may interfere with the ability to have a child. You should talk to your doctor or health care professional if you are concerned about your fertility. What side effects may I notice from receiving this medicine? Side effects that you should report to your doctor or health care professional as soon as possible:  allergic reactions like skin rash, itching or hives, swelling of the face, lips, or tongue  chest pain or chest tightness  chills  coughing up blood  high fever  seizures  severe constipation  signs and symptoms of bleeding such as bloody or black, tarry stools; red or dark-brown urine; spitting up blood or brown material that looks like coffee grounds; red spots on the skin; unusual bruising or bleeding from the eye, gums, or nose  signs and symptoms of a blood clot such as breathing problems; chest pain; severe, sudden headache; pain, swelling, warmth in the leg  signs and symptoms of a stroke like changes in vision; confusion; trouble speaking or understanding; severe headaches; sudden numbness or weakness of the face, arm or leg; trouble walking; dizziness; loss of balance or coordination  stomach pain  sweating  swelling of legs or ankles  vomiting  weight gain Side effects that usually do not require medical attention (report  to your doctor or health care professional if they continue or are bothersome):  back pain  changes in taste  decreased appetite  dry skin  nausea  tiredness This list may not describe all possible side effects. Call your doctor for medical advice about side effects. You may report side effects to FDA at 1-800-FDA-1088. Where should I keep my medicine? This drug is given in a hospital or clinic  and will not be stored at home. NOTE: This sheet is a summary. It may not cover all possible information. If you have questions about this medicine, talk to your doctor, pharmacist, or health care provider.  2020 Elsevier/Gold Standard (2019-04-08 10:50:46)

## 2019-07-29 NOTE — Telephone Encounter (Signed)
Mario Harmon gave him 120 tablet less than 2 weeks ago.  He should not need any refill at this point.  He may need to ask Mario Harmon for refill for that huge amount when needed.

## 2019-07-29 NOTE — Telephone Encounter (Signed)
Pain med refills

## 2019-07-30 ENCOUNTER — Telehealth: Payer: Self-pay | Admitting: Medical Oncology

## 2019-07-30 ENCOUNTER — Encounter (HOSPITAL_COMMUNITY): Payer: Self-pay

## 2019-07-30 ENCOUNTER — Other Ambulatory Visit: Payer: Self-pay | Admitting: Radiation Oncology

## 2019-07-30 ENCOUNTER — Inpatient Hospital Stay (HOSPITAL_COMMUNITY)
Admission: EM | Admit: 2019-07-30 | Discharge: 2019-08-01 | DRG: 948 | Disposition: A | Discharge status: Home / Self Care | Payer: Medicare Other | Attending: Internal Medicine | Admitting: Internal Medicine

## 2019-07-30 ENCOUNTER — Other Ambulatory Visit: Payer: Self-pay

## 2019-07-30 ENCOUNTER — Telehealth: Payer: Self-pay | Admitting: *Deleted

## 2019-07-30 ENCOUNTER — Ambulatory Visit: Admission: RE | Admit: 2019-07-30 | Payer: Medicare Other | Source: Ambulatory Visit

## 2019-07-30 DIAGNOSIS — Z87891 Personal history of nicotine dependence: Secondary | ICD-10-CM

## 2019-07-30 DIAGNOSIS — C782 Secondary malignant neoplasm of pleura: Secondary | ICD-10-CM | POA: Diagnosis present

## 2019-07-30 DIAGNOSIS — C450 Mesothelioma of pleura: Secondary | ICD-10-CM | POA: Diagnosis present

## 2019-07-30 DIAGNOSIS — T451X5A Adverse effect of antineoplastic and immunosuppressive drugs, initial encounter: Secondary | ICD-10-CM | POA: Diagnosis present

## 2019-07-30 DIAGNOSIS — Z79891 Long term (current) use of opiate analgesic: Secondary | ICD-10-CM

## 2019-07-30 DIAGNOSIS — R112 Nausea with vomiting, unspecified: Secondary | ICD-10-CM | POA: Diagnosis not present

## 2019-07-30 DIAGNOSIS — G893 Neoplasm related pain (acute) (chronic): Secondary | ICD-10-CM | POA: Diagnosis not present

## 2019-07-30 DIAGNOSIS — Z79899 Other long term (current) drug therapy: Secondary | ICD-10-CM

## 2019-07-30 DIAGNOSIS — C7951 Secondary malignant neoplasm of bone: Secondary | ICD-10-CM | POA: Diagnosis present

## 2019-07-30 DIAGNOSIS — Z20822 Contact with and (suspected) exposure to covid-19: Secondary | ICD-10-CM | POA: Diagnosis present

## 2019-07-30 DIAGNOSIS — R591 Generalized enlarged lymph nodes: Secondary | ICD-10-CM | POA: Diagnosis present

## 2019-07-30 DIAGNOSIS — J90 Pleural effusion, not elsewhere classified: Secondary | ICD-10-CM | POA: Diagnosis present

## 2019-07-30 LAB — CBC WITH DIFFERENTIAL/PLATELET
Abs Immature Granulocytes: 0.07 10*3/uL (ref 0.00–0.07)
Basophils Absolute: 0 10*3/uL (ref 0.0–0.1)
Basophils Relative: 0 %
Eosinophils Absolute: 0 10*3/uL (ref 0.0–0.5)
Eosinophils Relative: 0 %
HCT: 35.7 % — ABNORMAL LOW (ref 39.0–52.0)
Hemoglobin: 11.2 g/dL — ABNORMAL LOW (ref 13.0–17.0)
Immature Granulocytes: 1 %
Lymphocytes Relative: 1 %
Lymphs Abs: 0.1 10*3/uL — ABNORMAL LOW (ref 0.7–4.0)
MCH: 28.9 pg (ref 26.0–34.0)
MCHC: 31.4 g/dL (ref 30.0–36.0)
MCV: 92 fL (ref 80.0–100.0)
Monocytes Absolute: 0.8 10*3/uL (ref 0.1–1.0)
Monocytes Relative: 7 %
Neutro Abs: 11.4 10*3/uL — ABNORMAL HIGH (ref 1.7–7.7)
Neutrophils Relative %: 91 %
Platelets: 484 10*3/uL — ABNORMAL HIGH (ref 150–400)
RBC: 3.88 MIL/uL — ABNORMAL LOW (ref 4.22–5.81)
RDW: 13.1 % (ref 11.5–15.5)
WBC: 12.4 10*3/uL — ABNORMAL HIGH (ref 4.0–10.5)
nRBC: 0 % (ref 0.0–0.2)

## 2019-07-30 LAB — COMPREHENSIVE METABOLIC PANEL
ALT: 35 U/L (ref 0–44)
AST: 22 U/L (ref 15–41)
Albumin: 2.9 g/dL — ABNORMAL LOW (ref 3.5–5.0)
Alkaline Phosphatase: 73 U/L (ref 38–126)
Anion gap: 9 (ref 5–15)
BUN: 25 mg/dL — ABNORMAL HIGH (ref 8–23)
CO2: 26 mmol/L (ref 22–32)
Calcium: 9.1 mg/dL (ref 8.9–10.3)
Chloride: 101 mmol/L (ref 98–111)
Creatinine, Ser: 0.98 mg/dL (ref 0.61–1.24)
GFR calc Af Amer: >60 mL/min (ref >60)
GFR calc non Af Amer: >60 mL/min (ref >60)
Glucose, Bld: 138 mg/dL — ABNORMAL HIGH (ref 70–99)
Potassium: 4.5 mmol/L (ref 3.5–5.1)
Sodium: 136 mmol/L (ref 135–145)
Total Bilirubin: 0.6 mg/dL (ref 0.3–1.2)
Total Protein: 6.8 g/dL (ref 6.5–8.1)

## 2019-07-30 LAB — LIPASE, BLOOD: Lipase: 18 U/L (ref 11–51)

## 2019-07-30 MED ORDER — OXYCODONE HCL 5 MG PO TABS: 5.0000 mg | ORAL_TABLET | ORAL | 0 refills | Status: DC | PRN

## 2019-07-30 MED ORDER — SODIUM CHLORIDE 0.9 % IV BOLUS
1000.0000 mL | Freq: Once | INTRAVENOUS | Status: AC
  Administered 2019-07-30: 1000 mL via INTRAVENOUS

## 2019-07-30 MED ORDER — SODIUM CHLORIDE 0.9 % IV SOLN: INTRAVENOUS | Status: AC

## 2019-07-30 MED ORDER — ONDANSETRON HCL 4 MG/2ML IJ SOLN
4.0000 mg | Freq: Once | INTRAMUSCULAR | Status: AC
  Administered 2019-07-30: 4 mg via INTRAVENOUS
  Filled 2019-07-30: qty 2

## 2019-07-30 NOTE — ED Provider Notes (Signed)
[facility] DEPT Provider Note   CSN: [id number] Arrival date & time: 07/30/19  2106     History Chief Complaint  Patient presents with  . Emesis    Mario Harmon is a 74 y.o. male.  HPI   Pt had a chemo treatment yesterday.  He has been nauseated all day since then.   He tried taking an antiemetic around 5pm but it did not help and he vomited.  He tried another dose but he continued to vomit afterwards.  He has not been able to eat or drink since 5 pm.    He called the oncology office tonight and was told to come to the ED.  Last emesis was around 7pm.  No fever.  No pain.  Past Medical History:  Diagnosis Date  . Cancer [facility])     Patient Active Problem List   Diagnosis Date Noted  . 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 Goals of care, counseling/discussion 07/21/2019  . Encounter for antineoplastic chemotherapy 07/21/2019  . Pleural effusion on right 06/29/2019  . 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 No pertinent surgical history.     History reviewed. No pertinent family history.  Social History   Tobacco Use  . Smoking status: Light Tobacco Smoker    Types: Pipe  . Smokeless tobacco: Never Used  . Tobacco comment: many years ago he smoked a pipe.  Substance Use Topics  . Alcohol use: Not Currently  . Drug use: Never    Home Medications Prior to Admission medications   Medication Sig Start Date End Date Taking? Authorizing Provider  dexamethasone (DECADRON) 4 MG tablet 1 tablet p.o. twice daily the day before, day of and day after chemotherapy every 3 weeks. 07/21/19  Yes Curt Bears, MD  folic acid (FOLVITE) 1 MG tablet Take 1 tablet (1 mg total) by mouth daily. 07/21/19  Yes Curt Bears, MD  morphine (MS CONTIN) 60 MG 12 hr tablet Take 1 tablet (60 mg total) by mouth every 12 (twelve) hours. 07/20/19  Yes  Hayden Pedro, PA-C  OVER THE COUNTER MEDICATION Take 1 tablet by mouth 2 (two) times daily. Prostate health supplement   Yes [provider]  oxyCODONE (OXY IR/ROXICODONE) 5 MG immediate release tablet Take 1-2 tablets (5-10 mg total) by mouth every 4 (four) hours as needed for severe pain. 07/30/19  Yes Hayden Pedro, PA-C  prochlorperazine (COMPAZINE) 10 MG tablet Take 1 tablet (10 mg total) by mouth every 6 (six) hours as needed for nausea or vomiting. 07/21/19  Yes Curt Bears, MD  magnesium citrate SOLN Take 296 mLs (1 Bottle total) by mouth once. Patient not taking: Reported on 07/30/2019 12/10/15   Gareth Morgan, MD    Allergies    Patient has no known allergies.  Review of Systems   Review of Systems  All other systems reviewed and are negative.   Physical Exam Updated Vital Signs BP (!) 152/82   Pulse 73   Temp 97.6 F (36.4 C) (Oral)   Resp 18   Ht 1.727 m (5\' 8" )   Wt 69 kg   SpO2 98%   BMI 23.13 kg/m   Physical Exam Vitals and nursing note reviewed.  Constitutional:      Appearance: He is well-developed. He is ill-appearing.  HENT:     Head: Normocephalic and atraumatic.     Right Ear: External ear  normal.     Left Ear: External ear normal.  Eyes:     General: No scleral icterus.       Right eye: No discharge.        Left eye: No discharge.     Conjunctiva/sclera: Conjunctivae normal.  Neck:     Trachea: No tracheal deviation.  Cardiovascular:     Rate and Rhythm: Normal rate.  Pulmonary:     Effort: Pulmonary effort is normal. No respiratory distress.     Breath sounds: No stridor.  Abdominal:     General: There is no distension.     Comments: Dry heaves   Musculoskeletal:        General: No swelling or deformity.     Cervical back: Neck supple.  Skin:    General: Skin is warm and dry.     Findings: No rash.  Neurological:     Mental Status: He is alert.     Cranial Nerves: Cranial nerve deficit: no gross  deficits.     ED Results / Procedures / Treatments   Labs (all labs ordered are listed, but only abnormal results are displayed) Labs Reviewed  CBC WITH DIFFERENTIAL/PLATELET - Abnormal; Notable for the following components:      Result Value   WBC 12.4 (*)    RBC 3.88 (*)    Hemoglobin 11.2 (*)    HCT 35.7 (*)    Platelets 484 (*)    Neutro Abs 11.4 (*)    Lymphs Abs 0.1 (*)    All other components within normal limits  COMPREHENSIVE METABOLIC PANEL - Abnormal; Notable for the following components:   Glucose, Bld 138 (*)    BUN 25 (*)    Albumin 2.9 (*)    All other components within normal limits  SARS CORONAVIRUS 2 (TAT 6-24 HRS)  LIPASE, BLOOD    EKG None  Radiology No results found.  Procedures Procedures (including critical care time)  Medications Ordered in ED Medications  0.9 %  sodium chloride infusion ( Intravenous New Bag/Given 07/30/19 2358)  morphine 4 MG/ML injection 4 mg (has no administration in time range)  sodium chloride 0.9 % bolus 1,000 mL (0 mLs Intravenous Stopped 07/30/19 2332)  ondansetron (ZOFRAN) injection 4 mg (4 mg Intravenous Given 07/30/19 2206)  ondansetron (ZOFRAN) injection 4 mg (4 mg Intravenous Given 07/30/19 2358)    ED Course  I have reviewed the triage vital signs and the nursing notes.  Pertinent labs & imaging results that were available during my care of the patient were reviewed by me and considered in my medical decision making (see chart for details).  Clinical Course as of Jul 30 33  Thu Jul 30, 2019  2351 Patient initially felt better but vomited again after p.o. challenge.  Will give additional antiemetics   [JK]    Clinical Course User Index [JK] Dorie Rank, MD   MDM Rules/Calculators/A&P                      Patient presented to the ED with complaints of vomiting after recent chemotherapy treatment.  Patient's laboratory test showed mild leukocytosis and elevation in his BUN.  Patient was treated with IV fluids  and antiemetics but unfortunately continued to vomit.  Patient is unable to keep anything down orally.  He is also not able to keep down his chronic pain medications.  I suspect his symptoms are related to his chemotherapy.  Considering his persistent nausea and inability  to keep down any fluids will consult the medical service for admission, overnight hydration and symptom management. Final Clinical Impression(s) / ED Diagnoses Final diagnoses:  Chemotherapy induced nausea and vomiting      Dorie Rank, MD 07/31/19 [phone]

## 2019-07-30 NOTE — Progress Notes (Signed)
The patient has been taking long-acting morphine.  Unfortunately a little over a week ago, the patient was identified as not having good relief despite his long-acting regimen of morphine and hydrocodone.  We switched his medication for short acting to oxycodone, while some of his pain had improved, it was felt that he was still requiring more short acting and not having the full benefit of long-acting morphine.  I increased his morphine to 60 mg twice daily in the hopes of not needing as many oxycodone breakthrough doses, the patient went to the pharmacy and the pharmacy would not fill the 60 mg.  I did speak with the pharmacist at the pharmacy where he tried filling things, there was a misunderstanding about the change in therapy and so he was not given this prescription, rather he was encouraged to start taking 2 of the 30 mg morphine tablets to total 60mg  twice a day.  The part patient was a bit confused about the scheduling of the long-acting medication and in fact he has been taking 30 mg 4 times a day, nursing has clarified that this is not how it should be taken and that is off label, I do recommend he go back to taking 60 mg twice daily which would be 2 tablets at a time until he can fill his 60 mg prescription.  I spoke with Juliann Pulse the pharmacist at [facility] on [address] at [facility] in [place], she is now aware of the changes that were made and will fill his 60 mg morphine prescription, I have refilled his oxycodone as well, my hope is that within the next 24 hours he will have a better distribution of his long-acting medication so he is not having to take oxycodone as frequently.  That being said he is still taking the short acting oxycodone within the prescribed frequency and dosing that I have recommended.  I have given him another 120 mg of oxycodone which is a 10-day supply with the hopes that we will be able to decrease both short and long-acting medication at the completion of his radiotherapy.

## 2019-07-30 NOTE — Telephone Encounter (Signed)
Refill request sent to [facility].

## 2019-07-30 NOTE — ED Notes (Signed)
Pt experienced an episode of vomiting after PO challenge, Dr. Tomi Bamberger notified.

## 2019-07-30 NOTE — ED Notes (Signed)
Pt provided with crackers and juice for PO Challenge.

## 2019-07-30 NOTE — ED Triage Notes (Signed)
Pt reports intermittent N/V throughout the day. Pt started new chemo yesterday. Pt called oncology doctor and was told to come here.

## 2019-07-30 NOTE — ED Notes (Signed)
Pt ambulatory to and from bathroom with a steady gait.  

## 2019-07-30 NOTE — Telephone Encounter (Signed)
Left a voicemail asking the patient to call me back at [phone] to discuss his pain medication regimen.  Will continue to follow as necessary.  Gloriajean Dell. Leonie Green, BSN

## 2019-07-30 NOTE — Telephone Encounter (Signed)
Can you please find out how the patient is taking his meds. I suspect this inbasket is asking for a refill. I gave him enough medication if he's taking 2 pills every 4 hours for 12 days. So he may actually be out of the medication... but he should also be taking the long acting that we also increased.... so I'm just concerned what he's really taking, because the increase in the long acting should have improved his pain to a point where he'd need less short acting oxycodone. His pain though is very real and I can't imagine how bad that would feel.

## 2019-07-30 NOTE — Telephone Encounter (Signed)
err

## 2019-07-31 ENCOUNTER — Ambulatory Visit: Payer: Medicare Other

## 2019-07-31 ENCOUNTER — Telehealth: Payer: Self-pay | Admitting: *Deleted

## 2019-07-31 ENCOUNTER — Telehealth: Payer: Self-pay | Admitting: Medical Oncology

## 2019-07-31 ENCOUNTER — Observation Stay (HOSPITAL_COMMUNITY): Payer: Medicare Other

## 2019-07-31 ENCOUNTER — Encounter (HOSPITAL_COMMUNITY): Payer: Self-pay | Admitting: Internal Medicine

## 2019-07-31 DIAGNOSIS — C450 Mesothelioma of pleura: Secondary | ICD-10-CM | POA: Diagnosis present

## 2019-07-31 DIAGNOSIS — Z79899 Other long term (current) drug therapy: Secondary | ICD-10-CM | POA: Diagnosis not present

## 2019-07-31 DIAGNOSIS — R591 Generalized enlarged lymph nodes: Secondary | ICD-10-CM | POA: Diagnosis present

## 2019-07-31 DIAGNOSIS — C7951 Secondary malignant neoplasm of bone: Secondary | ICD-10-CM | POA: Diagnosis present

## 2019-07-31 DIAGNOSIS — T451X5A Adverse effect of antineoplastic and immunosuppressive drugs, initial encounter: Secondary | ICD-10-CM | POA: Diagnosis present

## 2019-07-31 DIAGNOSIS — Z20822 Contact with and (suspected) exposure to covid-19: Secondary | ICD-10-CM | POA: Diagnosis present

## 2019-07-31 DIAGNOSIS — G893 Neoplasm related pain (acute) (chronic): Secondary | ICD-10-CM | POA: Diagnosis present

## 2019-07-31 DIAGNOSIS — J90 Pleural effusion, not elsewhere classified: Secondary | ICD-10-CM | POA: Diagnosis present

## 2019-07-31 DIAGNOSIS — Z79891 Long term (current) use of opiate analgesic: Secondary | ICD-10-CM | POA: Diagnosis not present

## 2019-07-31 DIAGNOSIS — R112 Nausea with vomiting, unspecified: Secondary | ICD-10-CM | POA: Diagnosis present

## 2019-07-31 DIAGNOSIS — C782 Secondary malignant neoplasm of pleura: Secondary | ICD-10-CM | POA: Diagnosis present

## 2019-07-31 DIAGNOSIS — Z87891 Personal history of nicotine dependence: Secondary | ICD-10-CM | POA: Diagnosis not present

## 2019-07-31 LAB — COMPREHENSIVE METABOLIC PANEL
ALT: 28 U/L (ref 0–44)
AST: 22 U/L (ref 15–41)
Albumin: 2.5 g/dL — ABNORMAL LOW (ref 3.5–5.0)
Alkaline Phosphatase: 63 U/L (ref 38–126)
Anion gap: 7 (ref 5–15)
BUN: 21 mg/dL (ref 8–23)
CO2: 27 mmol/L (ref 22–32)
Calcium: 8.6 mg/dL — ABNORMAL LOW (ref 8.9–10.3)
Chloride: 102 mmol/L (ref 98–111)
Creatinine, Ser: 0.96 mg/dL (ref 0.61–1.24)
GFR calc Af Amer: >60 mL/min (ref >60)
GFR calc non Af Amer: >60 mL/min (ref >60)
Glucose, Bld: 114 mg/dL — ABNORMAL HIGH (ref 70–99)
Potassium: 4.5 mmol/L (ref 3.5–5.1)
Sodium: 136 mmol/L (ref 135–145)
Total Bilirubin: 1 mg/dL (ref 0.3–1.2)
Total Protein: 5.8 g/dL — ABNORMAL LOW (ref 6.5–8.1)

## 2019-07-31 LAB — CBC
HCT: 32.8 % — ABNORMAL LOW (ref 39.0–52.0)
Hemoglobin: 10.4 g/dL — ABNORMAL LOW (ref 13.0–17.0)
MCH: 29.5 pg (ref 26.0–34.0)
MCHC: 31.7 g/dL (ref 30.0–36.0)
MCV: 93.2 fL (ref 80.0–100.0)
Platelets: 430 10*3/uL — ABNORMAL HIGH (ref 150–400)
RBC: 3.52 MIL/uL — ABNORMAL LOW (ref 4.22–5.81)
RDW: 13.1 % (ref 11.5–15.5)
WBC: 9.9 10*3/uL (ref 4.0–10.5)
nRBC: 0 % (ref 0.0–0.2)

## 2019-07-31 LAB — SARS CORONAVIRUS 2 (TAT 6-24 HRS): SARS Coronavirus 2: NEGATIVE

## 2019-07-31 LAB — TROPONIN I (HIGH SENSITIVITY): Troponin I (High Sensitivity): 3 ng/L (ref <18)

## 2019-07-31 MED ORDER — PROMETHAZINE HCL 25 MG/ML IJ SOLN
12.5000 mg | Freq: Once | INTRAMUSCULAR | Status: AC
  Administered 2019-07-31: 12.5 mg via INTRAVENOUS
  Filled 2019-07-31: qty 1

## 2019-07-31 MED ORDER — FAMOTIDINE IN NACL 20-0.9 MG/50ML-% IV SOLN
20.0000 mg | Freq: Two times a day (BID) | INTRAVENOUS | Status: DC
  Administered 2019-07-31 (×2): 20 mg via INTRAVENOUS
  Filled 2019-07-31 (×3): qty 50

## 2019-07-31 MED ORDER — MORPHINE SULFATE (PF) 2 MG/ML IV SOLN
2.0000 mg | INTRAVENOUS | Status: DC | PRN
  Administered 2019-07-31 – 2019-08-01 (×5): 2 mg via INTRAVENOUS
  Filled 2019-07-31 (×5): qty 1

## 2019-07-31 MED ORDER — SODIUM CHLORIDE 0.9 % IV SOLN: INTRAVENOUS | Status: AC

## 2019-07-31 MED ORDER — PROCHLORPERAZINE EDISYLATE 10 MG/2ML IJ SOLN
10.0000 mg | INTRAMUSCULAR | Status: DC | PRN
  Administered 2019-07-31: 10 mg via INTRAVENOUS

## 2019-07-31 MED ORDER — ACETAMINOPHEN 650 MG RE SUPP: 650.0000 mg | Freq: Four times a day (QID) | RECTAL | Status: DC | PRN

## 2019-07-31 MED ORDER — KETOROLAC TROMETHAMINE 15 MG/ML IJ SOLN: 15.0000 mg | Freq: Four times a day (QID) | INTRAMUSCULAR | Status: DC | PRN

## 2019-07-31 MED ORDER — PROCHLORPERAZINE EDISYLATE 10 MG/2ML IJ SOLN
10.0000 mg | INTRAMUSCULAR | Status: AC
  Administered 2019-07-31 (×2): 10 mg via INTRAVENOUS
  Filled 2019-07-31 (×2): qty 2

## 2019-07-31 MED ORDER — MORPHINE SULFATE (PF) 4 MG/ML IV SOLN
4.0000 mg | Freq: Once | INTRAVENOUS | Status: AC
  Administered 2019-07-31: 4 mg via INTRAVENOUS
  Filled 2019-07-31: qty 1

## 2019-07-31 MED ORDER — ONDANSETRON HCL 4 MG/2ML IJ SOLN: 4.0000 mg | Freq: Four times a day (QID) | INTRAMUSCULAR | Status: DC | PRN

## 2019-07-31 MED ORDER — PROMETHAZINE HCL 25 MG/ML IJ SOLN: 12.5000 mg | Freq: Four times a day (QID) | INTRAMUSCULAR | Status: DC | PRN

## 2019-07-31 MED ORDER — ENOXAPARIN SODIUM 40 MG/0.4ML ~~LOC~~ SOLN
40.0000 mg | Freq: Every day | SUBCUTANEOUS | Status: DC
  Administered 2019-07-31: 40 mg via SUBCUTANEOUS
  Filled 2019-07-31 (×2): qty 0.4

## 2019-07-31 MED ORDER — PROCHLORPERAZINE EDISYLATE 10 MG/2ML IJ SOLN
10.0000 mg | INTRAMUSCULAR | Status: DC
  Administered 2019-07-31: 10 mg via INTRAVENOUS
  Filled 2019-07-31 (×2): qty 2

## 2019-07-31 MED ORDER — ACETAMINOPHEN 325 MG PO TABS
650.0000 mg | ORAL_TABLET | Freq: Four times a day (QID) | ORAL | Status: DC | PRN
  Filled 2019-07-31: qty 2

## 2019-07-31 NOTE — ED Notes (Signed)
Pt transported to Xray. 

## 2019-07-31 NOTE — ED Notes (Signed)
Patient ambulated to RR and back without assistance, stated he thinks his nausea is getting better. Voiced concern that he thinks nothing is happening with his care and he is just lying in the ED, explained we are waiting for a bed upstairs.

## 2019-07-31 NOTE — Progress Notes (Signed)
Pt resting after pain med. SRP RN

## 2019-07-31 NOTE — ED Notes (Signed)
Report given to [place], Therapist, sports.

## 2019-07-31 NOTE — H&P (Addendum)
[facility] H&P    Patient Demographics:    Mario Harmon, is a 74 y.o. male  MRN: [id number]  DOB - 06/17/1946  Admit Date - 07/30/2019  Referring MD/NP/PA:  Marye Round  Outpatient Primary MD for the patient is Patient, No Pcp Per  Patient coming from:  home  Chief complaint- n/v   HPI:    Mario Harmon  is a 74 y.o. male,  w malignant pleural mesothelioma dx 06/2019 (multiple large right pleural based masses w chest wall invasion, rib erosions and anterior mediastinal mass and right internal mammary lymphadenopathy), on chemo w cisplatin, alimta and avastatin q3 weeks apparently presents with intractable nausea and vomitting per ED  Pt denies fever, chills, cough, cp, palp, sob, abd pain, diarrhea, brbpr, black stool, dysuria, hematuria.   Pt tx with zofran 4mg  iv x2 and still having n/v    In ED,   Xray Abd/ pelvis IMPRESSION: Large stool burden.  Nonobstructive bowel gas pattern.  CXR IMPRESSION: 1. No acute cardiopulmonary process. 2. Relatively stable appearance of a right-sided pleural effusion and multiple right-sided pulmonary masses.  Na 136, K 4.5,  Bun 25, Creatinine 0.98  Ast 22, Alt 35 Trop 3 Wbc 12.4, Hgb 11.2, Plt 484  Pt will be admitted for intractable n/v, likely related to chemo.    Review of systems:    In addition to the HPI above,  No Fever-chills, No Headache, No changes with Vision or hearing, No problems swallowing food or Liquids, No Chest pain, Cough or Shortness of Breath, No Abdominal pain,  bowel movements are regular, No Blood in stool or Urine, No dysuria, No new skin rashes or bruises, No new joints pains-aches,  No new weakness, tingling, numbness in any extremity, No recent weight gain or loss, No polyuria, polydypsia or polyphagia, No significant Mental Stressors.  All other systems reviewed and are negative.    Past History of the following :      Past Medical History:  Diagnosis Date  . Cancer [facility])       History reviewed. No pertinent surgical history.    Social History:      Social History   Tobacco Use  . Smoking status: Light Tobacco Smoker    Types: Pipe  . Smokeless tobacco: Never Used  . Tobacco comment: many years ago he smoked a pipe.  Substance Use Topics  . Alcohol use: Not Currently       Family History :    History reviewed. No pertinent family history.  pt is unable to provide   Home Medications:   Prior to Admission medications   Medication Sig Start Date End Date Taking? Authorizing Provider  dexamethasone (DECADRON) 4 MG tablet 1 tablet p.o. twice daily the day before, day of and day after chemotherapy every 3 weeks. 07/21/19  Yes Curt Bears, MD  folic acid (FOLVITE) 1 MG tablet Take 1 tablet (1 mg total) by mouth daily. 07/21/19  Yes Curt Bears, MD  morphine (MS CONTIN) 60 MG 12 hr tablet Take 1 tablet (60 mg total)  by mouth every 12 (twelve) hours. 07/20/19  Yes Hayden Pedro, PA-C  OVER THE COUNTER MEDICATION Take 1 tablet by mouth 2 (two) times daily. Prostate health supplement   Yes [provider]  oxyCODONE (OXY IR/ROXICODONE) 5 MG immediate release tablet Take 1-2 tablets (5-10 mg total) by mouth every 4 (four) hours as needed for severe pain. 07/30/19  Yes Hayden Pedro, PA-C  prochlorperazine (COMPAZINE) 10 MG tablet Take 1 tablet (10 mg total) by mouth every 6 (six) hours as needed for nausea or vomiting. 07/21/19  Yes Curt Bears, MD  magnesium citrate SOLN Take 296 mLs (1 Bottle total) by mouth once. Patient not taking: Reported on 07/30/2019 12/10/15   Gareth Morgan, MD     Allergies:    No Known Allergies   Physical Exam:   Vitals  Blood pressure (!) 152/82, pulse 73, temperature 97.6 F (36.4 C), temperature source Oral, resp. rate 18, height 5\' 8"  (1.727 m), weight 69 kg, SpO2 98 %.  1.  General: axoxo3  2.  Psychiatric: euthymic  3. Neurologic: nonfocal  4. HEENMT:  Anicteric, pupils 1.69mm symmetric, direct, consensual intact Neck: no jvd  5. Respiratory : CTAB  6. Cardiovascular : rrr s1, s2, no m/g/r  7. Gastrointestinal:  Abd: soft, nt, nd, +bs  8. Skin:  Ext: no c/c/e, no rash  9.Musculoskeletal:  Good ROM    Data Review:    CBC Recent Labs  Lab 07/29/19 0731 07/30/19 2208  WBC 11.5* 12.4*  HGB 11.7* 11.2*  HCT 36.7* 35.7*  PLT 492* 484*  MCV 90.8 92.0  MCH 29.0 28.9  MCHC 31.9 31.4  RDW 12.5 13.1  LYMPHSABS 0.1* 0.1*  MONOABS 0.9 0.8  EOSABS 0.0 0.0  BASOSABS 0.0 0.0   ------------------------------------------------------------------------------------------------------------------  Results for orders placed or performed during the hospital encounter of 07/30/19 (from the past 48 hour(s))  CBC with Differential/Platelet     Status: Abnormal   Collection Time: 07/30/19 10:08 PM  Result Value Ref Range   WBC 12.4 (H) 4.0 - 10.5 K/uL   RBC 3.88 (L) 4.22 - 5.81 MIL/uL   Hemoglobin 11.2 (L) 13.0 - 17.0 g/dL   HCT 35.7 (L) 39.0 - 52.0 %   MCV 92.0 80.0 - 100.0 fL   MCH 28.9 26.0 - 34.0 pg   MCHC 31.4 30.0 - 36.0 g/dL   RDW 13.1 11.5 - 15.5 %   Platelets 484 (H) 150 - 400 K/uL   nRBC 0.0 0.0 - 0.2 %   Neutrophils Relative % 91 %   Neutro Abs 11.4 (H) 1.7 - 7.7 K/uL   Lymphocytes Relative 1 %   Lymphs Abs 0.1 (L) 0.7 - 4.0 K/uL   Monocytes Relative 7 %   Monocytes Absolute 0.8 0.1 - 1.0 K/uL   Eosinophils Relative 0 %   Eosinophils Absolute 0.0 0.0 - 0.5 K/uL   Basophils Relative 0 %   Basophils Absolute 0.0 0.0 - 0.1 K/uL   Immature Granulocytes 1 %   Abs Immature Granulocytes 0.07 0.00 - 0.07 K/uL    Comment: Performed at [facility], [place] [address], [place], [place] [address]  Comprehensive metabolic panel     Status: Abnormal   Collection Time: 07/30/19 10:08 PM  Result Value Ref Range   Sodium 136 135 - 145 mmol/L    Potassium 4.5 3.5 - 5.1 mmol/L   Chloride 101 98 - 111 mmol/L   CO2 26 22 - 32 mmol/L   Glucose, Bld 138 (  H) 70 - 99 mg/dL   BUN 25 (H) 8 - 23 mg/dL   Creatinine, Ser 0.98 0.61 - 1.24 mg/dL   Calcium 9.1 8.9 - 10.3 mg/dL   Total Protein 6.8 6.5 - 8.1 g/dL   Albumin 2.9 (L) 3.5 - 5.0 g/dL   AST 22 15 - 41 U/L   ALT 35 0 - 44 U/L   Alkaline Phosphatase 73 38 - 126 U/L   Total Bilirubin 0.6 0.3 - 1.2 mg/dL   GFR calc non Af Amer >60 >60 mL/min   GFR calc Af Amer >60 >60 mL/min   Anion gap 9 5 - 15    Comment: Performed at [facility], [place] [address], [place], [place] [address]  Lipase, blood     Status: None   Collection Time: 07/30/19 10:08 PM  Result Value Ref Range   Lipase 18 11 - 51 U/L    Comment: Performed at [facility], [place] [place], [place] [address]    Chemistries  Recent Labs  Lab 07/29/19 [phone] 07/30/19 2208  NA 140 136  K 4.1 4.5  CL 102 101  CO2 26 26  GLUCOSE 183* 138*  BUN 18 25*  CREATININE 0.88 0.98  CALCIUM 9.5 9.1  MG 2.2  --   AST 22 22  ALT 34 35  ALKPHOS 93 73  BILITOT 0.3 0.6   ------------------------------------------------------------------------------------------------------------------  ------------------------------------------------------------------------------------------------------------------ GFR: Estimated Creatinine Clearance: 66.9 mL/min (by C-G formula based on SCr of 0.98 mg/dL). Liver Function Tests: Recent Labs  Lab 07/29/19 0731 07/30/19 2208  AST 22 22  ALT 34 35  ALKPHOS 93 73  BILITOT 0.3 0.6  PROT 7.2 6.8  ALBUMIN 2.9* 2.9*   Recent Labs  Lab 07/30/19 2208  LIPASE 18   No results for input(s): AMMONIA in the last 168 hours. Coagulation Profile: No results for input(s): INR, PROTIME in the last 168 hours. Cardiac Enzymes: No results for input(s): CKTOTAL, CKMB, CKMBINDEX, TROPONINI in the last 168 hours. BNP (last 3 results) No results for input(s):  PROBNP in the last 8760 hours. HbA1C: No results for input(s): HGBA1C in the last 72 hours. CBG: No results for input(s): GLUCAP in the last 168 hours. Lipid Profile: No results for input(s): CHOL, HDL, LDLCALC, TRIG, CHOLHDL, LDLDIRECT in the last 72 hours. Thyroid Function Tests: No results for input(s): TSH, T4TOTAL, FREET4, T3FREE, THYROIDAB in the last 72 hours. Anemia Panel: No results for input(s): VITAMINB12, FOLATE, FERRITIN, TIBC, IRON, RETICCTPCT in the last 72 hours.  --------------------------------------------------------------------------------------------------------------- Urine analysis:    Component Value Date/Time   COLORURINE YELLOW 12/10/2015 1150   APPEARANCEUR CLOUDY (A) 12/10/2015 1150   LABSPEC 1.026 12/10/2015 1150   PHURINE 5.5 12/10/2015 1150   GLUCOSEU NEGATIVE 12/10/2015 1150   HGBUR LARGE (A) 12/10/2015 1150   BILIRUBINUR NEGATIVE 12/10/2015 1150   KETONESUR 15 (A) 12/10/2015 1150   PROTEINUR NEGATIVE 07/29/2019 0745   NITRITE NEGATIVE 12/10/2015 1150   LEUKOCYTESUR NEGATIVE 12/10/2015 1150      Imaging Results:    No results found.     Assessment & Plan:    Principal Problem:   Nausea & vomiting  N/v Zofran 4mg  iv q6h prn  Phenergan 12.5mg  iv q6h prn n/v, if zofran not effective pepcid 20mg  iv bid Consider imaging of brain if continues to have persistent n/v  Malignant mesothelioma Please f/u with oncology  DVT Prophylaxis-   Lovenox - SCDs   AM Labs Ordered, also please review Full Orders  Family Communication: Admission, patients condition and plan of care including tests being ordered have been discussed with the patient who indicate understanding and agree with the plan and Code Status.  Code Status:  FULL CODE per patient  Admission status: Observation : Based on patients clinical presentation and evaluation of above clinical data, I have made determination that patient meets observation criteria at this time.     Time  spent in minutes : 55 minutes   Jani Gravel M.D on 07/31/2019 at 12:59 AM

## 2019-07-31 NOTE — ED Notes (Signed)
Attempted to call report to [facility], RN, no response, will try again later.

## 2019-07-31 NOTE — ED Notes (Addendum)
Cancer center called regarding patient's radiation treatment scheduled for 11a today, patient declined this treatment, stated he is not felling up to it today.

## 2019-07-31 NOTE — Progress Notes (Addendum)
This is a 74 year old male with past medical history of malignant pleural mesothelioma diagnosed January 2021 with multiple large right pleural-based masses with chest wall invasion, rib erosions and anterior mediastinal mass and right internal mammary lymphadenopathy recently started on chemotherapy this past 2/3, patient of Dr. Earlie Server who was admitted early this morning by Dr. Maudie Mercury for intractable nausea and vomiting related to chemotherapy.  Today patient had severe right back pain consistent with his chronic pain related to mesothelioma.  Improved with morphine.  Stated his nausea has improved somewhat but still persistent requiring Compazine throughout the day.  Later this afternoon patient requested to advance his diet and clear liquid diet was started.  I left a message with patient's oncology nurse as he is still undergoing active chemotherapy.  will admit patient for pain management and continue to monitor nausea/vomiting and advance diet as tolerated.  Hopeful discharge in next 24 to 48 hours.  Discussed with patient's wife over the phone  Marva Panda, DO [facility] Pager: [phone]

## 2019-07-31 NOTE — Telephone Encounter (Signed)
Pt  in ED . He called to cancel xrt today .Message forwarded to XRT.

## 2019-07-31 NOTE — Telephone Encounter (Signed)
Spoke with the patient to inform him of changes made to his pain medication.  I let him know a new script for morphine and oxycodone was sent to his pharmacy.  I instructed him to take the 60 mg tablet of morphine once in the morning and once at night.  He was instructed not to break up the tablets and take 30 mg 4 times daily.  He verbalized understanding.  Will continue to follow as necessary.  Gloriajean Dell. Leonie Green, BSN

## 2019-07-31 NOTE — Progress Notes (Signed)
Pt continue to c/o of pain and discomfort, gave pain meds 15 minutes early to help stay ahead of the pain. Will cont to monitor. SRP, RN

## 2019-08-01 LAB — BASIC METABOLIC PANEL
Anion gap: 9 (ref 5–15)
BUN: 18 mg/dL (ref 8–23)
CO2: 25 mmol/L (ref 22–32)
Calcium: 8.7 mg/dL — ABNORMAL LOW (ref 8.9–10.3)
Chloride: 100 mmol/L (ref 98–111)
Creatinine, Ser: 0.99 mg/dL (ref 0.61–1.24)
GFR calc Af Amer: >60 mL/min (ref >60)
GFR calc non Af Amer: >60 mL/min (ref >60)
Glucose, Bld: 93 mg/dL (ref 70–99)
Potassium: 4.3 mmol/L (ref 3.5–5.1)
Sodium: 134 mmol/L — ABNORMAL LOW (ref 135–145)

## 2019-08-01 LAB — CBC
HCT: 39.8 % (ref 39.0–52.0)
Hemoglobin: 12.6 g/dL — ABNORMAL LOW (ref 13.0–17.0)
MCH: 29.2 pg (ref 26.0–34.0)
MCHC: 31.7 g/dL (ref 30.0–36.0)
MCV: 92.1 fL (ref 80.0–100.0)
Platelets: 449 10*3/uL — ABNORMAL HIGH (ref 150–400)
RBC: 4.32 MIL/uL (ref 4.22–5.81)
RDW: 13.1 % (ref 11.5–15.5)
WBC: 8.2 10*3/uL (ref 4.0–10.5)
nRBC: 0 % (ref 0.0–0.2)

## 2019-08-01 LAB — MAGNESIUM: Magnesium: 1.9 mg/dL (ref 1.7–2.4)

## 2019-08-01 MED ORDER — FAMOTIDINE 20 MG PO TABS
10.0000 mg | ORAL_TABLET | Freq: Every day | ORAL | Status: DC
  Filled 2019-08-01: qty 1

## 2019-08-01 NOTE — Discharge Summary (Signed)
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 Earlie Server and was admitted on 2/5 for intractable nausea and vomiting related to his recent chemotherapy.  CXR in ED with stable right-sided pleural effusion and multiple right-sided pulmonary masses otherwise no acute processes.   ED labs:Na 136, K 4.5, Bun 25, Creatinine 0.98 Ast 22, Alt 35 Trop 3 Wbc 12.4, Hgb 11.2, Plt 484  Also noted to have severe right-sided back pain consistent with his chronic cancer related pain.  Patient was given Compazine for nausea and morphine for pain and remained on clear liquid diet.  He tolerated advancement of diet to regular prior to discharge.  He was discharged in stable condition on home meds  A & P   Principal Problem:   Nausea & vomiting   1. Intractable nausea/vomiting secondary to starting chemotherapy 1. Resolved with IV Pepcid, Compazine tolerated advancement of diet 2. Discharged with Compazine 2. Right back pain secondary to malignant mesothelioma with multiple large hypermetabolic right pleural-based masses with chest wall invasion and associated rib erosions as well as anterior mediastinal mass and right internal mammary lymphadenopathy 1. Discharged with MS Contin 60 mg twice daily per oncology team 2. Started  chemo 07/29/2019 3. Follow-up with oncology 3. Reactive leukocytosis 1. resolved    Consultants  . Called oncology and left a message  Procedures  . None  Antibiotics   Anti-infectives (From admission, onward)   None        Subjective    On my arrival patient states he is "ready to go."  States that his nausea has significantly improved and his pain is well controlled with morphine.  Upon advancement of diet he was able to tolerate general diet.  Otherwise no acute complaints  Objective   Discharge Exam: Vitals:   07/31/19 2047 08/01/19 0605  BP: (!) 149/92 (!) 141/84  Pulse: 73 80  Resp: 12 18  Temp: 99 F (37.2 C) 99.3 F (37.4 C)  SpO2: 96% 95%   Vitals:   07/31/19 1029 07/31/19 2047 08/01/19 0500 08/01/19 0605  BP: (!) 155/83 (!) 149/92  (!) 141/84  Pulse: 61 73  80  Resp: 16 12  18   Temp: 98.8 F (37.1 C) 99 F (37.2 C)  99.3 F (37.4 C)  TempSrc: Oral Oral  Oral  SpO2: 100% 96%  95%  Weight: 71 kg  69 kg   Height: 5\' 8"  (1.727 m)       Physical Exam Vitals and nursing note reviewed.  Constitutional:      Appearance: Normal appearance.  HENT:     Head: Normocephalic and atraumatic.  Eyes:     Conjunctiva/sclera: Conjunctivae normal.  Cardiovascular:     Rate and Rhythm: Normal rate and regular rhythm.  Pulmonary:     Effort: Pulmonary effort is normal.     Breath sounds: Normal breath sounds.  Abdominal:     General: Abdomen is flat.  Palpations: Abdomen is soft.  Musculoskeletal:        General: No swelling or tenderness.  Neurological:     General: No focal deficit present.     Mental Status: He is alert. Mental status is at baseline.  Psychiatric:        Mood and Affect: Mood normal.        Behavior: Behavior normal.       The results of significant diagnostics from this hospitalization (including imaging, microbiology, ancillary and laboratory) are listed below for reference.     Microbiology: Recent Results (from the  past 240 hour(s))  SARS CORONAVIRUS 2 (TAT 6-24 HRS) Nasopharyngeal Nasopharyngeal Swab     Status: None   Collection Time: 07/31/19 12:54 AM   Specimen: Nasopharyngeal Swab  Result Value Ref Range Status   SARS Coronavirus 2 NEGATIVE NEGATIVE Final    Comment: (NOTE) SARS-CoV-2 target nucleic acids are NOT DETECTED. The SARS-CoV-2 RNA is generally detectable in upper and lower respiratory specimens during the acute phase of infection. Negative results do not preclude SARS-CoV-2 infection, do not rule out co-infections with other pathogens, and should not be used as the sole basis for treatment or other patient management decisions. Negative results must be combined with clinical observations, patient history, and epidemiological information. The expected result is Negative. Fact Sheet for Patients: SugarRoll.be Fact Sheet for Healthcare Providers: https://www.woods-mathews.com/ This test is not yet approved or cleared by the [place] FDA and  has been authorized for detection and/or diagnosis of SARS-CoV-2 by FDA under an Emergency Use Authorization (EUA). This EUA will remain  in effect (meaning this test can be used) for the duration of the COVID-19 declaration under Section 56 4(b)(1) of the Act, 21 U.S.C. section 360bbb-3(b)(1), unless the authorization is terminated or revoked sooner. Performed at [facility], [place] [address], [place], [place] [address]      Labs: BNP (last 3 results) No results for input(s): BNP in the last 8760 hours. Basic Metabolic Panel: Recent Labs  Lab 07/29/19 0731 07/30/19 2208 07/31/19 0500 08/01/19 0259  NA 140 136 136 134*  K 4.1 4.5 4.5 4.3  CL 102 101 102 100  CO2 26 26 27 25   GLUCOSE 183* 138* 114* 93  BUN 18 25* 21 18  CREATININE 0.88 0.98 0.96 0.99  CALCIUM 9.5 9.1 8.6* 8.7*  MG 2.2  --   --  1.9   Liver Function Tests: Recent Labs  Lab 07/29/19 0731 07/30/19 2208  07/31/19 0500  AST 22 22 22   ALT 34 35 28  ALKPHOS 93 73 63  BILITOT 0.3 0.6 1.0  PROT 7.2 6.8 5.8*  ALBUMIN 2.9* 2.9* 2.5*   Recent Labs  Lab 07/30/19 2208  LIPASE 18   No results for input(s): AMMONIA in the last 168 hours. CBC: Recent Labs  Lab 07/29/19 0731 07/30/19 2208 07/31/19 0500 08/01/19 0259  WBC 11.5* 12.4* 9.9 8.2  NEUTROABS 10.4* 11.4*  --   --   HGB 11.7* 11.2* 10.4* 12.6*  HCT 36.7* 35.7* 32.8* 39.8  MCV 90.8 92.0 93.2 92.1  PLT 492* 484* 430* 449*   Cardiac Enzymes: No results for input(s): CKTOTAL, CKMB, CKMBINDEX, TROPONINI in the last 168 hours. BNP: Invalid input(s): POCBNP CBG: No results for input(s): GLUCAP in the last 168 hours. D-Dimer No results for input(s): DDIMER in the last 72 hours. Hgb A1c No results for input(s): HGBA1C in the last 72 hours. Lipid Profile No results for input(s): CHOL, HDL, LDLCALC,  TRIG, CHOLHDL, LDLDIRECT in the last 72 hours. Thyroid function studies No results for input(s): TSH, T4TOTAL, T3FREE, THYROIDAB in the last 72 hours.  Invalid input(s): FREET3 Anemia work up No results for input(s): VITAMINB12, FOLATE, FERRITIN, TIBC, IRON, RETICCTPCT in the last 72 hours. Urinalysis    Component Value Date/Time   COLORURINE YELLOW 12/10/2015 1150   APPEARANCEUR CLOUDY (A) 12/10/2015 1150   LABSPEC 1.026 12/10/2015 1150   PHURINE 5.5 12/10/2015 1150   GLUCOSEU NEGATIVE 12/10/2015 1150   HGBUR LARGE (A) 12/10/2015 1150   BILIRUBINUR NEGATIVE 12/10/2015 1150   KETONESUR 15 (A) 12/10/2015 1150   PROTEINUR NEGATIVE 07/29/2019 0745   NITRITE NEGATIVE 12/10/2015 1150   LEUKOCYTESUR NEGATIVE 12/10/2015 1150   Sepsis Labs Invalid input(s): PROCALCITONIN,  WBC,  LACTICIDVEN Microbiology Recent Results (from the past 240 hour(s))  SARS CORONAVIRUS 2 (TAT 6-24 HRS) Nasopharyngeal Nasopharyngeal Swab     Status: None   Collection Time: 07/31/19 12:54 AM   Specimen: Nasopharyngeal Swab  Result Value Ref Range  Status   SARS Coronavirus 2 NEGATIVE NEGATIVE Final    Comment: (NOTE) SARS-CoV-2 target nucleic acids are NOT DETECTED. The SARS-CoV-2 RNA is generally detectable in upper and lower respiratory specimens during the acute phase of infection. Negative results do not preclude SARS-CoV-2 infection, do not rule out co-infections with other pathogens, and should not be used as the sole basis for treatment or other patient management decisions. Negative results must be combined with clinical observations, patient history, and epidemiological information. The expected result is Negative. Fact Sheet for Patients: SugarRoll.be Fact Sheet for Healthcare Providers: https://www.woods-mathews.com/ This test is not yet approved or cleared by the [place] FDA and  has been authorized for detection and/or diagnosis of SARS-CoV-2 by FDA under an Emergency Use Authorization (EUA). This EUA will remain  in effect (meaning this test can be used) for the duration of the COVID-19 declaration under Section 56 4(b)(1) of the Act, 21 U.S.C. section 360bbb-3(b)(1), unless the authorization is terminated or revoked sooner. Performed at [facility], [place] [address] [place], [place] [address]     Discharge Instructions     Discharge Instructions    Diet - low sodium heart healthy   Complete by: As directed    Discharge instructions   Complete by: As directed    You were seen and examined in the hospital for nausea and vomiting likely from your chemo regimen and cared for by a hospitalist.   Upon Discharge:  - Increase your diet slowly as tolerated - Your morphine was adjusted by your oncology team, take MS Contin 60 mg tablet every 12 hours - Take compazine every 6 hours as needed for nausea - Call your oncologist this week to discuss your chemo regimen and further treatment   Please get all hospital records sent to your physician by signing a  hospital release before you go home.     Read the complete instructions along with all the possible side effects for all the medicines you take and that have been prescribed to you. Take any new medicines after you have completely understood and accept all the possible adverse reactions/side effects.   If you have any questions about your discharge medications or the care you received while you were in the hospital, you can call the unit and asked to speak with the hospitalist on call. Once you are discharged, your primary care physician will handle any further medical issues. Please note that NO REFILLS for any discharge  medications will be authorized, as it is imperative that you return to your primary care physician (or establish a relationship with a primary care physician if you do not have one) for your aftercare needs so that they can reassess your need for medications and monitor your lab values.   Do not drive, operate heavy machinery, perform activities at heights, swimming or participation in water activities or provide baby sitting services if your were admitted for loss of consciousness/seizures or if you are on sedating medications including, but not limited to benzodiazepines, sleep medications, narcotic pain medications, etc., until you have been cleared to do so by a medical doctor.   Do not take more than prescribed medications.   Wear a seat belt while driving.  If you have smoked or chewed Tobacco in the last 2 years please stop smoking; also stop any regular Alcohol and/or any Recreational drug use including marijuana.  If you experience worsening of your admission symptoms or develop shortness of breath, chest pain, suicidal or homicidal thoughts or experience a life threatening emergency, you must seek medical attention immediately by calling 911 or calling your PCP immediately.   Increase activity slowly   Complete by: As directed      Allergies as of 08/01/2019   No  Known Allergies     Medication List    STOP taking these medications   magnesium citrate Soln     TAKE these medications   dexamethasone 4 MG tablet Commonly known as: DECADRON 1 tablet p.o. twice daily the day before, day of and day after chemotherapy every 3 weeks.   folic acid 1 MG tablet Commonly known as: FOLVITE Take 1 tablet (1 mg total) by mouth daily.   morphine 60 MG 12 hr tablet Commonly known as: MS CONTIN Take 1 tablet (60 mg total) by mouth every 12 (twelve) hours.   OVER THE COUNTER MEDICATION Take 1 tablet by mouth 2 (two) times daily. Prostate health supplement   oxyCODONE 5 MG immediate release tablet Commonly known as: Oxy IR/ROXICODONE Take 1-2 tablets (5-10 mg total) by mouth every 4 (four) hours as needed for severe pain.   prochlorperazine 10 MG tablet Commonly known as: COMPAZINE Take 1 tablet (10 mg total) by mouth every 6 (six) hours as needed for nausea or vomiting.       No Known Allergies  Time coordinating discharge: Over 30 minutes   SIGNED:   Harold Hedge, D.O. [facility] Pager: [phone]  08/01/2019, 2:31 PM

## 2019-08-01 NOTE — Progress Notes (Signed)
Went over discharge papers with patient and his wife.  All questions answered.  VSS.  PT wheeled out via Therapist, sports.

## 2019-08-03 ENCOUNTER — Ambulatory Visit: Payer: Medicare Other

## 2019-08-03 ENCOUNTER — Ambulatory Visit
Admission: RE | Admit: 2019-08-03 | Discharge: 2019-08-03 | Disposition: A | Discharge status: Home / Self Care | Payer: Medicare Other | Source: Ambulatory Visit | Attending: Radiation Oncology | Admitting: Radiation Oncology

## 2019-08-03 ENCOUNTER — Telehealth: Payer: Self-pay | Admitting: *Deleted

## 2019-08-03 NOTE — Progress Notes (Deleted)
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  PRIOR THERAPY: None  CURRENT THERAPY: 1) Systemic chemotherapy with cisplatin 75 mg/M2, Alimta 500 mg/M2 and Avastin 15 mg/KG every 3 weeks.  First dose July 28, 2019. Status post 1 cycle 2) Palliative radiotherapy to the right chest wall under the care of Dr. Lisbeth Renshaw.   INTERVAL HISTORY: Mario Harmon 74 y.o. male returns to the clinic for a follow up visit. The Harmon was recently diagnosed with malignant pleural mesothelioma. He is currently undergoing palliative radiotherapy to the painful right chest wall do to multiple large right pleural-based masses with chest wall invasion, rib erosions.His pain medication regimen includes ___.   The Harmon received his first cycle on 07/28/2019. The Harmon went to the emergency room following his first cycle for intractable nausea and vomiting. He was admitted to the hospital from 2/4-2/6. His nausea and vomiting resolved with IV pepcid, compazine  Today, the Harmon is feeling __. He denies any recent fevers, chills, night sweats, or weight loss. Denies any more nausea/vomiting, diarrhea, or constipation. He denies (breathing?). He denies any headaches or visual changes. He is here for evaluation and for a one week follow up visit.     MEDICAL HISTORY: Past Medical History:  Diagnosis Date  . Cancer ([place])     ALLERGIES:  has No Known Allergies.  MEDICATIONS:  Current Outpatient Medications  Medication Sig Dispense Refill  . dexamethasone (DECADRON) 4 MG tablet 1 tablet p.o. twice daily the day before, day of and day after chemotherapy every 3 weeks.  40 tablet 1  . folic acid (FOLVITE) 1 MG tablet Take 1 tablet (1 mg total) by mouth daily. 30 tablet 4  . morphine (MS CONTIN) 60 MG 12 hr tablet Take 1 tablet (60 mg total) by mouth every 12 (twelve) hours. 60 tablet 0  . OVER THE COUNTER MEDICATION Take 1 tablet by mouth 2 (two) times daily. Prostate health supplement    . oxyCODONE (OXY IR/ROXICODONE) 5 MG immediate release tablet Take 1-2 tablets (5-10 mg total) by mouth every 4 (four) hours as needed for severe pain. 120 tablet 0  . prochlorperazine (COMPAZINE) 10 MG tablet Take 1 tablet (10 mg total) by mouth every 6 (six) hours as needed for nausea or vomiting. 30 tablet 0   No current facility-administered medications for this visit.    SURGICAL HISTORY: No past surgical history on file.  REVIEW OF SYSTEMS:   Review of Systems  Constitutional: Negative for appetite change, chills, fatigue, fever and unexpected weight change.  HENT:   Negative for mouth sores, nosebleeds, sore throat and trouble swallowing.   Eyes: Negative for eye problems and icterus.  Respiratory: Negative for cough, hemoptysis, shortness of breath and wheezing.   Cardiovascular: Negative for chest pain and leg swelling.  Gastrointestinal: Negative for abdominal pain, constipation, diarrhea, nausea and vomiting.  Genitourinary: Negative for bladder incontinence, difficulty urinating, dysuria, frequency and hematuria.   Musculoskeletal: Negative for back pain, gait problem, neck pain and neck stiffness.  Skin: Negative for itching and rash.  Neurological: Negative for dizziness, extremity weakness, gait problem, headaches, light-headedness and seizures.  Hematological: Negative for adenopathy. Does not bruise/bleed easily.  Psychiatric/Behavioral: Negative for confusion,  depression and sleep disturbance. The Harmon is not nervous/anxious.     PHYSICAL EXAMINATION:  There were no vitals taken for this visit.  ECOG PERFORMANCE STATUS: {CHL ONC ECOG  [id number]  Physical Exam  Constitutional: Oriented to person, place, and time and well-developed, well-nourished, and in no distress. No distress.  HENT:  Head: Normocephalic and atraumatic.  Mouth/Throat: Oropharynx is clear and moist. No oropharyngeal exudate.  Eyes: Conjunctivae are normal. Right eye exhibits no discharge. Left eye exhibits no discharge. No scleral icterus.  Neck: Normal range of motion. Neck supple.  Cardiovascular: Normal rate, regular rhythm, normal heart sounds and intact distal pulses.   Pulmonary/Chest: Effort normal and breath sounds normal. No respiratory distress. No wheezes. No rales.  Abdominal: Soft. Bowel sounds are normal. Exhibits no distension and no mass. There is no tenderness.  Musculoskeletal: Normal range of motion. Exhibits no edema.  Lymphadenopathy:    No cervical adenopathy.  Neurological: Alert and oriented to person, place, and time. Exhibits normal muscle tone. Gait normal. Coordination normal.  Skin: Skin is warm and dry. No rash noted. Not diaphoretic. No erythema. No pallor.  Psychiatric: Mood, memory and judgment normal.  Vitals reviewed.  LABORATORY DATA: Lab Results  Component Value Date   WBC 8.2 08/01/2019   HGB 12.6 (L) 08/01/2019   HCT 39.8 08/01/2019   MCV 92.1 08/01/2019   PLT 449 (H) 08/01/2019      Chemistry      Component Value Date/Time   NA 134 (L) 08/01/2019 0259   K 4.3 08/01/2019 0259   CL 100 08/01/2019 0259   CO2 25 08/01/2019 0259   BUN 18 08/01/2019 0259   CREATININE 0.99 08/01/2019 0259   CREATININE 0.88 07/29/2019 0731      Component Value Date/Time   CALCIUM 8.7 (L) 08/01/2019 0259   ALKPHOS 63 07/31/2019 0500   AST 22 07/31/2019 0500   AST 22 07/29/2019 0731   ALT 28 07/31/2019 0500   ALT 34 07/29/2019 0731   BILITOT 1.0 07/31/2019 0500   BILITOT 0.3 07/29/2019 0731       RADIOGRAPHIC STUDIES:  DG Chest 1 View  Result Date: 07/31/2019 CLINICAL DATA:  Vomiting.  Lung cancer. EXAM:  CHEST  1 VIEW COMPARISON:  July 03, 2019 FINDINGS: A right-sided pleural effusion in multiple pleural masses are again noted. The appearance is not significantly changed from prior study. There is no pneumothorax. The heart size is normal. There may be some mild vascular congestion. There is no definite focal infiltrate. There is no acute osseous abnormality. IMPRESSION: 1. No acute cardiopulmonary process. 2. Relatively stable appearance of a right-sided pleural effusion and multiple right-sided pulmonary masses. Electronically Signed   By: Constance Holster M.D.   On: 07/31/2019 01:39   MR BRAIN W WO CONTRAST  Result Date: 07/08/2019 CLINICAL DATA:  Non-small cell lung cancer. Staging. Gait disturbance. EXAM: MRI HEAD WITHOUT AND WITH CONTRAST TECHNIQUE: Multiplanar, multiecho pulse sequences of the brain and surrounding structures were obtained without and with intravenous contrast. CONTRAST:  8mL GADAVIST GADOBUTROL 1 MMOL/ML IV SOLN COMPARISON:  02/21/2010 FINDINGS: Brain: Ordinary age related volume loss. Minimal chronic small-vessel ischemic change of the hemispheric white matter. No evidence of recent infarction. No cortical or large vessel territory infarction. No evidence of primary or metastatic mass lesion, recent hemorrhage, hydrocephalus or extra-axial collection. Low level dural enhancement is nonspecific and can be persistent from the previous head injury of 20/11. Unlikely to be malignant. Vascular: Major vessels at the base of  the brain show flow. Skull and upper cervical spine: Negative Sinuses/Orbits: Clear/normal Other: None IMPRESSION: No evidence of metastatic disease. Mild age related volume loss and small-vessel change of the white matter. Diffuse dural enhancement. This is most likely persistent from the previous head injury in 2011. Malignant etiology would be unlikely. Electronically Signed   By: Nelson Chimes M.D.   On: 07/08/2019 15:05   NM PET Image Initial (PI) Skull Base To  Thigh  Result Date: 07/08/2019 CLINICAL DATA:  Initial treatment strategy for solitary pulmonary nodule. EXAM: NUCLEAR MEDICINE PET SKULL BASE TO THIGH TECHNIQUE: 7.4 mCi F-18 FDG was injected intravenously. Full-ring PET imaging was performed from the skull base to thigh after the radiotracer. CT data was obtained and used for attenuation correction and anatomic localization. Fasting blood glucose: 65 mg/dl COMPARISON:  Chest radiograph 07/03/2019 CT chest 06/27/2019 FINDINGS: Mediastinal blood pool activity: SUV max 2.2 Liver activity: SUV max NA NECK: Mildly asymmetric activity along the right glottis and ariepiglottic fold, maximum SUV 4.2, no CT correlate, probably physiologic/incidental. Incidental CT findings: none CHEST: Multiple large right pleural masses, with least 2 sites of invasion of the right chest wall. A mass along the anterior mediastinal border of the pleura measures 3.1 by 4.8 cm on image 78/4 with maximum SUV 17.1. A hypermetabolic mass extending through the third intercostal space and eroding the right fourth rib measures approximately 2.8 cm in thickness with maximum SUV 22.3. There is also a mass invading through the fifth intercostal space with erosion of the right sixth rib. Small right pleural effusion. There is a mass along the right lateral hemidiaphragm inseparable from the diaphragm and indenting the margin of the liver, this mass has a maximum SUV of 26.1. The previously described anterior mediastinal mass and right internal mammary lymph node may represent extension of pleural disease given that both abut the pleura. There is subtle accentuated activity anteriorly the left fourth rib along the costochondral junction, maximum SUV 3.2, without a definite adjacent pleural mass, an without a definite well-defined fracture. Incidental CT findings: none ABDOMEN/PELVIS: No significant abnormal hypermetabolic activity in this region. Incidental CT findings: None SKELETON: Small focus of  accentuated activity along the inferior margin of the right T11 vertebra, maximum SUV 3.9, no CT correlate, probably incidental but merits surveillance. Right rib erosions due to pleural masses as noted above. Incidental CT findings: none IMPRESSION: 1. Multiple large hypermetabolic right pleural masses with at least 2 sites of chest wall invasion with associated rib erosions, compatible with either primary pleural malignancy, peripheral lung cancer with early spread to the pleura, or lymphoproliferative disease with pleural involvement. Anterior mediastinal mass and right internal mammary lymph node may represent local invasion from the pleural disease. Tissue diagnosis recommended. 2. Subtle activity anteriorly in the left fourth rib along the costochondral junction. This low-grade activity could be from a subtle nondisplaced occult fracture. There is no underlying CT abnormality or pleural lesion in this vicinity. Regional surveillance is recommended. 3. Mildly asymmetric activity in the right glottis without appreciable CT abnormality, probably incidental and physiologic. Electronically Signed   By: Van Clines M.D.   On: 07/08/2019 11:59   CT Biopsy  Result Date: 07/14/2019 CLINICAL DATA:  Pleural masses, hypermetabolic on PET-CT, with body wall and rib invasion. EXAM: CT GUIDED CORE BIOPSY OF PLEURAL MASS ANESTHESIA/SEDATION: Intravenous Fentanyl 160mcg and Versed 2.5mg  were administered as conscious sedation during continuous monitoring of the Harmon's level of consciousness and physiological / cardiorespiratory status by the radiology RN,  with a total moderate sedation time of 20 minutes. PROCEDURE: The procedure risks, benefits, and alternatives were explained to the Harmon. Questions regarding the procedure were encouraged and answered. The Harmon understands and consents to the procedure. Select axial scans through the thorax were obtained. A representative right pleural mass with body wall  invasion was localized and an appropriate skin entry site was determined and marked. The operative field was prepped with chlorhexidinein a sterile fashion, and a sterile drape was applied covering the operative field. A sterile gown and sterile gloves were used for the procedure. Local anesthesia was provided with 1% Lidocaine. Under CT fluoroscopic guidance, a 17 gauge trocar needle was advanced to the margin of the lesion. The aerated lung was not traversed. Once needle tip position was confirmed, coaxial 18-gauge core biopsy samples were obtained, submitted in saline to surgical pathology. The guide needle was removed. Postprocedure scans show no hemorrhage, pneumothorax, or other apparent complication. COMPLICATIONS: None immediate FINDINGS: Lobular right pleural masses are localized. Representative core biopsy samples obtained as above. IMPRESSION: 1. Technically successful CT-guided core biopsy, right pleural mass. Electronically Signed   By: Lucrezia Europe M.D.   On: 07/14/2019 16:16   DG Abd 2 Views  Result Date: 07/31/2019 CLINICAL DATA:  Pain EXAM: ABDOMEN - 2 VIEW COMPARISON:  January 03, 2016 FINDINGS: There is a large amount of stool throughout the visualized colon. The bowel gas pattern is nonobstructive. There are degenerative changes of the spine and bilateral hips. IMPRESSION: Large stool burden.  Nonobstructive bowel gas pattern. Electronically Signed   By: Constance Holster M.D.   On: 07/31/2019 01:40     ASSESSMENT/PLAN:  This is a very pleasant 74 year old Caucasian male with recently diagnosed malignant pleural mesothelioma involving the right chest with evidence of metastatic disease to the mediastinal lymph nodes and large pleural-based masses as well as right pleural effusion. He was diagnosed in January 2021.   The Harmon is currently undergoing palliative radiotherapy to the painful right chest wall.   He is undergoing palliative systemic chemotherapy with cisplatin 75 mg/M2,  Alimta 500 mg/M2 and Avastin 15 mg/KG every 3 weeks. He is status post 1 cycle and he experienced intractable nausea/vomiting after his first cycle resulting in a hospital admission.   The Harmon was seen with Dr. Julien Nordmann today. Dr. Julien Nordmann recommends modifying his nausea/vomiting regimen with __.   Labs were reviewed. We will see him back for a follow up visit in 2 weeks for evaluation before starting cycle #2.   He will continue with his current pain medication regimen with __.   The Harmon was advised to call immediately if she has any concerning symptoms in the interval. The Harmon voices understanding of current disease status and treatment options and is in agreement with the current care plan. All questions were answered. The Harmon knows to call the clinic with any problems, questions or concerns. We can certainly see the Harmon much sooner if necessary       No orders of the defined types were placed in this encounter.    Kasiya Burck L Cade Dashner, PA-C 08/03/19

## 2019-08-03 NOTE — Telephone Encounter (Signed)
Called the patient to check in to see how he was doing since discharge from the hospital.  He states he still has not recovered from his last chemotherapy treatment.  He states his pain is about a 3/10 since his medication regimen was changed last week.  He hopes to return to his radiation treatments soon.  Will continue to follow as necessary.  Gloriajean Dell. Leonie Green, BSN

## 2019-08-04 ENCOUNTER — Ambulatory Visit: Payer: Medicare Other

## 2019-08-04 ENCOUNTER — Other Ambulatory Visit: Payer: Self-pay | Admitting: Physician Assistant

## 2019-08-04 ENCOUNTER — Inpatient Hospital Stay: Payer: Medicare Other | Admitting: Physician Assistant

## 2019-08-04 ENCOUNTER — Telehealth: Payer: Self-pay | Admitting: *Deleted

## 2019-08-04 ENCOUNTER — Ambulatory Visit
Admission: RE | Admit: 2019-08-04 | Discharge: 2019-08-04 | Disposition: A | Discharge status: Home / Self Care | Payer: Medicare Other | Source: Ambulatory Visit | Attending: Radiation Oncology | Admitting: Radiation Oncology

## 2019-08-04 DIAGNOSIS — R112 Nausea with vomiting, unspecified: Secondary | ICD-10-CM

## 2019-08-04 MED ORDER — ONDANSETRON HCL 8 MG PO TABS: 8.0000 mg | ORAL_TABLET | Freq: Three times a day (TID) | ORAL | 2 refills | Status: DC | PRN

## 2019-08-04 NOTE — Telephone Encounter (Signed)
Received call from pt stating that he needs to cancel his 10:30 am appt today b/c he is still sick from chemo/radiation.  He is also cancelling his 11 am radiation appt.  He wants to know if Dr Julien Nordmann suggest anything else for nausea.  He is taking compazine q 6 hrs but states that he has spontaneous vomiting.  He reports no dizziness or violent vomiting but just happens when he gets up.  He is laying down a lot.  His appetite is poor.  Discussed fluids & he reports drinking some water, gingerale, & OJ.  Encouraged to cont to drink fluids as much as possible & maybe back off OJ since acidic.  Offered to come in for IVF but he said he was in the hosp over the w/e & wasn't doing that again.  Informed that we could do in office but he doesn't feel that he can come in.  Informed Dr Julien Nordmann & he suggested zofran 8 mg every 8 hours & Cassie will send in. Informed Mr Marcin. Will send schedule message to r/s.

## 2019-08-05 ENCOUNTER — Ambulatory Visit: Payer: Medicare Other

## 2019-08-05 ENCOUNTER — Ambulatory Visit
Admission: RE | Admit: 2019-08-05 | Discharge: 2019-08-05 | Disposition: A | Discharge status: Home / Self Care | Payer: Medicare Other | Source: Ambulatory Visit | Attending: Radiation Oncology | Admitting: Radiation Oncology

## 2019-08-06 ENCOUNTER — Telehealth: Payer: Self-pay | Admitting: *Deleted

## 2019-08-06 ENCOUNTER — Ambulatory Visit: Payer: Medicare Other

## 2019-08-06 ENCOUNTER — Ambulatory Visit: Payer: Medicare Other | Admitting: Internal Medicine

## 2019-08-06 NOTE — Telephone Encounter (Signed)
Received vm call from pt stating that he is cancelling his appt with Dr Earlie Server & radiation due to spontaneous vomiting.  He reports that is better than yest.  Informed Dr Julien Nordmann & he suggested pt r/s with Cassie.  Message to schedulers.

## 2019-08-06 NOTE — Telephone Encounter (Signed)
Spoke with the patient to check in to see how he was feeling.  He states he feels a little better, pain is better controlled, nausea at bay.  He states he is having multiple episodes of vomiting without warning throughout the day.  He is taking his zofran q8 and states he does not know if it is helping or not.  He stated he would try to make it for his radiation treatment and appointment with medical oncology tomorrow but if he continues to have these vomiting episodes he would call and cancel.  I advised him to let medical oncology know of his continued symptoms without relief.  He verbalized understanding of all recommendations.  Will continue to follow as necessary.  Gloriajean Dell. Leonie Green, BSN

## 2019-08-07 ENCOUNTER — Telehealth: Payer: Self-pay | Admitting: Medical Oncology

## 2019-08-07 ENCOUNTER — Telehealth: Payer: Self-pay | Admitting: Internal Medicine

## 2019-08-07 ENCOUNTER — Ambulatory Visit: Payer: Medicare Other

## 2019-08-07 ENCOUNTER — Other Ambulatory Visit: Payer: Self-pay

## 2019-08-07 DIAGNOSIS — C801 Malignant (primary) neoplasm, unspecified: Secondary | ICD-10-CM | POA: Diagnosis not present

## 2019-08-07 DIAGNOSIS — Z510 Encounter for antineoplastic radiation therapy: Secondary | ICD-10-CM | POA: Diagnosis present

## 2019-08-07 DIAGNOSIS — C782 Secondary malignant neoplasm of pleura: Secondary | ICD-10-CM | POA: Diagnosis present

## 2019-08-07 DIAGNOSIS — C450 Mesothelioma of pleura: Secondary | ICD-10-CM | POA: Diagnosis not present

## 2019-08-07 NOTE — Telephone Encounter (Signed)
appts confirmed for next week.

## 2019-08-07 NOTE — Telephone Encounter (Signed)
R/s appt per 2/11 sch message - unable to reach pt .left message with apt date and time

## 2019-08-10 ENCOUNTER — Ambulatory Visit
Admission: RE | Admit: 2019-08-10 | Discharge: 2019-08-10 | Disposition: A | Discharge status: Home / Self Care | Payer: Medicare Other | Source: Ambulatory Visit | Attending: Radiation Oncology | Admitting: Radiation Oncology

## 2019-08-10 ENCOUNTER — Ambulatory Visit: Payer: Medicare Other

## 2019-08-10 DIAGNOSIS — C450 Mesothelioma of pleura: Secondary | ICD-10-CM | POA: Diagnosis not present

## 2019-08-11 ENCOUNTER — Encounter: Payer: Self-pay | Admitting: Internal Medicine

## 2019-08-11 ENCOUNTER — Other Ambulatory Visit: Payer: Self-pay

## 2019-08-11 ENCOUNTER — Ambulatory Visit
Admission: RE | Admit: 2019-08-11 | Discharge: 2019-08-11 | Disposition: A | Discharge status: Home / Self Care | Payer: Medicare Other | Source: Ambulatory Visit | Attending: Radiation Oncology | Admitting: Radiation Oncology

## 2019-08-11 ENCOUNTER — Inpatient Hospital Stay: Payer: Medicare Other

## 2019-08-11 ENCOUNTER — Ambulatory Visit: Payer: Medicare Other

## 2019-08-11 ENCOUNTER — Inpatient Hospital Stay (HOSPITAL_BASED_OUTPATIENT_CLINIC_OR_DEPARTMENT_OTHER): Payer: Medicare Other | Admitting: Internal Medicine

## 2019-08-11 ENCOUNTER — Other Ambulatory Visit: Payer: Medicare Other

## 2019-08-11 VITALS — BP 139/84 | HR 100 | Temp 98.1°F | Resp 18 | Ht 68.0 in | Wt 142.8 lb

## 2019-08-11 DIAGNOSIS — Z510 Encounter for antineoplastic radiation therapy: Secondary | ICD-10-CM | POA: Diagnosis not present

## 2019-08-11 DIAGNOSIS — R112 Nausea with vomiting, unspecified: Secondary | ICD-10-CM | POA: Diagnosis not present

## 2019-08-11 DIAGNOSIS — T451X5A Adverse effect of antineoplastic and immunosuppressive drugs, initial encounter: Secondary | ICD-10-CM | POA: Diagnosis not present

## 2019-08-11 DIAGNOSIS — C782 Secondary malignant neoplasm of pleura: Secondary | ICD-10-CM

## 2019-08-11 DIAGNOSIS — Z5111 Encounter for antineoplastic chemotherapy: Secondary | ICD-10-CM | POA: Diagnosis not present

## 2019-08-11 DIAGNOSIS — C7951 Secondary malignant neoplasm of bone: Secondary | ICD-10-CM

## 2019-08-11 DIAGNOSIS — C450 Mesothelioma of pleura: Secondary | ICD-10-CM

## 2019-08-11 DIAGNOSIS — Z7189 Other specified counseling: Secondary | ICD-10-CM

## 2019-08-11 DIAGNOSIS — K521 Toxic gastroenteritis and colitis: Secondary | ICD-10-CM | POA: Diagnosis not present

## 2019-08-11 DIAGNOSIS — G893 Neoplasm related pain (acute) (chronic): Secondary | ICD-10-CM

## 2019-08-11 DIAGNOSIS — C771 Secondary and unspecified malignant neoplasm of intrathoracic lymph nodes: Secondary | ICD-10-CM | POA: Diagnosis not present

## 2019-08-11 DIAGNOSIS — K5903 Drug induced constipation: Secondary | ICD-10-CM | POA: Diagnosis not present

## 2019-08-11 LAB — CBC WITH DIFFERENTIAL (CANCER CENTER ONLY)
Abs Immature Granulocytes: 0 10*3/uL (ref 0.00–0.07)
Basophils Absolute: 0 10*3/uL (ref 0.0–0.1)
Basophils Relative: 0 %
Eosinophils Absolute: 0.1 10*3/uL (ref 0.0–0.5)
Eosinophils Relative: 2 %
HCT: 32.6 % — ABNORMAL LOW (ref 39.0–52.0)
Hemoglobin: 10.4 g/dL — ABNORMAL LOW (ref 13.0–17.0)
Immature Granulocytes: 0 %
Lymphocytes Relative: 5 %
Lymphs Abs: 0.1 10*3/uL — ABNORMAL LOW (ref 0.7–4.0)
MCH: 29.1 pg (ref 26.0–34.0)
MCHC: 31.9 g/dL (ref 30.0–36.0)
MCV: 91.3 fL (ref 80.0–100.0)
Monocytes Absolute: 0.4 10*3/uL (ref 0.1–1.0)
Monocytes Relative: 16 %
Neutro Abs: 2 10*3/uL (ref 1.7–7.7)
Neutrophils Relative %: 77 %
Platelet Count: 159 10*3/uL (ref 150–400)
RBC: 3.57 MIL/uL — ABNORMAL LOW (ref 4.22–5.81)
RDW: 13 % (ref 11.5–15.5)
WBC Count: 2.6 10*3/uL — ABNORMAL LOW (ref 4.0–10.5)
nRBC: 0 % (ref 0.0–0.2)

## 2019-08-11 LAB — CMP (CANCER CENTER ONLY)
ALT: 18 U/L (ref 0–44)
AST: 19 U/L (ref 15–41)
Albumin: 2.9 g/dL — ABNORMAL LOW (ref 3.5–5.0)
Alkaline Phosphatase: 77 U/L (ref 38–126)
Anion gap: 9 (ref 5–15)
BUN: 12 mg/dL (ref 8–23)
CO2: 33 mmol/L — ABNORMAL HIGH (ref 22–32)
Calcium: 9.3 mg/dL (ref 8.9–10.3)
Chloride: 99 mmol/L (ref 98–111)
Creatinine: 1.09 mg/dL (ref 0.61–1.24)
GFR, Est AFR Am: >60 mL/min (ref >60)
GFR, Estimated: >60 mL/min (ref >60)
Glucose, Bld: 130 mg/dL — ABNORMAL HIGH (ref 70–99)
Potassium: 4.4 mmol/L (ref 3.5–5.1)
Sodium: 141 mmol/L (ref 135–145)
Total Bilirubin: 0.5 mg/dL (ref 0.3–1.2)
Total Protein: 6.8 g/dL (ref 6.5–8.1)

## 2019-08-11 LAB — MAGNESIUM: Magnesium: 2.2 mg/dL (ref 1.7–2.4)

## 2019-08-11 NOTE — Progress Notes (Signed)
Met with patient at registration to introduce myself as Arboriculturist and to offer available resources.  Discussed one-time $1000 Radio broadcast assistant to assist with personal expenses while going through treatment.  Gave him my card if interested in applying and for any additional financial questions or concerns.

## 2019-08-11 NOTE — Progress Notes (Signed)
DISCONTINUE ON PATHWAY REGIMEN - Mesothelioma   Bevacizumab 15 mg/kg IV + Cisplatin 75 mg/m2 IV + Pemetrexed 500 mg/m2 IV q21 Days:   A cycle is every 21 days:     Pemetrexed      Cisplatin      Bevacizumab-xxxx   **Always confirm dose/schedule in your pharmacy ordering system**  Bevacizumab 15 mg/kg q21d:   A cycle is every 21 days:     Bevacizumab-xxxx   **Always confirm dose/schedule in your pharmacy ordering system**  REASON: Toxicities / Adverse Event PRIOR TREATMENT: [id number]: Bevacizumab 15 mg/kg + Cisplatin 75 mg/m2 + Pemetrexed 500 mg/m2 q21 Days x 4-6 Cycles Followed by Bevacizumab 15 mg/kg Maintenance q21 Days Until Progression or Unacceptable Toxicity TREATMENT RESPONSE: Unable to Evaluate  START ON PATHWAY REGIMEN - Mesothelioma   Bevacizumab 15 mg/kg IV + Carboplatin AUC=5 IV + Pemetrexed 500 mg/m2 IV q21 Days:   A cycle is every 21 days:     Pemetrexed      Carboplatin      Bevacizumab-xxxx   **Always confirm dose/schedule in your pharmacy ordering system**  Bevacizumab 15 mg/kg q21d:   A cycle is every 21 days:     Bevacizumab-xxxx   **Always confirm dose/schedule in your pharmacy ordering system**  Patient Characteristics: Newly Diagnosed, Preoperative or Nonsurgical Candidate (Clinical Staging), Pleurectomy/Decortication or Other Surgical Procedure Not Planned, First Line Therapeutic Status: Newly Diagnosed, Preoperative or Nonsurgical Candidate (Clinical Staging) AJCC T Category: cT3 AJCC N Category: cN2 AJCC 8 Stage Grouping: IIIB AJCC M Category: cM0 Intent of Therapy: Non-Curative / Palliative Intent, Discussed with Patient

## 2019-08-11 NOTE — Progress Notes (Signed)
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  PRIOR THERAPY: None  CURRENT THERAPY: Systemic chemotherapy with cisplatin 75 mg/M2, Alimta 500 mg/M2 and Avastin 15 mg/KG every 3 weeks.  First dose July 29, 2019.  Status post 1 cycle.  INTERVAL HISTORY: Mario Harmon 74 y.o. male returns to the clinic today for follow-up visit.  The patient is feeling fine today with no concerning complaints except for the persistent nausea and vomiting after his chemotherapy.  He tried Zofran and Compazine with some improvement.  He also has constipation.  The patient denied having any current abdominal pain or diarrhea.  He continues to have mild right-sided chest pain but no significant shortness of breath, cough or hemoptysis.  He denied having any weight loss or night sweats.  He has no fever or chills.  He has no headache or visual changes.  He has a rough time with the first cycle of his chemotherapy.  He is here today for evaluation and discussion of other treatment options.   MEDICAL HISTORY: Past Medical History:  Diagnosis Date  . Cancer ([place])     ALLERGIES:  has No Known Allergies.  MEDICATIONS:  Current Outpatient Medications  Medication Sig Dispense Refill  . dexamethasone (DECADRON) 4 MG tablet 1 tablet p.o. twice daily the day before, day of and day after chemotherapy every 3 weeks. 40 tablet 1  . folic acid (FOLVITE) 1 MG tablet Take 1 tablet (1 mg total) by mouth daily. 30 tablet 4  . morphine (MS CONTIN) 60 MG 12 hr tablet Take 1 tablet (60 mg total) by mouth every 12 (twelve) hours. 60  tablet 0  . ondansetron (ZOFRAN) 8 MG tablet Take 1 tablet (8 mg total) by mouth every 8 (eight) hours as needed for nausea or vomiting. 30 tablet 2  . OVER THE COUNTER MEDICATION Take 1 tablet by mouth 2 (two) times daily. Prostate health supplement    . oxyCODONE (OXY IR/ROXICODONE) 5 MG immediate release tablet Take 1-2 tablets (5-10 mg total) by mouth every 4 (four) hours as needed for severe pain. 120 tablet 0  . prochlorperazine (COMPAZINE) 10 MG tablet Take 1 tablet (10 mg total) by mouth every 6 (six) hours as needed for nausea or vomiting. 30 tablet 0   No current facility-administered medications for this visit.    SURGICAL HISTORY: No past surgical history on file.  REVIEW OF SYSTEMS:  Constitutional: positive for fatigue Eyes: negative Ears, nose, mouth, throat, and face: negative Respiratory: positive for pleurisy/chest pain Cardiovascular: negative Gastrointestinal: positive for constipation, nausea and vomiting Genitourinary:negative Integument/breast: negative Hematologic/lymphatic: negative Musculoskeletal:negative Neurological: negative Behavioral/Psych: negative Endocrine: negative Allergic/Immunologic: negative   PHYSICAL EXAMINATION: General appearance: alert, cooperative, fatigued and no distress Head: Normocephalic, without obvious abnormality, atraumatic Neck: no adenopathy, no JVD, supple, symmetrical, trachea midline and thyroid not enlarged, symmetric, no tenderness/mass/nodules Lymph nodes: Cervical, supraclavicular, and axillary nodes normal. Resp: clear to auscultation bilaterally Back: symmetric, no curvature. ROM normal. No CVA tenderness. Cardio: regular rate and rhythm, S1, S2 normal, no murmur, click, rub or gallop GI: soft, non-tender; bowel sounds normal; no masses,  no organomegaly Extremities: extremities normal, atraumatic,  no cyanosis or edema Neurologic: Alert and oriented X 3, normal strength and tone. Normal symmetric reflexes. Normal  coordination and gait  ECOG PERFORMANCE STATUS: 1 - Symptomatic but completely ambulatory  Blood pressure 139/84, pulse 100, temperature 98.1 F (36.7 C), temperature source Temporal, resp. rate 18, height 5\' 8"  (1.727 m), weight 142 lb 12.8 oz (64.8 kg), SpO2 97 %.  LABORATORY DATA: Lab Results  Component Value Date   WBC 2.6 (L) 08/11/2019   HGB 10.4 (L) 08/11/2019   HCT 32.6 (L) 08/11/2019   MCV 91.3 08/11/2019   PLT 159 08/11/2019      Chemistry      Component Value Date/Time   NA 134 (L) 08/01/2019 0259   K 4.3 08/01/2019 0259   CL 100 08/01/2019 0259   CO2 25 08/01/2019 0259   BUN 18 08/01/2019 0259   CREATININE 0.99 08/01/2019 0259   CREATININE 0.88 07/29/2019 0731      Component Value Date/Time   CALCIUM 8.7 (L) 08/01/2019 0259   ALKPHOS 63 07/31/2019 0500   AST 22 07/31/2019 0500   AST 22 07/29/2019 0731   ALT 28 07/31/2019 0500   ALT 34 07/29/2019 0731   BILITOT 1.0 07/31/2019 0500   BILITOT 0.3 07/29/2019 0731       RADIOGRAPHIC STUDIES: DG Chest 1 View  Result Date: 07/31/2019 CLINICAL DATA:  Vomiting.  Lung cancer. EXAM: CHEST  1 VIEW COMPARISON:  July 03, 2019 FINDINGS: A right-sided pleural effusion in multiple pleural masses are again noted. The appearance is not significantly changed from prior study. There is no pneumothorax. The heart size is normal. There may be some mild vascular congestion. There is no definite focal infiltrate. There is no acute osseous abnormality. IMPRESSION: 1. No acute cardiopulmonary process. 2. Relatively stable appearance of a right-sided pleural effusion and multiple right-sided pulmonary masses. Electronically Signed   By: Constance Holster M.D.   On: 07/31/2019 01:39   CT Biopsy  Result Date: 07/14/2019 CLINICAL DATA:  Pleural masses, hypermetabolic on PET-CT, with body wall and rib invasion. EXAM: CT GUIDED CORE BIOPSY OF PLEURAL MASS ANESTHESIA/SEDATION: Intravenous Fentanyl 144mcg and Versed 2.5mg  were administered  as conscious sedation during continuous monitoring of the patient's level of consciousness and physiological / cardiorespiratory status by the radiology RN, with a total moderate sedation time of 20 minutes. PROCEDURE: The procedure risks, benefits, and alternatives were explained to the patient. Questions regarding the procedure were encouraged and answered. The patient understands and consents to the procedure. Select axial scans through the thorax were obtained. A representative right pleural mass with body wall invasion was localized and an appropriate skin entry site was determined and marked. The operative field was prepped with chlorhexidinein a sterile fashion, and a sterile drape was applied covering the operative field. A sterile gown and sterile gloves were used for the procedure. Local anesthesia was provided with 1% Lidocaine. Under CT fluoroscopic guidance, a 17 gauge trocar needle was advanced to the margin of the lesion. The aerated lung was not traversed. Once needle tip position was confirmed, coaxial 18-gauge core biopsy samples were obtained, submitted in saline to surgical pathology. The guide needle was removed. Postprocedure scans show no hemorrhage, pneumothorax, or other apparent complication. COMPLICATIONS: None immediate FINDINGS: Lobular right pleural masses are localized. Representative core biopsy samples obtained as above. IMPRESSION: 1. Technically successful CT-guided core biopsy, right pleural mass. Electronically Signed   By: Lucrezia Europe M.D.   On: 07/14/2019 16:16   DG Abd 2 Views  Result Date: 07/31/2019 CLINICAL DATA:  Pain EXAM: ABDOMEN - 2 VIEW COMPARISON:  January 03, 2016 FINDINGS: There is a large amount of stool throughout the visualized colon. The bowel gas pattern is nonobstructive. There are degenerative changes of the spine and bilateral hips. IMPRESSION: Large stool burden.  Nonobstructive bowel gas pattern. Electronically Signed   By: Constance Holster M.D.   On:  07/31/2019 01:40    ASSESSMENT AND PLAN: This is a very pleasant 74 years old white male with recently diagnosed malignant pleural mesothelioma involving the right chest with evidence of metastatic disease to the mediastinal lymph nodes and large pleural-based masses as well as right pleural effusion. The patient started systemic chemotherapy with cisplatin, Alimta and Avastin status post 1 cycle.  He has a rough time with the first cycle of his treatment with persistent nausea and vomiting as well as constipation.  He is not interested in continuing with the treatment with cisplatin anymore.  He is asking for other treatment options. I had a lengthy discussion with the patient today about his condition and change in treatment plan.  I recommended for the patient to discontinue his current treatment with cisplatin and replace it with carboplatin with the same regimen of Alimta and Avastin. He is in agreement to proceed with this treatment.  He will be treated with carboplatin for AUC of 5, Alimta 500 mg/M2 and Avastin 15 mg/KG every 3 weeks starting from cycle #2 on 08/19/2019. The patient will continue with his current premedications. For the nausea and vomiting, he will continue with his current treatment with Compazine and Zofran and I also recommended for the patient to extend his Decadron treatment for several more days. For constipation he will try magnesium citrate today and he was also advised to use MiraLAX on as-needed basis. For pain management, his pain is much better controlled with the current regimen of MS Contin as well as Percocet for breakthrough pain.  He also undergoing palliative radiotherapy to the painful lesion on the right side of the chest. The patient will come back for follow-up visit next week for evaluation before starting the first cycle of his new chemotherapy regimen. He was advised to call immediately if he has any concerning symptoms in the interval. The patient voices  understanding of current disease status and treatment options and is in agreement with the current care plan.  All questions were answered. The patient knows to call the clinic with any problems, questions or concerns. We can certainly see the patient much sooner if necessary.  Disclaimer: This note was dictated with voice recognition software. Similar sounding words can inadvertently be transcribed and may not be corrected upon review.

## 2019-08-11 NOTE — Patient Instructions (Signed)
Steps to Quit Smoking Smoking tobacco is the leading cause of preventable death. It can affect almost every organ in the body. Smoking puts you and people around you at risk for many serious, long-lasting (chronic) diseases. Quitting smoking can be hard, but it is one of the best things that you can do for your health. It is never too late to quit. How do I get ready to quit? When you decide to quit smoking, make a plan to help you succeed. Before you quit:  Pick a date to quit. Set a date within the next 2 weeks to give you time to prepare.  Write down the reasons why you are quitting. Keep this list in places where you will see it often.  Tell your family, friends, and co-workers that you are quitting. Their support is important.  Talk with your doctor about the choices that may help you quit.  Find out if your health insurance will pay for these treatments.  Know the people, places, things, and activities that make you want to smoke (triggers). Avoid them. What first steps can I take to quit smoking?  Throw away all cigarettes at home, at work, and in your car.  Throw away the things that you use when you smoke, such as ashtrays and lighters.  Clean your car. Make sure to empty the ashtray.  Clean your home, including curtains and carpets. What can I do to help me quit smoking? Talk with your doctor about taking medicines and seeing a counselor at the same time. You are more likely to succeed when you do both.  If you are pregnant or breastfeeding, talk with your doctor about counseling or other ways to quit smoking. Do not take medicine to help you quit smoking unless your doctor tells you to do so. To quit smoking: Quit right away  Quit smoking totally, instead of slowly cutting back on how much you smoke over a period of time.  Go to counseling. You are more likely to quit if you go to counseling sessions regularly. Take medicine You may take medicines to help you quit. Some  medicines need a prescription, and some you can buy over-the-counter. Some medicines may contain a drug called nicotine to replace the nicotine in cigarettes. Medicines may:  Help you to stop having the desire to smoke (cravings).  Help to stop the problems that come when you stop smoking (withdrawal symptoms). Your doctor may ask you to use:  Nicotine patches, gum, or lozenges.  Nicotine inhalers or sprays.  Non-nicotine medicine that is taken by mouth. Find resources Find resources and other ways to help you quit smoking and remain smoke-free after you quit. These resources are most helpful when you use them often. They include:  Online chats with a counselor.  Phone quitlines.  Printed self-help materials.  Support groups or group counseling.  Text messaging programs.  Mobile phone apps. Use apps on your mobile phone or tablet that can help you stick to your quit plan. There are many free apps for mobile phones and tablets as well as websites. Examples include Quit Guide from the CDC and smokefree.gov  What things can I do to make it easier to quit?   Talk to your family and friends. Ask them to support and encourage you.  Call a phone quitline (1-800-QUIT-NOW), reach out to support groups, or work with a counselor.  Ask people who smoke to not smoke around you.  Avoid places that make you want to smoke,   such as: ? Bars. ? Parties. ? Smoke-break areas at work.  Spend time with people who do not smoke.  Lower the stress in your life. Stress can make you want to smoke. Try these things to help your stress: ? Getting regular exercise. ? Doing deep-breathing exercises. ? Doing yoga. ? Meditating. ? Doing a body scan. To do this, close your eyes, focus on one area of your body at a time from head to toe. Notice which parts of your body are tense. Try to relax the muscles in those areas. How will I feel when I quit smoking? Day 1 to 3 weeks Within the first 24 hours,  you may start to have some problems that come from quitting tobacco. These problems are very bad 2-3 days after you quit, but they do not often last for more than 2-3 weeks. You may get these symptoms:  Mood swings.  Feeling restless, nervous, angry, or annoyed.  Trouble concentrating.  Dizziness.  Strong desire for high-sugar foods and nicotine.  Weight gain.  Trouble pooping (constipation).  Feeling like you may vomit (nausea).  Coughing or a sore throat.  Changes in how the medicines that you take for other issues work in your body.  Depression.  Trouble sleeping (insomnia). Week 3 and afterward After the first 2-3 weeks of quitting, you may start to notice more positive results, such as:  Better sense of smell and taste.  Less coughing and sore throat.  Slower heart rate.  Lower blood pressure.  Clearer skin.  Better breathing.  Fewer sick days. Quitting smoking can be hard. Do not give up if you fail the first time. Some people need to try a few times before they succeed. Do your best to stick to your quit plan, and talk with your doctor if you have any questions or concerns. Summary  Smoking tobacco is the leading cause of preventable death. Quitting smoking can be hard, but it is one of the best things that you can do for your health.  When you decide to quit smoking, make a plan to help you succeed.  Quit smoking right away, not slowly over a period of time.  When you start quitting, seek help from your doctor, family, or friends. This information is not intended to replace advice given to you by your health care provider. Make sure you discuss any questions you have with your health care provider. Document Revised: 03/06/2019 Document Reviewed: 08/30/2018 Elsevier Patient Education  2020 [employer] Inc.  

## 2019-08-12 ENCOUNTER — Ambulatory Visit
Admission: RE | Admit: 2019-08-12 | Discharge: 2019-08-12 | Disposition: A | Discharge status: Home / Self Care | Payer: Medicare Other | Source: Ambulatory Visit | Attending: Radiation Oncology | Admitting: Radiation Oncology

## 2019-08-12 ENCOUNTER — Other Ambulatory Visit: Payer: Self-pay

## 2019-08-12 ENCOUNTER — Ambulatory Visit: Payer: Medicare Other

## 2019-08-12 DIAGNOSIS — C450 Mesothelioma of pleura: Secondary | ICD-10-CM | POA: Diagnosis not present

## 2019-08-13 ENCOUNTER — Ambulatory Visit: Payer: Medicare Other

## 2019-08-14 ENCOUNTER — Telehealth: Payer: Self-pay | Admitting: Medical Oncology

## 2019-08-14 ENCOUNTER — Ambulatory Visit: Payer: Medicare Other

## 2019-08-14 ENCOUNTER — Other Ambulatory Visit: Payer: Self-pay | Admitting: Medical Oncology

## 2019-08-14 ENCOUNTER — Other Ambulatory Visit: Payer: Self-pay

## 2019-08-14 ENCOUNTER — Ambulatory Visit
Admission: RE | Admit: 2019-08-14 | Discharge: 2019-08-14 | Disposition: A | Discharge status: Home / Self Care | Payer: Medicare Other | Source: Ambulatory Visit | Attending: Radiation Oncology | Admitting: Radiation Oncology

## 2019-08-14 DIAGNOSIS — C450 Mesothelioma of pleura: Secondary | ICD-10-CM | POA: Diagnosis not present

## 2019-08-14 DIAGNOSIS — R112 Nausea with vomiting, unspecified: Secondary | ICD-10-CM

## 2019-08-14 MED ORDER — PROCHLORPERAZINE MALEATE 10 MG PO TABS: 10.0000 mg | ORAL_TABLET | Freq: Four times a day (QID) | ORAL | 0 refills | Status: DC | PRN

## 2019-08-14 NOTE — Telephone Encounter (Signed)
Compazine refill requested.

## 2019-08-17 ENCOUNTER — Other Ambulatory Visit: Payer: Self-pay

## 2019-08-17 ENCOUNTER — Ambulatory Visit
Admission: RE | Admit: 2019-08-17 | Discharge: 2019-08-17 | Disposition: A | Discharge status: Home / Self Care | Payer: Medicare Other | Source: Ambulatory Visit | Attending: Radiation Oncology | Admitting: Radiation Oncology

## 2019-08-17 ENCOUNTER — Telehealth: Payer: Self-pay | Admitting: Radiation Oncology

## 2019-08-17 ENCOUNTER — Encounter: Payer: Self-pay | Admitting: *Deleted

## 2019-08-17 ENCOUNTER — Telehealth: Payer: Self-pay

## 2019-08-17 DIAGNOSIS — C450 Mesothelioma of pleura: Secondary | ICD-10-CM | POA: Diagnosis not present

## 2019-08-17 MED ORDER — MORPHINE SULFATE ER 30 MG PO TBCR: EXTENDED_RELEASE_TABLET | ORAL | 0 refills | Status: DC

## 2019-08-17 NOTE — Telephone Encounter (Signed)
I called Mario Harmon pharmacy to let them know we wanted to start de-escalating his long acting medication from 60 mg morphine ER BID, to 30 mg Morphine ER po TID for 2 weeks, then 30 mg Morphine ER po BID until he resumes with Dr. Julien Nordmann who can decide if he needs any further long acting medication. A prior [name] will be needed. I will touch base with nursing to coordinate, but this new rx was sent to his pharmacy.

## 2019-08-17 NOTE — Telephone Encounter (Signed)
Patient requesting scheduled appointments for 08/18/19 be cancelled because he is vomiting. LVM for patient to call back to discuss this.  Patient returned call and left message "I plan to be there at 0745 for radiation, I do not plan to stay behind for any other appointment. I want appointment with Lab, Dr. Julien Nordmann and infusion cancelled"   Called patient back and LVM confirming to cancel his appointment and strongly encouraged him to consider seeing Dr. Julien Nordmann.

## 2019-08-17 NOTE — Telephone Encounter (Signed)
He needs to come at least for IV fluid and antiemetics.

## 2019-08-18 ENCOUNTER — Telehealth: Payer: Self-pay

## 2019-08-18 ENCOUNTER — Inpatient Hospital Stay: Payer: Medicare Other | Admitting: Internal Medicine

## 2019-08-18 ENCOUNTER — Ambulatory Visit: Payer: Medicare Other

## 2019-08-18 ENCOUNTER — Other Ambulatory Visit: Payer: Self-pay

## 2019-08-18 ENCOUNTER — Inpatient Hospital Stay: Payer: Medicare Other

## 2019-08-18 ENCOUNTER — Ambulatory Visit
Admission: RE | Admit: 2019-08-18 | Discharge: 2019-08-18 | Disposition: A | Discharge status: Home / Self Care | Payer: Medicare Other | Source: Ambulatory Visit | Attending: Radiation Oncology | Admitting: Radiation Oncology

## 2019-08-18 NOTE — Telephone Encounter (Signed)
TCT patient and spoke with his wife Diane. Informed her that Dr. Julien Nordmann would like patient to come in for some antiemetics or IV fluids. Wife verbalized agreement with this and promised to let patient know.

## 2019-08-18 NOTE — Telephone Encounter (Signed)
Received call from patient; he explained that his symptoms are more of "dry heaving retching with no vomiting". He denies loosing any fluids because he is not throwing up and admits he retains food and drinks. He added that the periods of heaving are "unpredictable, some days worse than others and the heaves can last up to 3-4 minutes".  Patient insists on staying home, he declined coming in for IV fluids and antiemetics stating he has and takes compazine and zofran.   Advised him to go to the ED if symptoms become intolerable and he verbalized understanding.

## 2019-08-19 ENCOUNTER — Telehealth: Payer: Self-pay

## 2019-08-19 ENCOUNTER — Encounter: Payer: Self-pay | Admitting: Radiation Oncology

## 2019-08-19 ENCOUNTER — Telehealth: Payer: Self-pay | Admitting: *Deleted

## 2019-08-19 ENCOUNTER — Other Ambulatory Visit: Payer: Self-pay | Admitting: Radiation Oncology

## 2019-08-19 ENCOUNTER — Ambulatory Visit: Admission: RE | Admit: 2019-08-19 | Payer: Medicare Other | Source: Ambulatory Visit

## 2019-08-19 DIAGNOSIS — C450 Mesothelioma of pleura: Secondary | ICD-10-CM | POA: Diagnosis not present

## 2019-08-19 MED ORDER — OXYCODONE HCL 5 MG PO TABS: 5.0000 mg | ORAL_TABLET | Freq: Four times a day (QID) | ORAL | 0 refills | Status: DC | PRN

## 2019-08-19 NOTE — Telephone Encounter (Signed)
Patient called about his Folic Acid refills, advised him that he has 4 refills and needs to call his pharmacy, verbalized understanding.  He also wanted to know about his radiation appointment today and pain medication refill, transferred patient to radiation.

## 2019-08-19 NOTE — Telephone Encounter (Signed)
Called the patient to let him know that his prescriptions have been called in to the pharmacy and should be ready for pick up later today.  I went over his prescriptions as his dosages have changed.  He verbalized understanding.  Will continue to follow as necessary.  Morphine:  -starting 08/19/2019, take 30 mg TID -starting 09/02/2019, take 30 mg BID  Oxycodone: - take 1-2 tablets every 6 hours  Mickayla Trouten M. Leonie Green, BSN

## 2019-08-24 ENCOUNTER — Telehealth: Payer: Self-pay | Admitting: Medical Oncology

## 2019-08-24 NOTE — Telephone Encounter (Signed)
Fu pt letter -pt describes regurgitation  He said he does not have Nausea or vomiting. He is constipated - ? R/t zofran. Message to [place].

## 2019-08-25 ENCOUNTER — Telehealth: Payer: Self-pay | Admitting: Medical Oncology

## 2019-08-25 ENCOUNTER — Other Ambulatory Visit: Payer: Self-pay | Admitting: Radiation Oncology

## 2019-08-25 ENCOUNTER — Telehealth: Payer: Self-pay | Admitting: Gastroenterology

## 2019-08-25 DIAGNOSIS — R1110 Vomiting, unspecified: Secondary | ICD-10-CM

## 2019-08-25 MED ORDER — PANTOPRAZOLE SODIUM 40 MG PO TBEC: DELAYED_RELEASE_TABLET | ORAL | 1 refills | Status: DC

## 2019-08-25 NOTE — Telephone Encounter (Signed)
Pt is scheduled for an OV with Dr. Rush Landmark 08/27/19.  Pt is concerned about not being able to predict when he would have episode of dry heaving and regurgitation.  Please call pt to discuss.

## 2019-08-25 NOTE — Telephone Encounter (Signed)
Mcihael notified to pick up protonix rx per Dr Yates Decamp GI recommendation and Worthy Flank e-scribed.   He has an appt Thursday with GI .

## 2019-08-25 NOTE — Telephone Encounter (Signed)
LVM to stop zofran and use compazine only as needed. LVM to return my call because he may need to see GI.

## 2019-08-25 NOTE — Progress Notes (Addendum)
I spoke with Dr. Ardis Hughs in GI about the patients complaints of wretching and dry heaving that he notified us of yesterday. Dr. Ardis Hughs would like to see him and suggested we start him on Protonix 40 mg q am prior to breakfast. Referral had been placed but Dr. Ardis Hughs said the patient could also call for his appt. Nursing notified.

## 2019-08-25 NOTE — Telephone Encounter (Signed)
The pt has been advised that he can bring whatever he needs with him to his appt.  He was concerned he would not be able to keep from dry heaving and didn't know if it would be ok for him to have an emesis basin or bag with him.

## 2019-08-25 NOTE — Telephone Encounter (Signed)
I spoke with Worthy Flank from radiation therapy today.  He is having some unusual dry heaving and regurgitation.  She is going to start him on once daily PPI for now.  Patty, He needs first available OV with any provider.  IF nothing available within 2-3 weeks then double book with me.  Thanks

## 2019-08-27 ENCOUNTER — Ambulatory Visit (INDEPENDENT_AMBULATORY_CARE_PROVIDER_SITE_OTHER): Payer: Medicare Other | Admitting: Gastroenterology

## 2019-08-27 ENCOUNTER — Other Ambulatory Visit (INDEPENDENT_AMBULATORY_CARE_PROVIDER_SITE_OTHER): Payer: Medicare Other

## 2019-08-27 ENCOUNTER — Encounter: Payer: Self-pay | Admitting: Gastroenterology

## 2019-08-27 VITALS — BP 146/78 | HR 112 | Temp 98.7°F | Ht 68.0 in | Wt 133.8 lb

## 2019-08-27 DIAGNOSIS — R1110 Vomiting, unspecified: Secondary | ICD-10-CM | POA: Diagnosis not present

## 2019-08-27 DIAGNOSIS — Z862 Personal history of diseases of the blood and blood-forming organs and certain disorders involving the immune mechanism: Secondary | ICD-10-CM

## 2019-08-27 DIAGNOSIS — Z87898 Personal history of other specified conditions: Secondary | ICD-10-CM | POA: Diagnosis not present

## 2019-08-27 DIAGNOSIS — K5900 Constipation, unspecified: Secondary | ICD-10-CM

## 2019-08-27 DIAGNOSIS — K5909 Other constipation: Secondary | ICD-10-CM

## 2019-08-27 LAB — CBC
HCT: 33.4 % — ABNORMAL LOW (ref 39.0–52.0)
Hemoglobin: 11.1 g/dL — ABNORMAL LOW (ref 13.0–17.0)
MCHC: 33.3 g/dL (ref 30.0–36.0)
MCV: 88.3 fl (ref 78.0–100.0)
Platelets: 459 10*3/uL — ABNORMAL HIGH (ref 150.0–400.0)
RBC: 3.79 Mil/uL — ABNORMAL LOW (ref 4.22–5.81)
RDW: 15.1 % (ref 11.5–15.5)
WBC: 8.3 10*3/uL (ref 4.0–10.5)

## 2019-08-27 LAB — TSH: TSH: 2.04 u[IU]/mL (ref 0.35–4.50)

## 2019-08-27 LAB — CORTISOL: Cortisol, Plasma: 16.8 ug/dL

## 2019-08-27 NOTE — Patient Instructions (Signed)
Continue Protonix.   Start Fiber Con once daily over the counter.   May use Senokot once daily as directed.   May use Dulcolax 100mg  1-2 daily.   Your provider has requested that you go to the basement level for lab work before leaving today. Press "B" on the elevator. The lab is located at the first door on the left as you exit the elevator.   Samples of Linzess 55mcg given- take once daily if no bowel movement with above regimen.   If you are age 72 or older, your body mass index should be between 23-30. Your Body mass index is 20.34 kg/m. If this is out of the aforementioned range listed, please consider follow up with your Primary Care Provider.  If you are age 72 or younger, your body mass index should be between 19-25. Your Body mass index is 20.34 kg/m. If this is out of the aformentioned range listed, please consider follow up with your Primary Care Provider.    Thank you for choosing me and [facility].  Dr. Rush Landmark

## 2019-08-28 LAB — LIPASE: Lipase: 5 U/L — ABNORMAL LOW (ref 11.0–59.0)

## 2019-08-28 LAB — IBC + FERRITIN
Ferritin: 815.8 ng/mL — ABNORMAL HIGH (ref 22.0–322.0)
Iron: 27 ug/dL — ABNORMAL LOW (ref 42–165)
Saturation Ratios: 13.7 % — ABNORMAL LOW (ref 20.0–50.0)
Transferrin: 141 mg/dL — ABNORMAL LOW (ref 212.0–360.0)

## 2019-08-28 LAB — IGA: IgA: 141 mg/dL (ref 68–378)

## 2019-08-28 LAB — FOLATE: Folate: >23.7 ng/mL (ref >5.9)

## 2019-08-28 LAB — VITAMIN B12: Vitamin B-12: 733 pg/mL (ref 211–911)

## 2019-08-28 NOTE — Progress Notes (Signed)
[place] VISIT   Primary Care Provider Patient, No Pcp Per No address on file None  Referring Provider Hayden Pedro, PA-C [place],  [place] [id number] [phone]  Patient Profile: Mario Harmon is a 74 y.o. male with a pmh significant for metastatic mesothelioma.  The patient presents to the [facility] for an evaluation and management of problem(s) noted below:  Problem List 1. Retching   2. History of nausea   3. Other constipation   4. History of anemia     History of Present Illness This is the patient's first visit to the outpatient [place] clinic.  He was recently diagnosed earlier this year with metastatic mesothelioma.  He has undergone chemotherapy with significant side effects.  Patient was referred by oncology due to issues of dry heaving and constipation for further evaluation.  States that he had no symptoms prior to chemotherapy initiation.  After his first round of chemo he developed nausea with vomiting.  For the last 3 weeks his biggest issue has been continued dry heaving.  He now has this occur between 5 and 10 times per day.  It can last between 1 and 3 minutes.  He does not describe significant nausea currently.  He was initiated on Protonix 2 days prior to today's visit.  He has only had one episode yesterday and one episode today.  The patient states he has cancer related chest pain.  He has had progressive loss of appetite over the course of the last few months.  His weight is decreasing.  He has lost almost 30 pounds in 3 months.  For the last 6 to 7 weeks the patient has had constipation.  He denies any blood in the stool.  He wonders if this is a result of the Zofran that he was using after his chemotherapy has been initiated.  He denies any tenesmus.  He has no abdominal pain.  There is no family history of GI malignancies.  The patient for his constipation has used Senokot which does not  help.  He denies MiraLAX or milk of magnesia helping either.  The patient denies any hematemesis or coffee-ground emesis.  Patient has never had endoscopy or colonoscopy.  The patient has not had any cross-sectional imaging of his abdomen/pelvis in years.  GI Review of Systems Positive as above Negative for odynophagia, melena, hematochezia  Review of Systems General: Denies fevers/chills HEENT: Denies oral lesions Cardiovascular: Denies current chest pain/palpitations  Pulmonary: Positive for shortness of breath, cough Gastroenterological: See HPI Genitourinary: Denies darkened urine Hematological: Denies easy bruising/bleeding Endocrine: Denies temperature intolerance Dermatological: Denies jaundice Psychological: Mood is anxious   Medications Current Outpatient Medications  Medication Sig Dispense Refill  . dexamethasone (DECADRON) 4 MG tablet 1 tablet p.o. twice daily the day before, day of and day after chemotherapy every 3 weeks. 40 tablet 1  . folic acid (FOLVITE) 1 MG tablet Take 1 tablet (1 mg total) by mouth daily. 30 tablet 4  . morphine (MS CONTIN) 30 MG 12 hr tablet Starting 08/19/19 take 30 mg po TID, then on 09/02/19 start taking 30 mg po BID (Patient taking differently: Take 30 mg by mouth every 12 (twelve) hours. ) 70 tablet 0  . OVER THE COUNTER MEDICATION Take 1 tablet by mouth 2 (two) times daily. Prostate health supplement    . oxyCODONE (OXY IR/ROXICODONE) 5 MG immediate release tablet Take 1-2 tablets (5-10 mg total) by mouth every 6 (six) hours  as needed for severe pain. 80 tablet 0  . pantoprazole (PROTONIX) 40 MG tablet Please take one po prior to breakfast 30 tablet 1  . prochlorperazine (COMPAZINE) 10 MG tablet Take 1 tablet (10 mg total) by mouth every 6 (six) hours as needed for nausea or vomiting. 30 tablet 0   No current facility-administered medications for this visit.    Allergies No Known Allergies  Histories Past Medical History:  Diagnosis Date   . Mesothelioma [facility])    Past Surgical History:  Procedure Laterality Date  . WRIST FRACTURE SURGERY Left    Social History   Socioeconomic History  . Marital status: Married    Spouse name: Not on file  . Number of children: Not on file  . Years of education: Not on file  . Highest education level: Not on file  Occupational History  . Occupation: retired    Comment: Mudlogger from [employer]  . Smoking status: Former Smoker    Types: Pipe  . Smokeless tobacco: Never Used  . Tobacco comment: many years ago he smoked a pipe.  Substance and Sexual Activity  . Alcohol use: Not Currently  . Drug use: Never  . Sexual activity: Not on file  Other Topics Concern  . Not on file  Social History Narrative   The patient is married and lives in [place]. He does not have any children. His sister also plays an active role in his care. 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   Social Determinants of Health   Financial Resource Strain:   . Difficulty of Paying Living Expenses: Not on file  Food Insecurity:   . Worried About Charity fundraiser in the Last Year: Not on file  . Ran Out of Food in the Last Year: Not on file  Transportation Needs:   . Lack of Transportation (Medical): Not on file  . Lack of Transportation (Non-Medical): Not on file  Physical Activity:   . Days of Exercise per Week: Not on file  . Minutes of Exercise per Session: Not on file  Stress:   . Feeling of Stress : Not on file  Social Connections:   . Frequency of Communication with Friends and Family: Not on file  . Frequency of Social Gatherings with Friends and Family: Not on file  . Attends Religious Services: Not on file  . Active Member of Clubs or Organizations: Not on file  . Attends Archivist Meetings: Not on file  . Marital Status: Not on file  Intimate  Partner Violence:   . Fear of Current or Ex-Partner: Not on file  . Emotionally Abused: Not on file  . Physically Abused: Not on file  . Sexually Abused: Not on file   Family History  Problem Relation Age of Onset  . Esophageal cancer Neg Hx   . Colon cancer Neg Hx   . Pancreatic cancer Neg Hx   . Liver disease Neg Hx   . Inflammatory bowel disease Neg Hx   . Stomach cancer Neg Hx   . Ulcerative colitis Neg Hx    I have reviewed his medical, social, and family history in detail and updated the electronic medical record as necessary.    PHYSICAL EXAMINATION  BP (!) 146/78   Pulse (!) 112   Temp 98.7 F (37.1 C)   Ht 5\' 8"  (1.727 m)   Wt 133 lb  12.8 oz (60.7 kg)   BMI 20.34 kg/m  Wt Readings from Last 3 Encounters:  08/27/19 133 lb 12.8 oz (60.7 kg)  08/11/19 142 lb 12.8 oz (64.8 kg)  08/01/19 152 lb 1.9 oz (69 kg)  GEN: NAD, appears stated age, doesn't appear chronically ill PSYCH: Cooperative, without pressured speech EYE: Conjunctivae pink, sclerae anicteric ENT: MMM, without oral ulcers CV: Nontachycardic RESP: Decreased breath sounds at the bases bilaterally GI: NABS, soft, NT/ND, without rebound or guarding, no HSM appreciated MSK/EXT: No significant lower extremity edema SKIN: No jaundice NEURO:  Alert & Oriented x 3, no focal deficits   REVIEW OF DATA  I reviewed the following data at the time of this encounter:  GI Procedures and Studies  No relevant studies to review  Laboratory Studies  No relevant studies to review  Imaging Studies  2017 CT renal stone protocol abdomen/pelvis IMPRESSION: Left obstructive uropathy caused by 4.4 mm left proximal ureteral calculus. Nonobstructive 3 mm right renal calculus. Heterogeneous appearance of the prostate gland. Please correlate with serum PSA values. Scattered colonic diverticulosis without evidence of diverticulitis.  2021 MRI brain IMPRESSION: No evidence of metastatic disease. Mild age related  volume loss and small-vessel change of the white matter Diffuse dural enhancement. This is most likely persistent from the previous head injury in 2011. Malignant etiology would be unlikely.   ASSESSMENT  Mr. Bressman is a 74 y.o. male  with a pmh significant for metastatic mesothelioma.  The patient is seen today for evaluation and management of:  1. Retching   2. History of nausea   3. Other constipation   4. History of anemia    The patient is hemodynamically stable.  From a clinical perspective the etiology of his recurrent dry heaving/retching is not clear.  He certainly may be at risk as a result of his recent nausea and vomiting to have had esophagitis gastritis or PUD develop such that he would have symptoms but he has no nausea currently.  We may end up needing to do cross-sectional imaging of his abdomen/pelvis to ensure that there is not other pathology that is increasing intra-abdominal pressure.  Diagnostic endoscopy should be considered as well.  Patient is feeling slightly better with the Protonix daily at this time, we will hold on endoscopy per patient desire.  However we can consider this as well as the CT scan based on how he evolves.  In regards to his constipation we will work on this by initiating Gu[place]upe as well as Senokot or Dulcolax and we will give samples of Linzess.  We will follow-up in approximately 4 weeks or if the patient begins to have recurrent symptoms that require [place] to move forward with diagnostic procedures and he will let us know.  He also has a history of anemia and we will do some basic lab works to rule out etiologies for this.  All patient questions were answered, to the best of my ability, and the patient agrees to the aforementioned plan of action with follow-up as indicated.   PLAN  Laboratories as outlined below Begin FiberCon once daily May use Senokot plus/minus Dulcolax in effort of trying to move bowels 64 to 80 ounces of fluid per day Linzess  samples of 72 mcg to be given if no bowel movement with regimen above Continue Protonix daily If recurrent symptoms of dry retching then should proceed with scheduling diagnostic endoscopy plus CT abdomen/pelvis with IV and oral contrast to ensure no other etiology in the intra-abdominal  cavity   Orders Placed This Encounter  Procedures  . CBC  . Lipase  . TSH  . Cortisol  . IBC + Ferritin  . B12  . Folate  . Tissue transglutaminase, IgA  . IgA    New Prescriptions   No medications on file   Modified Medications   No medications on file    Planned Follow Up No follow-ups on file.   Total Time in Face-to-Face and in Coordination of Care for patient including independent/personal interpretation/review of prior testing, medical history, examination, medication adjustment, communicating results with the patient directly, and documentation with the EHR is 45 minutes.   Justice Britain, MD [facility] Office # [phone]

## 2019-08-29 ENCOUNTER — Encounter: Payer: Self-pay | Admitting: Gastroenterology

## 2019-08-29 DIAGNOSIS — K5909 Other constipation: Secondary | ICD-10-CM | POA: Insufficient documentation

## 2019-08-29 DIAGNOSIS — Z862 Personal history of diseases of the blood and blood-forming organs and certain disorders involving the immune mechanism: Secondary | ICD-10-CM | POA: Insufficient documentation

## 2019-08-29 DIAGNOSIS — Z87898 Personal history of other specified conditions: Secondary | ICD-10-CM | POA: Insufficient documentation

## 2019-08-29 DIAGNOSIS — R1110 Vomiting, unspecified: Secondary | ICD-10-CM | POA: Insufficient documentation

## 2019-08-31 LAB — TISSUE TRANSGLUTAMINASE, IGA: (tTG) Ab, IgA: 1 U/mL

## 2019-09-01 NOTE — Progress Notes (Signed)
  Radiation Oncology         [phone]) [phone] ________________________________  Name: Mario Harmon MRN: [id number]  Date: 07/16/2019  DOB: 1945-12-09  SIMULATION AND TREATMENT PLANNING NOTE  DIAGNOSIS:     ICD-10-CM   1. 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  Identity was confirmed.  All relevant records and images related to the planned course of therapy were reviewed.   Written consent to proceed with treatment was confirmed which was freely given after reviewing the details related to the planned course of therapy had been reviewed with the patient.  Then, the patient was set-up in a stable reproducible  supine position for radiation therapy.  CT images were obtained.  Surface markings were placed.    Medically necessary complex treatment device(s) for immobilization:  Customized vac-lock bag.   The CT images were loaded into the planning software.  Then the target and avoidance structures were contoured.  Treatment planning then occurred.  The radiation prescription was entered and confirmed.  A total of 4 complex treatment devices were fabricated which relate to the designed radiation treatment fields. Additional reduced fields will be used as necessary to improve the dose homogeneity of the plan. Each of these customized fields/ complex treatment devices will be used on a daily basis during the radiation course. I have requested : 3D Simulation  I have requested a DVH of the following structures: target volume, spinal cord, lungs, heart.   The patient will undergo daily image guidance to ensure accurate localization of the target, and adequate minimize dose to the normal surrounding structures in close proximity to the target.   PLAN:  The patient will receive 37.5 Gy in 15 fractions.     ________________________________   Jodelle Gross, MD, PhD

## 2019-09-03 ENCOUNTER — Encounter: Payer: Self-pay | Admitting: Medical Oncology

## 2019-09-04 ENCOUNTER — Telehealth: Payer: Self-pay

## 2019-09-04 NOTE — Telephone Encounter (Signed)
Pt wanted clarification on his upcoming Lab, MD visit and Infusion appts. on 09/08/19. Stated that he will not be going in for any treatments until he speaks with Dr. Julien Nordmann. Informed patient that his infusion appt. comes after his visit with Dr Julien Nordmann, so he can discuss his concerns about the treatment then and decide what he wants to do after. Patient was agreeable and verbalized understanding.

## 2019-09-07 ENCOUNTER — Other Ambulatory Visit: Payer: Self-pay | Admitting: Medical Oncology

## 2019-09-07 ENCOUNTER — Other Ambulatory Visit: Payer: Self-pay | Admitting: Internal Medicine

## 2019-09-07 MED ORDER — OXYCODONE HCL 5 MG PO TABS: 5.0000 mg | ORAL_TABLET | Freq: Four times a day (QID) | ORAL | 0 refills | Status: DC | PRN

## 2019-09-07 NOTE — Telephone Encounter (Signed)
Requests refill for oxycodone, "one tablet left".

## 2019-09-07 NOTE — Telephone Encounter (Signed)
Hi Diane,  We were managing his medication during radiotherapy, and gave him instructions on tapering down to his pre treatment morphine and oxycodone. We will defer further pain management to you guys since he's been finished with XRT almost 4 weeks. If that is not what Dr. Julien Nordmann wants to do let me know, and we can get him in to see Dr. Hilma Favors in palliative to help with his medication. Thanks, Bryson Ha

## 2019-09-07 NOTE — Telephone Encounter (Signed)
Pt notified that Mario Harmon will refill his oxycodone #20 tablets and discuss his pain management at appt tomorrow. Pt voiced understanding.

## 2019-09-08 ENCOUNTER — Other Ambulatory Visit: Payer: Self-pay

## 2019-09-08 ENCOUNTER — Encounter: Payer: Self-pay | Admitting: Internal Medicine

## 2019-09-08 ENCOUNTER — Inpatient Hospital Stay: Payer: Medicare Other | Attending: Internal Medicine | Admitting: Internal Medicine

## 2019-09-08 ENCOUNTER — Encounter: Payer: Self-pay | Admitting: *Deleted

## 2019-09-08 ENCOUNTER — Inpatient Hospital Stay: Payer: Medicare Other

## 2019-09-08 VITALS — BP 138/90 | HR 76 | Temp 97.9°F | Resp 18 | Ht 68.0 in | Wt 133.4 lb

## 2019-09-08 DIAGNOSIS — Z79899 Other long term (current) drug therapy: Secondary | ICD-10-CM | POA: Diagnosis not present

## 2019-09-08 DIAGNOSIS — K5909 Other constipation: Secondary | ICD-10-CM

## 2019-09-08 DIAGNOSIS — R531 Weakness: Secondary | ICD-10-CM | POA: Insufficient documentation

## 2019-09-08 DIAGNOSIS — R112 Nausea with vomiting, unspecified: Secondary | ICD-10-CM | POA: Diagnosis not present

## 2019-09-08 DIAGNOSIS — Z5111 Encounter for antineoplastic chemotherapy: Secondary | ICD-10-CM

## 2019-09-08 DIAGNOSIS — C7989 Secondary malignant neoplasm of other specified sites: Secondary | ICD-10-CM | POA: Insufficient documentation

## 2019-09-08 DIAGNOSIS — C450 Mesothelioma of pleura: Secondary | ICD-10-CM

## 2019-09-08 DIAGNOSIS — R42 Dizziness and giddiness: Secondary | ICD-10-CM | POA: Diagnosis not present

## 2019-09-08 DIAGNOSIS — C7951 Secondary malignant neoplasm of bone: Secondary | ICD-10-CM | POA: Diagnosis not present

## 2019-09-08 DIAGNOSIS — R1110 Vomiting, unspecified: Secondary | ICD-10-CM

## 2019-09-08 DIAGNOSIS — G893 Neoplasm related pain (acute) (chronic): Secondary | ICD-10-CM | POA: Diagnosis not present

## 2019-09-08 DIAGNOSIS — Z5112 Encounter for antineoplastic immunotherapy: Secondary | ICD-10-CM | POA: Diagnosis present

## 2019-09-08 DIAGNOSIS — C771 Secondary and unspecified malignant neoplasm of intrathoracic lymph nodes: Secondary | ICD-10-CM | POA: Insufficient documentation

## 2019-09-08 DIAGNOSIS — R0781 Pleurodynia: Secondary | ICD-10-CM | POA: Diagnosis not present

## 2019-09-08 DIAGNOSIS — E860 Dehydration: Secondary | ICD-10-CM | POA: Insufficient documentation

## 2019-09-08 DIAGNOSIS — Z7189 Other specified counseling: Secondary | ICD-10-CM

## 2019-09-08 DIAGNOSIS — C782 Secondary malignant neoplasm of pleura: Secondary | ICD-10-CM | POA: Diagnosis not present

## 2019-09-08 DIAGNOSIS — Z862 Personal history of diseases of the blood and blood-forming organs and certain disorders involving the immune mechanism: Secondary | ICD-10-CM

## 2019-09-08 DIAGNOSIS — R5383 Other fatigue: Secondary | ICD-10-CM | POA: Insufficient documentation

## 2019-09-08 DIAGNOSIS — R339 Retention of urine, unspecified: Secondary | ICD-10-CM | POA: Diagnosis not present

## 2019-09-08 LAB — CMP (CANCER CENTER ONLY)
ALT: 17 U/L (ref 0–44)
AST: 19 U/L (ref 15–41)
Albumin: 2.9 g/dL — ABNORMAL LOW (ref 3.5–5.0)
Alkaline Phosphatase: 75 U/L (ref 38–126)
Anion gap: 12 (ref 5–15)
BUN: 10 mg/dL (ref 8–23)
CO2: 27 mmol/L (ref 22–32)
Calcium: 9.5 mg/dL (ref 8.9–10.3)
Chloride: 101 mmol/L (ref 98–111)
Creatinine: 0.99 mg/dL (ref 0.61–1.24)
GFR, Est AFR Am: >60 mL/min (ref >60)
GFR, Estimated: >60 mL/min (ref >60)
Glucose, Bld: 113 mg/dL — ABNORMAL HIGH (ref 70–99)
Potassium: 4.1 mmol/L (ref 3.5–5.1)
Sodium: 140 mmol/L (ref 135–145)
Total Bilirubin: 0.3 mg/dL (ref 0.3–1.2)
Total Protein: 6.5 g/dL (ref 6.5–8.1)

## 2019-09-08 LAB — CBC WITH DIFFERENTIAL (CANCER CENTER ONLY)
Abs Immature Granulocytes: 0.05 10*3/uL (ref 0.00–0.07)
Basophils Absolute: 0 10*3/uL (ref 0.0–0.1)
Basophils Relative: 1 %
Eosinophils Absolute: 0.2 10*3/uL (ref 0.0–0.5)
Eosinophils Relative: 3 %
HCT: 33.7 % — ABNORMAL LOW (ref 39.0–52.0)
Hemoglobin: 10.7 g/dL — ABNORMAL LOW (ref 13.0–17.0)
Immature Granulocytes: 1 %
Lymphocytes Relative: 3 %
Lymphs Abs: 0.2 10*3/uL — ABNORMAL LOW (ref 0.7–4.0)
MCH: 28.8 pg (ref 26.0–34.0)
MCHC: 31.8 g/dL (ref 30.0–36.0)
MCV: 90.8 fL (ref 80.0–100.0)
Monocytes Absolute: 0.9 10*3/uL (ref 0.1–1.0)
Monocytes Relative: 11 %
Neutro Abs: 6.2 10*3/uL (ref 1.7–7.7)
Neutrophils Relative %: 81 %
Platelet Count: 348 10*3/uL (ref 150–400)
RBC: 3.71 MIL/uL — ABNORMAL LOW (ref 4.22–5.81)
RDW: 15.6 % — ABNORMAL HIGH (ref 11.5–15.5)
WBC Count: 7.5 10*3/uL (ref 4.0–10.5)
nRBC: 0 % (ref 0.0–0.2)

## 2019-09-08 LAB — MAGNESIUM: Magnesium: 1.6 mg/dL — ABNORMAL LOW (ref 1.7–2.4)

## 2019-09-08 LAB — TOTAL PROTEIN, URINE DIPSTICK: Protein, ur: NEGATIVE mg/dL

## 2019-09-08 NOTE — Progress Notes (Signed)
Oncology Nurse Navigator Documentation  Oncology Nurse Navigator Flowsheets 09/08/2019  Navigator Location [facility]  Navigator Encounter Type Clinic/MDC/I spoke with patient today at cancer center.  He has not had any systemic therapy and is delaying care.  I spoke to him on why he was dealing. He is in pain and doesn't understand the treatment.  Patient was giving and explain about treatment plan and pain control measures.  Dr. Julien Nordmann changed pain medication to help address issue.  I am concerned about his only requesting pain medication when he has a lot of issues around his cancer care.  I will contact CSW to help address issues.   Patient Visit Type MedOnc  Treatment Phase -  Barriers/Navigation Needs Coordination of Care;Education;Pain  Education Newly Diagnosed Cancer Education;Other  Interventions Coordination of Care;Education;Psycho-Social Support;Referrals  Acuity Level 2-Minimal Needs (1-2 Barriers Identified)  Referrals Social Work  Coordination of Care Other  Education Method Verbal;Written  Time Spent with Patient 30

## 2019-09-08 NOTE — Progress Notes (Signed)
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  PRIOR THERAPY: Systemic chemotherapy with cisplatin 75 mg/M2, Alimta 500 mg/M2 and Avastin 15 mg/KG every 3 weeks.  First dose July 29, 2019.  Status post 1 cycle.  This was discontinued secondary to intolerance  CURRENT THERAPY: Systemic chemotherapy with carboplatin for AUC of 5, Alimta 500 mg/M2 and Avastin 15 mg/KG every 3 weeks.  First dose September 11, 2019.  INTERVAL HISTORY: Mario Harmon 74 y.o. male returns to the clinic today for follow-up visit.  The patient continues to complain of increasing fatigue and weakness as well as weight loss and right-sided chest pain.  He has been requesting a lot of pain medication recently.  He is currently on MS Contin 30 mg p.o. every 12 hours in addition to oxycodone for breakthrough pain and he takes between 4 to 5 tablets every day.  He has been requesting refill of his pain medication at regular basis without showing up for his appointment for treatment or follow-up visit.  He denied having any fever or chills.  He was evaluated by gastroenterology for his persistent dry heaves and started on treatment with Protonix.  He also had improvement of his constipation once he is starting using Dulcolax in addition to stool softener.  He was found to have iron deficiency anemia and it was recommended for him to receive iron infusion.  The patient was supposed to start the first cycle of his new chemotherapy regimen with carboplatin and Alimta as well as Avastin on August 18, 2019 but he did not show up for his  appointment and has been delaying the treatment for several weeks.  He still concerned about taking treatment with his general condition.  He was supposed to start the first cycle of this treatment today but he is also decided to delay the treatment.   MEDICAL HISTORY: Past Medical History:  Diagnosis Date  . Mesothelioma [facility])     ALLERGIES:  has No Known Allergies.  MEDICATIONS:  Current Outpatient Medications  Medication Sig Dispense Refill  . bisacodyl (DULCOLAX) 5 MG EC tablet Take 5 mg by mouth daily as needed for moderate constipation.    . folic acid (FOLVITE) 1 MG tablet Take 1 tablet (1 mg total) by mouth daily. 30 tablet 4  . morphine (MS CONTIN) 30 MG 12 hr tablet Starting 08/19/19 take 30 mg po TID, then on 09/02/19 start taking 30 mg po BID (Patient taking differently: Take 30 mg by mouth every 12 (twelve) hours. ) 70 tablet 0  . OVER THE COUNTER MEDICATION Take 1 tablet by mouth 2 (two) times daily. Prostate health supplement    . oxyCODONE (OXY IR/ROXICODONE) 5 MG immediate release tablet Take 1 tablet (5 mg total) by mouth every 6 (six) hours as needed for severe pain. 20 tablet 0  . pantoprazole (PROTONIX) 40 MG tablet Please take one po prior to breakfast 30 tablet 1  . polycarbophil (FIBERCON) 625 MG tablet Take 625 mg by mouth daily.    Marland Kitchen dexamethasone (DECADRON) 4 MG tablet 1 tablet p.o. twice daily the day before, day of and day after chemotherapy every 3  weeks. (Patient not taking: Reported on 09/08/2019) 40 tablet 1  . prochlorperazine (COMPAZINE) 10 MG tablet Take 1 tablet (10 mg total) by mouth every 6 (six) hours as needed for nausea or vomiting. (Patient not taking: Reported on 09/08/2019) 30 tablet 0   No current facility-administered medications for this visit.    SURGICAL HISTORY:  Past Surgical History:  Procedure Laterality Date  . WRIST FRACTURE SURGERY Left     REVIEW OF SYSTEMS:  Constitutional: positive for anorexia, fatigue and weight loss Eyes:  negative Ears, nose, mouth, throat, and face: negative Respiratory: positive for dyspnea on exertion and pleurisy/chest pain Cardiovascular: negative Gastrointestinal: positive for constipation and nausea Genitourinary:negative Integument/breast: negative Hematologic/lymphatic: negative Musculoskeletal:negative Neurological: negative Behavioral/Psych: negative Endocrine: negative Allergic/Immunologic: negative   PHYSICAL EXAMINATION: General appearance: alert, cooperative, fatigued and no distress Head: Normocephalic, without obvious abnormality, atraumatic Neck: no adenopathy, no JVD, supple, symmetrical, trachea midline and thyroid not enlarged, symmetric, no tenderness/mass/nodules Lymph nodes: Cervical, supraclavicular, and axillary nodes normal. Resp: diminished breath sounds RLL and dullness to percussion RLL Back: symmetric, no curvature. ROM normal. No CVA tenderness. Cardio: regular rate and rhythm, S1, S2 normal, no murmur, click, rub or gallop GI: soft, non-tender; bowel sounds normal; no masses,  no organomegaly Extremities: extremities normal, atraumatic, no cyanosis or edema Neurologic: Alert and oriented X 3, normal strength and tone. Normal symmetric reflexes. Normal coordination and gait  ECOG PERFORMANCE STATUS: 1 - Symptomatic but completely ambulatory  Blood pressure 138/90, pulse 76, temperature 97.9 F (36.6 C), temperature source Oral, resp. rate 18, height 5\' 8"  (1.727 m), weight 133 lb 6.4 oz (60.5 kg), SpO2 100 %.  LABORATORY DATA: Lab Results  Component Value Date   WBC 7.5 09/08/2019   HGB 10.7 (L) 09/08/2019   HCT 33.7 (L) 09/08/2019   MCV 90.8 09/08/2019   PLT 348 09/08/2019      Chemistry      Component Value Date/Time   NA 140 09/08/2019 0843   K 4.1 09/08/2019 0843   CL 101 09/08/2019 0843   CO2 27 09/08/2019 0843   BUN 10 09/08/2019 0843   CREATININE 0.99 09/08/2019 0843      Component Value Date/Time   CALCIUM 9.5 09/08/2019 0843     ALKPHOS 75 09/08/2019 0843   AST 19 09/08/2019 0843   ALT 17 09/08/2019 0843   BILITOT 0.3 09/08/2019 0843       RADIOGRAPHIC STUDIES: No results found.  ASSESSMENT AND PLAN: This is a very pleasant 74 years old white male with recently diagnosed malignant pleural mesothelioma involving the right chest with evidence of metastatic disease to the mediastinal lymph nodes and large pleural-based masses as well as right pleural effusion. The patient started systemic chemotherapy with cisplatin, Alimta and Avastin status post 1 cycle.  He has intolerance issues with the treatment with cisplatin with delayed nausea and dry heaves as well as the constipation. I had a lengthy discussion with the patient today about his current condition and treatment options.  I gave the patient the option of treatment with carboplatin, Alimta and Avastin versus palliative care and hospice referral. The patient declined the hospice option and he would like to try the systemic chemotherapy but refused to proceed with the treatment today as planned.  He wanted to delay it by several more days.  He is concerned about taking treatment when he has weight loss and right-sided chest pain.  I explained to the patient that the only way to help him decreasing his  chest pain and probably gaining some strength is to treat the malignancy.  I also discussed with him the option of palliative care and hospice if he is not interested in treatment but he declined this option. For pain management I will change his pain regimen to MS Contin 30 mg p.o. every 8 hours in addition to oxycodone for breakthrough pain.  I explained to the patient that I would like him to use less of the breakthrough pain medication and we will continue to adjust the long-acting pain medication at this point. For the dry heaves this is significantly improved after starting treatment with Protonix. For the iron deficiency, I recommended for the patient treatment with  iron infusion and he is in agreement with this option.  I would consider adding his treatment with the iron infusion with his weekly lab. For the constipation he will continue with the stool softener as well as Dulcolax. The patient will come back for follow-up visit 1 week after his treatment for evaluation and management of any adverse effect of his treatment. He was advised to call immediately if he has any concerning symptoms in the interval. The patient voices understanding of current disease status and treatment options and is in agreement with the current care plan. The total time spent in the appointment was 50 minutes. All questions were answered. The patient knows to call the clinic with any problems, questions or concerns. We can certainly see the patient much sooner if necessary.  Disclaimer: This note was dictated with voice recognition software. Similar sounding words can inadvertently be transcribed and may not be corrected upon review.

## 2019-09-10 ENCOUNTER — Telehealth: Payer: Self-pay | Admitting: Medical Oncology

## 2019-09-10 ENCOUNTER — Other Ambulatory Visit: Payer: Self-pay | Admitting: Internal Medicine

## 2019-09-10 ENCOUNTER — Encounter: Payer: Self-pay | Admitting: General Practice

## 2019-09-10 MED ORDER — OXYCODONE HCL 5 MG PO TABS: 5.0000 mg | ORAL_TABLET | Freq: Four times a day (QID) | ORAL | 0 refills | Status: DC | PRN

## 2019-09-10 NOTE — Telephone Encounter (Signed)
"  Dear Shauna Hugh:   Please communicate this information (each Thursday, as requested by Dr. Julien Nordmann). Average Pain Level is 4-6, constantly.    1.  Morphine Sulfer 30 MG Tablet.  >  Taking (1) tablet every (8) hours (3/day), as recommended.  >  29 tablets on hand as of 04:30 AM, 09/10/19.  >  Estimate refill will be needed before 09/19/19 (before supply is exhausted during a "no access" weekend).       2.  Oxycodone HCL 5 MG Tablet  >  Take tablet when needed for "break-through" pain, as recommended.  >  18 tablets on hand as of 04:30 AM, 09/10/19.  >  No estimate on refills.   Thank you,   Lynnda Shields"

## 2019-09-10 NOTE — Telephone Encounter (Signed)
I will refer his oxycodone to make sure he has enough for the weekend.  He will need to remind [place] next week to refill his MS Contin.  Thank you.

## 2019-09-10 NOTE — Progress Notes (Signed)
[place] CSW Progress Notes  Call to patient at request of nurse navigator.  Introduced self and role of Patient and [employer].  Patient requested my contact information in case he needed in future, was willing to receive an informational mailing.  Edwyna Shell, LCSW Clinical Social Worker Phone:  [phone] Cell:  [phone]

## 2019-09-11 ENCOUNTER — Encounter: Payer: Self-pay | Admitting: *Deleted

## 2019-09-11 ENCOUNTER — Telehealth: Payer: Self-pay | Admitting: Medical Oncology

## 2019-09-11 ENCOUNTER — Telehealth: Payer: Self-pay | Admitting: Internal Medicine

## 2019-09-11 NOTE — Progress Notes (Signed)
Oncology Nurse Navigator Documentation  Oncology Nurse Navigator Flowsheets 09/11/2019  Navigator Location [facility]  Navigator Encounter Type Other:  Patient Visit Type -  Treatment Phase -  Barriers/Navigation Needs Coordination of Care/I followed up on Mario Harmon schedule and noticed he was not scheduled for chemo next week.  I clarified that he needs chemo with Cassie PA.  I completed scheduling message to re-schedule chemo for next week and follow up with Dr. Julien Nordmann or Cassie the week after chemo.    Education -  Interventions Coordination of Care  Acuity Level 2-Minimal Needs (1-2 Barriers Identified)  Referrals -  Coordination of Care Other  Education Method -  Time Spent with Patient 30

## 2019-09-11 NOTE — Telephone Encounter (Signed)
R/s appt per 3/19 sch message - unable to reach pt . Left message with appt date and time

## 2019-09-11 NOTE — Telephone Encounter (Signed)
Confirmed appt for Monday 3/22.

## 2019-09-14 ENCOUNTER — Other Ambulatory Visit: Payer: Self-pay

## 2019-09-14 ENCOUNTER — Inpatient Hospital Stay: Payer: Medicare Other

## 2019-09-14 VITALS — BP 145/89 | HR 83 | Temp 98.2°F | Resp 20 | Ht 68.0 in | Wt 134.0 lb

## 2019-09-14 DIAGNOSIS — C450 Mesothelioma of pleura: Secondary | ICD-10-CM

## 2019-09-14 DIAGNOSIS — Z5112 Encounter for antineoplastic immunotherapy: Secondary | ICD-10-CM | POA: Diagnosis not present

## 2019-09-14 LAB — MAGNESIUM: Magnesium: 1.8 mg/dL (ref 1.7–2.4)

## 2019-09-14 LAB — CBC WITH DIFFERENTIAL (CANCER CENTER ONLY)
Abs Immature Granulocytes: 0.05 10*3/uL (ref 0.00–0.07)
Basophils Absolute: 0 10*3/uL (ref 0.0–0.1)
Basophils Relative: 0 %
Eosinophils Absolute: 0 10*3/uL (ref 0.0–0.5)
Eosinophils Relative: 0 %
HCT: 33 % — ABNORMAL LOW (ref 39.0–52.0)
Hemoglobin: 10.4 g/dL — ABNORMAL LOW (ref 13.0–17.0)
Immature Granulocytes: 0 %
Lymphocytes Relative: 3 %
Lymphs Abs: 0.3 10*3/uL — ABNORMAL LOW (ref 0.7–4.0)
MCH: 28.9 pg (ref 26.0–34.0)
MCHC: 31.5 g/dL (ref 30.0–36.0)
MCV: 91.7 fL (ref 80.0–100.0)
Monocytes Absolute: 0.8 10*3/uL (ref 0.1–1.0)
Monocytes Relative: 7 %
Neutro Abs: 10.4 10*3/uL — ABNORMAL HIGH (ref 1.7–7.7)
Neutrophils Relative %: 90 %
Platelet Count: 383 10*3/uL (ref 150–400)
RBC: 3.6 MIL/uL — ABNORMAL LOW (ref 4.22–5.81)
RDW: 16.2 % — ABNORMAL HIGH (ref 11.5–15.5)
WBC Count: 11.6 10*3/uL — ABNORMAL HIGH (ref 4.0–10.5)
nRBC: 0 % (ref 0.0–0.2)

## 2019-09-14 LAB — CMP (CANCER CENTER ONLY)
ALT: 21 U/L (ref 0–44)
AST: 19 U/L (ref 15–41)
Albumin: 3 g/dL — ABNORMAL LOW (ref 3.5–5.0)
Alkaline Phosphatase: 73 U/L (ref 38–126)
Anion gap: 11 (ref 5–15)
BUN: 14 mg/dL (ref 8–23)
CO2: 27 mmol/L (ref 22–32)
Calcium: 9.9 mg/dL (ref 8.9–10.3)
Chloride: 102 mmol/L (ref 98–111)
Creatinine: 0.99 mg/dL (ref 0.61–1.24)
GFR, Est AFR Am: >60 mL/min (ref >60)
GFR, Estimated: >60 mL/min (ref >60)
Glucose, Bld: 145 mg/dL — ABNORMAL HIGH (ref 70–99)
Potassium: 3.6 mmol/L (ref 3.5–5.1)
Sodium: 140 mmol/L (ref 135–145)
Total Bilirubin: 0.3 mg/dL (ref 0.3–1.2)
Total Protein: 6.7 g/dL (ref 6.5–8.1)

## 2019-09-14 MED ORDER — SODIUM CHLORIDE 0.9 % IV SOLN
435.5000 mg | Freq: Once | INTRAVENOUS | Status: AC
  Administered 2019-09-14: 13:00:00 440 mg via INTRAVENOUS
  Filled 2019-09-14: qty 44

## 2019-09-14 MED ORDER — DEXAMETHASONE SODIUM PHOSPHATE 10 MG/ML IJ SOLN
INTRAMUSCULAR | Status: AC
  Filled 2019-09-14: qty 1

## 2019-09-14 MED ORDER — PALONOSETRON HCL INJECTION 0.25 MG/5ML
INTRAVENOUS | Status: AC
  Filled 2019-09-14: qty 5

## 2019-09-14 MED ORDER — SODIUM CHLORIDE 0.9 % IV SOLN
500.0000 mg/m2 | Freq: Once | INTRAVENOUS | Status: AC
  Administered 2019-09-14: 12:00:00 900 mg via INTRAVENOUS
  Filled 2019-09-14: qty 20

## 2019-09-14 MED ORDER — SODIUM CHLORIDE 0.9 % IV SOLN
150.0000 mg | Freq: Once | INTRAVENOUS | Status: AC
  Administered 2019-09-14: 11:00:00 150 mg via INTRAVENOUS
  Filled 2019-09-14: qty 150

## 2019-09-14 MED ORDER — SODIUM CHLORIDE 0.9 % IV SOLN
14.5000 mg/kg | Freq: Once | INTRAVENOUS | Status: AC
  Administered 2019-09-14: 12:00:00 900 mg via INTRAVENOUS
  Filled 2019-09-14: qty 4

## 2019-09-14 MED ORDER — DEXAMETHASONE SODIUM PHOSPHATE 10 MG/ML IJ SOLN
10.0000 mg | Freq: Once | INTRAMUSCULAR | Status: AC
  Administered 2019-09-14: 11:00:00 10 mg via INTRAVENOUS

## 2019-09-14 MED ORDER — PALONOSETRON HCL INJECTION 0.25 MG/5ML
0.2500 mg | Freq: Once | INTRAVENOUS | Status: AC
  Administered 2019-09-14: 0.25 mg via INTRAVENOUS

## 2019-09-14 MED ORDER — SODIUM CHLORIDE 0.9 % IV SOLN
Freq: Once | INTRAVENOUS | Status: AC
  Filled 2019-09-14: qty 250

## 2019-09-14 NOTE — Patient Instructions (Addendum)
[facility] Discharge Instructions for Patients Receiving Chemotherapy  Today you received the following chemotherapy agents: Bevacizumab, Alimta, Carboplatin   To help prevent nausea and vomiting after your treatment, we encourage you to take your nausea medication as directed.    If you develop nausea and vomiting that is not controlled by your nausea medication, call the clinic.   BELOW ARE SYMPTOMS THAT SHOULD BE REPORTED IMMEDIATELY:  *FEVER GREATER THAN 100.5 F  *CHILLS WITH OR WITHOUT FEVER  NAUSEA AND VOMITING THAT IS NOT CONTROLLED WITH YOUR NAUSEA MEDICATION  *UNUSUAL SHORTNESS OF BREATH  *UNUSUAL BRUISING OR BLEEDING  TENDERNESS IN MOUTH AND THROAT WITH OR WITHOUT PRESENCE OF ULCERS  *URINARY PROBLEMS  *BOWEL PROBLEMS  UNUSUAL RASH Items with * indicate a potential emergency and should be followed up as soon as possible.  Feel free to call the clinic should you have any questions or concerns. The clinic phone number is [phone].  Please show the [facility] at check-in to the Emergency Department and triage nurse.  Carboplatin injection What is this medicine? CARBOPLATIN (KAR boe pla tin) is a chemotherapy drug. It targets fast dividing cells, like cancer cells, and causes these cells to die. This medicine is used to treat ovarian cancer and many other cancers. This medicine may be used for other purposes; ask your health care provider or pharmacist if you have questions. COMMON BRAND NAME(S): Paraplatin What should I tell my health care provider before I take this medicine? They need to know if you have any of these conditions:  blood disorders  hearing problems  kidney disease  recent or ongoing radiation therapy  an unusual or allergic reaction to carboplatin, cisplatin, other chemotherapy, other medicines, foods, dyes, or preservatives  pregnant or trying to get pregnant  breast-feeding How should I use this  medicine? This drug is usually given as an infusion into a vein. It is administered in a hospital or clinic by a specially trained health care professional. Talk to your pediatrician regarding the use of this medicine in children. Special care may be needed. Overdosage: If you think you have taken too much of this medicine contact a poison control center or emergency room at once. NOTE: This medicine is only for you. Do not share this medicine with others. What if I miss a dose? It is important not to miss a dose. Call your doctor or health care professional if you are unable to keep an appointment. What may interact with this medicine?  medicines for seizures  medicines to increase blood counts like filgrastim, pegfilgrastim, sargramostim  some antibiotics like amikacin, gentamicin, neomycin, streptomycin, tobramycin  vaccines Talk to your doctor or health care professional before taking any of these medicines:  acetaminophen  aspirin  ibuprofen  ketoprofen  naproxen This list may not describe all possible interactions. Give your health care provider a list of all the medicines, herbs, non-prescription drugs, or dietary supplements you use. Also tell them if you smoke, drink alcohol, or use illegal drugs. Some items may interact with your medicine. What should I watch for while using this medicine? Your condition will be monitored carefully while you are receiving this medicine. You will need important blood work done while you are taking this medicine. This drug may make you feel generally unwell. This is not uncommon, as chemotherapy can affect healthy cells as well as cancer cells. Report any side effects. Continue your course of treatment even though you feel ill unless your doctor  tells you to stop. In some cases, you may be given additional medicines to help with side effects. Follow all directions for their use. Call your doctor or health care professional for advice if you  get a fever, chills or sore throat, or other symptoms of a cold or flu. Do not treat yourself. This drug decreases your body's ability to fight infections. Try to avoid being around people who are sick. This medicine may increase your risk to bruise or bleed. Call your doctor or health care professional if you notice any unusual bleeding. Be careful brushing and flossing your teeth or using a toothpick because you may get an infection or bleed more easily. If you have any dental work done, tell your dentist you are receiving this medicine. Avoid taking products that contain aspirin, acetaminophen, ibuprofen, naproxen, or ketoprofen unless instructed by your doctor. These medicines may hide a fever. Do not become pregnant while taking this medicine. Women should inform their doctor if they wish to become pregnant or think they might be pregnant. There is a potential for serious side effects to an unborn child. Talk to your health care professional or pharmacist for more information. Do not breast-feed an infant while taking this medicine. What side effects may I notice from receiving this medicine? Side effects that you should report to your doctor or health care professional as soon as possible:  allergic reactions like skin rash, itching or hives, swelling of the face, lips, or tongue  signs of infection - fever or chills, cough, sore throat, pain or difficulty passing urine  signs of decreased platelets or bleeding - bruising, pinpoint red spots on the skin, black, tarry stools, nosebleeds  signs of decreased red blood cells - unusually weak or tired, fainting spells, lightheadedness  breathing problems  changes in hearing  changes in vision  chest pain  high blood pressure  low blood counts - This drug may decrease the number of white blood cells, red blood cells and platelets. You may be at increased risk for infections and bleeding.  nausea and vomiting  pain, swelling, redness or  irritation at the injection site  pain, tingling, numbness in the hands or feet  problems with balance, talking, walking  trouble passing urine or change in the amount of urine Side effects that usually do not require medical attention (report to your doctor or health care professional if they continue or are bothersome):  hair loss  loss of appetite  metallic taste in the mouth or changes in taste This list may not describe all possible side effects. Call your doctor for medical advice about side effects. You may report side effects to FDA at 1-800-FDA-1088. Where should I keep my medicine? This drug is given in a hospital or clinic and will not be stored at home. NOTE: This sheet is a summary. It may not cover all possible information. If you have questions about this medicine, talk to your doctor, pharmacist, or health care provider.  2020 Elsevier/Gold Standard (2007-09-16 14:38:05)

## 2019-09-15 ENCOUNTER — Other Ambulatory Visit: Payer: Self-pay

## 2019-09-15 ENCOUNTER — Telehealth: Payer: Self-pay | Admitting: Medical Oncology

## 2019-09-15 ENCOUNTER — Encounter (HOSPITAL_COMMUNITY): Payer: Self-pay | Admitting: Emergency Medicine

## 2019-09-15 ENCOUNTER — Emergency Department (HOSPITAL_COMMUNITY)
Admission: EM | Admit: 2019-09-15 | Discharge: 2019-09-15 | Disposition: A | Discharge status: Home / Self Care | Payer: Medicare Other | Attending: Emergency Medicine | Admitting: Emergency Medicine

## 2019-09-15 DIAGNOSIS — Z79899 Other long term (current) drug therapy: Secondary | ICD-10-CM | POA: Insufficient documentation

## 2019-09-15 DIAGNOSIS — Z87891 Personal history of nicotine dependence: Secondary | ICD-10-CM | POA: Insufficient documentation

## 2019-09-15 DIAGNOSIS — R339 Retention of urine, unspecified: Secondary | ICD-10-CM | POA: Insufficient documentation

## 2019-09-15 DIAGNOSIS — R319 Hematuria, unspecified: Secondary | ICD-10-CM | POA: Diagnosis not present

## 2019-09-15 LAB — BASIC METABOLIC PANEL
Anion gap: 11 (ref 5–15)
BUN: 20 mg/dL (ref 8–23)
CO2: 28 mmol/L (ref 22–32)
Calcium: 9.4 mg/dL (ref 8.9–10.3)
Chloride: 100 mmol/L (ref 98–111)
Creatinine, Ser: 0.94 mg/dL (ref 0.61–1.24)
GFR calc Af Amer: >60 mL/min (ref >60)
GFR calc non Af Amer: >60 mL/min (ref >60)
Glucose, Bld: 109 mg/dL — ABNORMAL HIGH (ref 70–99)
Potassium: 4.1 mmol/L (ref 3.5–5.1)
Sodium: 139 mmol/L (ref 135–145)

## 2019-09-15 LAB — URINALYSIS, ROUTINE W REFLEX MICROSCOPIC
Bacteria, UA: NONE SEEN
Glucose, UA: NEGATIVE mg/dL
Ketones, ur: NEGATIVE mg/dL
Leukocytes,Ua: NEGATIVE
Nitrite: NEGATIVE
Protein, ur: NEGATIVE mg/dL
Specific Gravity, Urine: 1.021 (ref 1.005–1.030)
pH: 5 (ref 5.0–8.0)

## 2019-09-15 LAB — CBC WITH DIFFERENTIAL/PLATELET
Abs Immature Granulocytes: 0.1 10*3/uL — ABNORMAL HIGH (ref 0.00–0.07)
Basophils Absolute: 0 10*3/uL (ref 0.0–0.1)
Basophils Relative: 0 %
Eosinophils Absolute: 0 10*3/uL (ref 0.0–0.5)
Eosinophils Relative: 0 %
HCT: 33.8 % — ABNORMAL LOW (ref 39.0–52.0)
Hemoglobin: 10.4 g/dL — ABNORMAL LOW (ref 13.0–17.0)
Immature Granulocytes: 1 %
Lymphocytes Relative: 1 %
Lymphs Abs: 0.1 10*3/uL — ABNORMAL LOW (ref 0.7–4.0)
MCH: 28.8 pg (ref 26.0–34.0)
MCHC: 30.8 g/dL (ref 30.0–36.0)
MCV: 93.6 fL (ref 80.0–100.0)
Monocytes Absolute: 0.8 10*3/uL (ref 0.1–1.0)
Monocytes Relative: 5 %
Neutro Abs: 14.1 10*3/uL — ABNORMAL HIGH (ref 1.7–7.7)
Neutrophils Relative %: 93 %
Platelets: 386 10*3/uL (ref 150–400)
RBC: 3.61 MIL/uL — ABNORMAL LOW (ref 4.22–5.81)
RDW: 16.8 % — ABNORMAL HIGH (ref 11.5–15.5)
WBC: 15.2 10*3/uL — ABNORMAL HIGH (ref 4.0–10.5)
nRBC: 0 % (ref 0.0–0.2)

## 2019-09-15 MED ORDER — TAMSULOSIN HCL 0.4 MG PO CAPS: 0.4000 mg | ORAL_CAPSULE | Freq: Every day | ORAL | 0 refills | Status: AC

## 2019-09-15 MED ORDER — CEPHALEXIN 500 MG PO CAPS
500.0000 mg | ORAL_CAPSULE | Freq: Once | ORAL | Status: AC
  Administered 2019-09-15: 500 mg via ORAL
  Filled 2019-09-15: qty 1

## 2019-09-15 MED ORDER — CEPHALEXIN 500 MG PO CAPS: 500.0000 mg | ORAL_CAPSULE | Freq: Two times a day (BID) | ORAL | 0 refills | Status: DC

## 2019-09-15 NOTE — ED Triage Notes (Signed)
Per pt, states he has not emptied his bladder since 0600-states issue started after his chemo yesterday-has not consulted oncologist

## 2019-09-15 NOTE — Telephone Encounter (Signed)
Urinary retention and hematuria - pt said he is going to ED .

## 2019-09-15 NOTE — ED Notes (Signed)
Bladder showed 299

## 2019-09-15 NOTE — ED Notes (Signed)
ED Provider at bedside. 

## 2019-09-15 NOTE — ED Notes (Signed)
Patient provided with urinal. Patient able to void small amounts at this time.

## 2019-09-15 NOTE — ED Provider Notes (Signed)
Mario Harmon Provider Note   CSN: [id number] Arrival date & time: 09/15/19  1300     History Chief Complaint  Patient presents with  . Urinary Retention  . Hematuria    Mario Harmon is a 74 y.o. male.  The history is provided by the patient and medical records. No language interpreter was used.  Hematuria   Mario Harmon is a 74 y.o. male who presents to the Emergency Department complaining of difficulty urinating. Sxs began this morning.  He  got up to urinate this morning he could only void a very small amount. He had a small amount of blood pass when he attempts to urinate. He can only urinate with great effort and it is only a small amount of dribble that is able to return. He has associated discomfort secondary to this need to urinate. He denies any fevers, chest pain, shortness of breath, nausea, vomiting. No prior similar symptoms. He does have a history of mesothelioma and just received his second round of chemotherapy yesterday.    Past Medical History:  Diagnosis Date  . Mesothelioma [facility])     Patient Active Problem List   Diagnosis Date Noted  . History of nausea 08/29/2019  . History of anemia 08/29/2019  . Retching 08/29/2019  . Other constipation 08/29/2019  . Nausea & vomiting 07/31/2019  . 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 Goals of care, counseling/discussion 07/21/2019  . Encounter for antineoplastic chemotherapy 07/21/2019  . Pleural effusion on right 06/29/2019  . 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 WRIST FRACTURE SURGERY Left        Family History  Problem Relation Age of Onset  . Esophageal cancer Neg Hx   . Colon cancer Neg Hx   . Pancreatic cancer Neg Hx   . Liver disease Neg Hx   . Inflammatory bowel disease Neg Hx   . Stomach cancer Neg Hx   .  Ulcerative colitis Neg Hx     Social History   Tobacco Use  . Smoking status: Former Smoker    Types: Pipe  . Smokeless tobacco: Never Used  . Tobacco comment: many years ago he smoked a pipe.  Substance Use Topics  . Alcohol use: Not Currently  . Drug use: Never    Home Medications Prior to Admission medications   Medication Sig Start Date End Date Taking? Authorizing Provider  bisacodyl (DULCOLAX) 5 MG EC tablet Take 5 mg by mouth daily as needed for moderate constipation.   Yes [provider]  dexamethasone (DECADRON) 4 MG tablet 1 tablet p.o. twice daily the day before, day of and day after chemotherapy every 3 weeks. 07/21/19  Yes Curt Bears, MD  folic acid (FOLVITE) 1 MG tablet Take 1 tablet (1 mg total) by mouth daily. 07/21/19  Yes Curt Bears, MD  morphine (MS CONTIN) 30 MG 12 hr tablet Starting 08/19/19 take 30 mg po TID, then on 09/02/19 start taking 30 mg po BID Patient taking differently: Take 30 mg by mouth every 12 (twelve) hours.  08/17/19  Yes Hayden Pedro, PA-C  OVER THE COUNTER MEDICATION Take 1 tablet by mouth 2 (two) times daily. Prostate health supplement   Yes [provider]  oxyCODONE (OXY IR/ROXICODONE) 5 MG immediate release tablet Take 1 tablet (5 mg total) by mouth every 6 (six) hours as needed  for severe pain. 09/10/19  Yes Curt Bears, MD  pantoprazole (PROTONIX) 40 MG tablet Please take one po prior to breakfast Patient taking differently: Take 40 mg by mouth daily.  08/25/19  Yes Hayden Pedro, PA-C  polycarbophil (FIBERCON) 625 MG tablet Take 625 mg by mouth daily.   Yes [provider]  prochlorperazine (COMPAZINE) 10 MG tablet Take 1 tablet (10 mg total) by mouth every 6 (six) hours as needed for nausea or vomiting. 08/14/19  Yes Heilingoetter, Cassandra L, PA-C  cephALEXin (KEFLEX) 500 MG capsule Take 1 capsule (500 mg total) by mouth 2 (two) times daily. 09/15/19   Quintella Reichert, MD  tamsulosin  (FLOMAX) 0.4 MG CAPS capsule Take 1 capsule (0.4 mg total) by mouth daily. 09/15/19   Quintella Reichert, MD    Allergies    Patient has no known allergies.  Review of Systems   Review of Systems  Genitourinary: Positive for hematuria.  All other systems reviewed and are negative.   Physical Exam Updated Vital Signs BP (!) 153/91 (BP Location: Right Arm)   Pulse 77   Temp 98.2 F (36.8 C) (Oral)   Resp 18   SpO2 98%   Physical Exam Vitals and nursing note reviewed.  Constitutional:      Appearance: He is well-developed.  HENT:     Head: Normocephalic and atraumatic.  Cardiovascular:     Rate and Rhythm: Normal rate and regular rhythm.     Heart sounds: No murmur.  Pulmonary:     Effort: Pulmonary effort is normal. No respiratory distress.     Breath sounds: Normal breath sounds.  Abdominal:     Palpations: Abdomen is soft.     Tenderness: There is no guarding or rebound.     Comments: Mild suprapubic tenderness  Musculoskeletal:        General: No tenderness.  Skin:    General: Skin is warm and dry.  Neurological:     Mental Status: He is alert and oriented to person, place, and time.  Psychiatric:        Behavior: Behavior normal.     ED Results / Procedures / Treatments   Labs (all labs ordered are listed, but only abnormal results are displayed) Labs Reviewed  URINALYSIS, ROUTINE W REFLEX MICROSCOPIC - Abnormal; Notable for the following components:      Result Value   Hgb urine dipstick SMALL (*)    Bilirubin Urine MODERATE (*)    All other components within normal limits  BASIC METABOLIC PANEL - Abnormal; Notable for the following components:   Glucose, Bld 109 (*)    All other components within normal limits  CBC WITH DIFFERENTIAL/PLATELET - Abnormal; Notable for the following components:   WBC 15.2 (*)    RBC 3.61 (*)    Hemoglobin 10.4 (*)    HCT 33.8 (*)    RDW 16.8 (*)    Neutro Abs 14.1 (*)    Lymphs Abs 0.1 (*)    Abs Immature Granulocytes  0.10 (*)    All other components within normal limits  URINE CULTURE    EKG None  Radiology No results found.  Procedures Procedures (including critical care time)  Medications Ordered in ED Medications  cephALEXin (KEFLEX) capsule 500 mg (500 mg Oral Given 09/15/19 1626)    ED Course  I have reviewed the triage vital signs and the nursing notes.  Pertinent labs & imaging results that were available during my care of the patient were reviewed by  me and considered in my medical decision making (see chart for details).    MDM Rules/Calculators/A&P                     Patient here for evaluation of urinary retention that began today. He did surrounding chemotherapy yesterday. He does have urinary retention with greater than 400 mL in this water. CBC with leukocytosis, unclear if this is secondary to stress response versus infection versus recent chemo. BMP with stable renal function. UA with blood present, no over whelming evidence of infection by given his symptoms and recent chemo will send a culture and treat with antibiotics pending culture. Discussed with patient home care for urinary retention. Discussed urology follow-up as well as return precautions.  Final Clinical Impression(s) / ED Diagnoses Final diagnoses:  Urinary retention    Rx / DC Orders ED Discharge Orders         Ordered    cephALEXin (KEFLEX) 500 MG capsule  2 times daily     09/15/19 1621    tamsulosin (FLOMAX) 0.4 MG CAPS capsule  Daily     09/15/19 1621           Quintella Reichert, MD 09/15/19 [phone]

## 2019-09-16 ENCOUNTER — Other Ambulatory Visit: Payer: Self-pay | Admitting: Radiation Oncology

## 2019-09-16 LAB — URINE CULTURE: Culture: NO GROWTH

## 2019-09-17 ENCOUNTER — Other Ambulatory Visit: Payer: Self-pay | Admitting: Physician Assistant

## 2019-09-17 ENCOUNTER — Telehealth: Payer: Self-pay | Admitting: Physician Assistant

## 2019-09-17 ENCOUNTER — Inpatient Hospital Stay: Payer: Medicare Other | Admitting: Physician Assistant

## 2019-09-17 DIAGNOSIS — G893 Neoplasm related pain (acute) (chronic): Secondary | ICD-10-CM

## 2019-09-17 MED ORDER — MORPHINE SULFATE ER 30 MG PO TBCR: 30.0000 mg | EXTENDED_RELEASE_TABLET | Freq: Three times a day (TID) | ORAL | 0 refills | Status: DC | PRN

## 2019-09-17 NOTE — Progress Notes (Deleted)
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  PRIOR THERAPY: Systemic chemotherapy with cisplatin 75 mg/M2, Alimta 500 mg/M2 and Avastin 15 mg/KG every 3 weeks.  First dose July 29, 2019.  Status post 1 cycle.  This was discontinued secondary to intolerance  CURRENT THERAPY: Systemic chemotherapy with carboplatin for AUC of 5, Alimta 500 mg/M2 and Avastin 15 mg/KG every 3 weeks.  First dose September 14, 2019. Status post 1 cycle  INTERVAL HISTORY: Mario Harmon 74 y.o. male returns       MEDICAL HISTORY: Past Medical History:  Diagnosis Date  . Mesothelioma [facility])     ALLERGIES:  has No Known Allergies.  MEDICATIONS:  Current Outpatient Medications  Medication Sig Dispense Refill  . bisacodyl (DULCOLAX) 5 MG EC tablet Take 5 mg by mouth daily as needed for moderate constipation.    . cephALEXin (KEFLEX) 500 MG capsule Take 1 capsule (500 mg total) by mouth 2 (two) times daily. 14 capsule 0  . dexamethasone (DECADRON) 4 MG tablet 1 tablet p.o. twice daily the day before, day of and day after chemotherapy every 3 weeks. 40 tablet 1  . folic acid (FOLVITE) 1 MG tablet Take 1 tablet (1 mg total) by mouth daily. 30 tablet 4  . morphine (MS CONTIN) 30 MG 12 hr tablet Take 1 tablet (30 mg total) by mouth every 8 (eight) hours as needed for pain. 42 tablet 0  . OVER THE COUNTER MEDICATION Take 1 tablet by mouth 2 (two) times daily. Prostate health supplement    . oxyCODONE (OXY IR/ROXICODONE) 5 MG immediate release tablet Take 1 tablet (5 mg total) by mouth every 6 (six) hours as needed for severe pain. 20 tablet 0  . pantoprazole (PROTONIX) 40 MG  tablet TAKE 1 TABLET BY MOUTH PRIOR TO BREAKFAST 30 tablet 1  . polycarbophil (FIBERCON) 625 MG tablet Take 625 mg by mouth daily.    . prochlorperazine (COMPAZINE) 10 MG tablet Take 1 tablet (10 mg total) by mouth every 6 (six) hours as needed for nausea or vomiting. 30 tablet 0  . tamsulosin (FLOMAX) 0.4 MG CAPS capsule Take 1 capsule (0.4 mg total) by mouth daily. 30 capsule 0   No current facility-administered medications for this visit.    SURGICAL HISTORY:  Past Surgical History:  Procedure Laterality Date  . WRIST FRACTURE SURGERY Left     REVIEW OF SYSTEMS:   Review of Systems  Constitutional: Negative for appetite change, chills, fatigue, fever and unexpected weight change.  HENT:   Negative for mouth sores, nosebleeds, sore throat and trouble swallowing.   Eyes: Negative for eye problems and icterus.  Respiratory: Negative for cough, hemoptysis, shortness of breath and wheezing.   Cardiovascular: Negative for chest pain and leg swelling.  Gastrointestinal: Negative for abdominal pain, constipation, diarrhea, nausea and vomiting.  Genitourinary: Negative for bladder incontinence, difficulty urinating, dysuria, frequency and hematuria.   Musculoskeletal: Negative for back pain, gait problem, neck pain and neck stiffness.  Skin: Negative for itching and rash.  Neurological: Negative for dizziness, extremity weakness, gait problem, headaches, light-headedness and seizures.  Hematological: Negative for adenopathy. Does not bruise/bleed easily.  Psychiatric/Behavioral: Negative for confusion, depression and sleep disturbance. The patient  is not nervous/anxious.     PHYSICAL EXAMINATION:  There were no vitals taken for this visit.  ECOG PERFORMANCE STATUS: {CHL ONC ECOG [id number]  Physical Exam  Constitutional: Oriented to person, place, and time and well-developed, well-nourished, and in no distress. No distress.  HENT:  Head: Normocephalic and atraumatic.   Mouth/Throat: Oropharynx is clear and moist. No oropharyngeal exudate.  Eyes: Conjunctivae are normal. Right eye exhibits no discharge. Left eye exhibits no discharge. No scleral icterus.  Neck: Normal range of motion. Neck supple.  Cardiovascular: Normal rate, regular rhythm, normal heart sounds and intact distal pulses.   Pulmonary/Chest: Effort normal and breath sounds normal. No respiratory distress. No wheezes. No rales.  Abdominal: Soft. Bowel sounds are normal. Exhibits no distension and no mass. There is no tenderness.  Musculoskeletal: Normal range of motion. Exhibits no edema.  Lymphadenopathy:    No cervical adenopathy.  Neurological: Alert and oriented to person, place, and time. Exhibits normal muscle tone. Gait normal. Coordination normal.  Skin: Skin is warm and dry. No rash noted. Not diaphoretic. No erythema. No pallor.  Psychiatric: Mood, memory and judgment normal.  Vitals reviewed.  LABORATORY DATA: Lab Results  Component Value Date   WBC 15.2 (H) 09/15/2019   HGB 10.4 (L) 09/15/2019   HCT 33.8 (L) 09/15/2019   MCV 93.6 09/15/2019   PLT 386 09/15/2019      Chemistry      Component Value Date/Time   NA 139 09/15/2019 1437   K 4.1 09/15/2019 1437   CL 100 09/15/2019 1437   CO2 28 09/15/2019 1437   BUN 20 09/15/2019 1437   CREATININE 0.94 09/15/2019 1437   CREATININE 0.99 09/14/2019 0742      Component Value Date/Time   CALCIUM 9.4 09/15/2019 1437   ALKPHOS 73 09/14/2019 0742   AST 19 09/14/2019 0742   ALT 21 09/14/2019 0742   BILITOT 0.3 09/14/2019 0742       RADIOGRAPHIC STUDIES:  No results found.   ASSESSMENT/PLAN:  No problem-specific Assessment & Plan notes found for this encounter.   No orders of the defined types were placed in this encounter.    Priscilla Finklea L Jaycey Gens, PA-C 09/17/19

## 2019-09-17 NOTE — Telephone Encounter (Signed)
I called and spoke to the patient today about his appointment. This appointment was an old appointment that was not cancelled after a schedule change. Let him know that he did not need to come in today and that his next appointment should be on 3/29 at 8:30 AM. Advised him to arrive at least 15 minutes early. He states he was going to have trouble getting to his appointment today anyway due to having a urinary catheter in today and needing that to be removed. Confirmed his appointments for 09/21/19 him.

## 2019-09-18 ENCOUNTER — Telehealth: Payer: Self-pay | Admitting: Medical Oncology

## 2019-09-18 NOTE — Telephone Encounter (Signed)
err

## 2019-09-18 NOTE — Telephone Encounter (Signed)
Did not tolerate 2nd infusion. Reports dizziness , not walking well,someone has to do his chores and errands. He still has a a urinary catheter since 3/23. It is scheduled to be removed by urology on tuesday. He is taking MSC tid -per Dr Julien Nordmann . HE requests change f/u to wed .Done

## 2019-09-21 ENCOUNTER — Inpatient Hospital Stay: Payer: Medicare Other | Admitting: Physician Assistant

## 2019-09-21 ENCOUNTER — Inpatient Hospital Stay: Payer: Medicare Other

## 2019-09-22 ENCOUNTER — Telehealth: Payer: Self-pay | Admitting: Radiation Oncology

## 2019-09-22 NOTE — Telephone Encounter (Signed)
  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   Narrative:  The patient was contacted today for routine follow-up. During treatment he did very well with radiotherapy and did not have significant desquamation. However, he did have significant pleural pain requiring long and short acting medication. He also had significant difficulty with reching and was seen by GI and started on PPI therapy. This helped significantly and the episodes of reching improve and as far as appetite, and improvement in pain with some modest adjustments in his medication regimen.   Impression/Plan: 1. 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 The patient has been doing well since completion of radiotherapy. We discussed that we would be happy to continue to follow him as needed as he continues with Dr. Julien Nordmann in medical oncology.    Carola Rhine, PAC

## 2019-09-23 ENCOUNTER — Telehealth: Payer: Self-pay | Admitting: Physician Assistant

## 2019-09-23 ENCOUNTER — Inpatient Hospital Stay: Payer: Medicare Other

## 2019-09-23 ENCOUNTER — Encounter: Payer: Self-pay | Admitting: Physician Assistant

## 2019-09-23 ENCOUNTER — Inpatient Hospital Stay (HOSPITAL_BASED_OUTPATIENT_CLINIC_OR_DEPARTMENT_OTHER): Payer: Medicare Other | Admitting: Physician Assistant

## 2019-09-23 ENCOUNTER — Other Ambulatory Visit: Payer: Self-pay

## 2019-09-23 VITALS — BP 129/81 | HR 93 | Temp 98.0°F | Resp 18 | Ht 68.0 in | Wt 131.7 lb

## 2019-09-23 DIAGNOSIS — C7951 Secondary malignant neoplasm of bone: Secondary | ICD-10-CM | POA: Diagnosis not present

## 2019-09-23 DIAGNOSIS — C782 Secondary malignant neoplasm of pleura: Secondary | ICD-10-CM

## 2019-09-23 DIAGNOSIS — Z5111 Encounter for antineoplastic chemotherapy: Secondary | ICD-10-CM

## 2019-09-23 DIAGNOSIS — C450 Mesothelioma of pleura: Secondary | ICD-10-CM

## 2019-09-23 DIAGNOSIS — G893 Neoplasm related pain (acute) (chronic): Secondary | ICD-10-CM

## 2019-09-23 DIAGNOSIS — Z5112 Encounter for antineoplastic immunotherapy: Secondary | ICD-10-CM | POA: Diagnosis not present

## 2019-09-23 LAB — CBC WITH DIFFERENTIAL (CANCER CENTER ONLY)
Abs Immature Granulocytes: 0.02 10*3/uL (ref 0.00–0.07)
Basophils Absolute: 0 10*3/uL (ref 0.0–0.1)
Basophils Relative: 0 %
Eosinophils Absolute: 0.1 10*3/uL (ref 0.0–0.5)
Eosinophils Relative: 3 %
HCT: 30.5 % — ABNORMAL LOW (ref 39.0–52.0)
Hemoglobin: 9.4 g/dL — ABNORMAL LOW (ref 13.0–17.0)
Immature Granulocytes: 1 %
Lymphocytes Relative: 7 %
Lymphs Abs: 0.2 10*3/uL — ABNORMAL LOW (ref 0.7–4.0)
MCH: 28.9 pg (ref 26.0–34.0)
MCHC: 30.8 g/dL (ref 30.0–36.0)
MCV: 93.8 fL (ref 80.0–100.0)
Monocytes Absolute: 0.4 10*3/uL (ref 0.1–1.0)
Monocytes Relative: 12 %
Neutro Abs: 2.4 10*3/uL (ref 1.7–7.7)
Neutrophils Relative %: 77 %
Platelet Count: 200 10*3/uL (ref 150–400)
RBC: 3.25 MIL/uL — ABNORMAL LOW (ref 4.22–5.81)
RDW: 16.4 % — ABNORMAL HIGH (ref 11.5–15.5)
WBC Count: 3.1 10*3/uL — ABNORMAL LOW (ref 4.0–10.5)
nRBC: 0 % (ref 0.0–0.2)

## 2019-09-23 LAB — CMP (CANCER CENTER ONLY)
ALT: 19 U/L (ref 0–44)
AST: 19 U/L (ref 15–41)
Albumin: 2.9 g/dL — ABNORMAL LOW (ref 3.5–5.0)
Alkaline Phosphatase: 73 U/L (ref 38–126)
Anion gap: 11 (ref 5–15)
BUN: 13 mg/dL (ref 8–23)
CO2: 28 mmol/L (ref 22–32)
Calcium: 9.2 mg/dL (ref 8.9–10.3)
Chloride: 101 mmol/L (ref 98–111)
Creatinine: 0.96 mg/dL (ref 0.61–1.24)
GFR, Est AFR Am: >60 mL/min (ref >60)
GFR, Estimated: >60 mL/min (ref >60)
Glucose, Bld: 98 mg/dL (ref 70–99)
Potassium: 3.7 mmol/L (ref 3.5–5.1)
Sodium: 140 mmol/L (ref 135–145)
Total Bilirubin: 0.3 mg/dL (ref 0.3–1.2)
Total Protein: 6.3 g/dL — ABNORMAL LOW (ref 6.5–8.1)

## 2019-09-23 LAB — MAGNESIUM: Magnesium: 1.7 mg/dL (ref 1.7–2.4)

## 2019-09-23 NOTE — Telephone Encounter (Signed)
Scheduled per los. Gave avs and calendar  

## 2019-09-23 NOTE — Progress Notes (Signed)
Mario Harmon OFFICE PROGRESS NOTE  Patient, No Pcp Per No address on file  DIAGNOSIS: Malignant pleural mesothelioma diagnosed in January 2021 and presented with multiple large hypermetabolic right pleural-based masses with at least 2 sites of chest wall invasion and associated rib erosions in addition to anterior mediastinal mass and right internal mammary lymphadenopathy.  PRIOR THERAPY: Systemic chemotherapy with cisplatin 75 mg/M2, Alimta 500 mg/M2 and Avastin 15 mg/KG every 3 weeks.  First dose July 29, 2019.  Status post 1 cycle.  This was discontinued secondary to intolerance  CURRENT THERAPY: Systemic chemotherapy with carboplatin for AUC of 5, Alimta 500 mg/M2 and Avastin 15 mg/KG every 3 weeks.  First dose September 14, 2019. Status post 1 cycle.   INTERVAL HISTORY: Mario Harmon 74 y.o. male returns to the clinic for a follow up visit. The patient has several concerns today. Following chemotherapy, the patient developed urinary retention.  He has a history of using over-the-counter products for difficulty urinating but he is never had it this significant before requiring an ER visit.  He had his Foley catheter removed yesterday.  He is urinating without any troubles at this point in time.  His second concern was related to dry heaves approximately 6-8 times a day following treatment.  He did not try and use Compazine as he has tried it in the past without significant relief in his symptoms.  The patient's dry heaves have subsided at this time.  He did not have any dry heaves yesterday.  He also had 1 day of dizziness approximately 2 days following infusion.  His dizziness was provoked by moving his head/rolling over in bed.  His dizziness also improved at this time.  Today, the patient denies any fever, chills, or night sweats.  He lost approximately 2 pounds since his last appointment.  He states that his appetite is picking up.  He denies any significant chest pain,  shortness of breath, cough, or hemoptysis.  He denies any nausea, vomiting, or diarrhea.  He frequently experiences constipation for which he takes Dulcolax with fairly good control of his symptoms.  He denies any headache or visual changes.  He is here today for evaluation and a 1 week follow-up visit after completing his first cycle of chemotherapy with carboplatin, Avastin, and Alimta.    MEDICAL HISTORY: Past Medical History:  Diagnosis Date  . Mesothelioma [facility])     ALLERGIES:  has No Known Allergies.  MEDICATIONS:  Current Outpatient Medications  Medication Sig Dispense Refill  . bisacodyl (DULCOLAX) 5 MG EC tablet Take 5 mg by mouth daily as needed for moderate constipation.    . cephALEXin (KEFLEX) 500 MG capsule Take 1 capsule (500 mg total) by mouth 2 (two) times daily. 14 capsule 0  . dexamethasone (DECADRON) 4 MG tablet 1 tablet p.o. twice daily the day before, day of and day after chemotherapy every 3 weeks. 40 tablet 1  . folic acid (FOLVITE) 1 MG tablet Take 1 tablet (1 mg total) by mouth daily. 30 tablet 4  . morphine (MS CONTIN) 30 MG 12 hr tablet Take 1 tablet (30 mg total) by mouth every 8 (eight) hours as needed for pain. 42 tablet 0  . OVER THE COUNTER MEDICATION Take 1 tablet by mouth 2 (two) times daily. Prostate health supplement    . oxyCODONE (OXY IR/ROXICODONE) 5 MG immediate release tablet Take 1 tablet (5 mg total) by mouth every 6 (six) hours as needed for severe pain. 20 tablet 0  .  pantoprazole (PROTONIX) 40 MG tablet TAKE 1 TABLET BY MOUTH PRIOR TO BREAKFAST 30 tablet 1  . polycarbophil (FIBERCON) 625 MG tablet Take 625 mg by mouth daily.    . prochlorperazine (COMPAZINE) 10 MG tablet Take 1 tablet (10 mg total) by mouth every 6 (six) hours as needed for nausea or vomiting. 30 tablet 0  . tamsulosin (FLOMAX) 0.4 MG CAPS capsule Take 1 capsule (0.4 mg total) by mouth daily. 30 capsule 0   No current facility-administered medications for this visit.     SURGICAL HISTORY:  Past Surgical History:  Procedure Laterality Date  . WRIST FRACTURE SURGERY Left     REVIEW OF SYSTEMS:   Review of Systems  Constitutional: Positive for fatigue, decreased appetite, and mild weight loss. Negative for chills and fever.  HENT: Negative for mouth sores, nosebleeds, sore throat and trouble swallowing.   Eyes: Negative for eye problems and icterus.  Respiratory: Negative for cough, hemoptysis, shortness of breath and wheezing.  Cardiovascular: Negative for chest pain and leg swelling.  Gastrointestinal: Positive for dry heaves (resolved). Positive for frequent constipation. Negative for abdominal pain, diarrhea, nausea and vomiting.  Genitourinary: Negative for bladder incontinence, difficulty urinating, dysuria, frequency and hematuria.   Musculoskeletal: Negative for back pain, gait problem, neck pain and neck stiffness.  Skin: Negative for itching and rash.  Neurological: Negative for dizziness (resolved), extremity weakness, gait problem, headaches, light-headedness and seizures.  Hematological: Negative for adenopathy. Does not bruise/bleed easily.  Psychiatric/Behavioral: Negative for confusion, depression and sleep disturbance. The patient is not nervous/anxious.     PHYSICAL EXAMINATION:  Blood pressure 129/81, pulse 93, temperature 98 F (36.7 C), temperature source Temporal, resp. rate 18, height 5\' 8"  (1.727 m), weight 131 lb 11.2 oz (59.7 kg), SpO2 100 %.  ECOG PERFORMANCE STATUS: 1 - Symptomatic but completely ambulatory  Physical Exam  Constitutional: Oriented to person, place, and time and well-developed, well-nourished, and in no distress.  HENT:  Head: Normocephalic and atraumatic.  Mouth/Throat: Oropharynx is clear and moist. No oropharyngeal exudate.  Eyes: Conjunctivae are normal. Right eye exhibits no discharge. Left eye exhibits no discharge. No scleral icterus.  Neck: Normal range of motion. Neck supple.  Cardiovascular:  Normal rate, regular rhythm, normal heart sounds and intact distal pulses.   Pulmonary/Chest: Effort normal and breath sounds normal. No respiratory distress. No wheezes. No rales.  Abdominal: Soft. Bowel sounds are normal. Exhibits no distension and no mass. There is no tenderness.  Musculoskeletal: Normal range of motion. Exhibits no edema.  Lymphadenopathy:    No cervical adenopathy.  Neurological: Alert and oriented to person, place, and time. Exhibits normal muscle tone. Gait normal. Coordination normal.  Skin: Skin is warm and dry. No rash noted. Not diaphoretic. No erythema. No pallor.  Psychiatric: Mood, memory and judgment normal.  Vitals reviewed.  LABORATORY DATA: Lab Results  Component Value Date   WBC 3.1 (L) 09/23/2019   HGB 9.4 (L) 09/23/2019   HCT 30.5 (L) 09/23/2019   MCV 93.8 09/23/2019   PLT 200 09/23/2019      Chemistry      Component Value Date/Time   NA 140 09/23/2019 1046   K 3.7 09/23/2019 1046   CL 101 09/23/2019 1046   CO2 28 09/23/2019 1046   BUN 13 09/23/2019 1046   CREATININE 0.96 09/23/2019 1046      Component Value Date/Time   CALCIUM 9.2 09/23/2019 1046   ALKPHOS 73 09/23/2019 1046   AST 19 09/23/2019 1046   ALT 19  09/23/2019 1046   BILITOT 0.3 09/23/2019 1046       RADIOGRAPHIC STUDIES:  No results found.   ASSESSMENT/PLAN:  This is a very pleasant 74 year old Caucasian male diagnosed with malignant pleural mesothelioma involving the right chest wall with evidence of metastatic disease in the mediastinal lymph nodes and a large pleural-based mass as well as a right pleural effusion.  The patient was diagnosed in January 2021.  The patient started systemic chemotherapy with cisplatin, Alimta, and Avastin.  He is status post 1 cycle.  This was discontinued secondary to intolerance with cisplatin with delayed nausea and dry heaving.  He recently started systemic chemotherapy with carboplatin, Alimta, and Avastin.  He is status post 1  cycle.  He experienced some dry heaving, fatigue, and decreased appetite after his first cycle of treatment.  His symptoms are improving at this time.  The patient was seen with Dr. Julien Nordmann today.  The patient had questions related to his schedule and dosing of chemotherapy.  Dr. Julien Nordmann discussed the dosing regimen of his treatment is every 3 weeks.  Dr. Julien Nordmann recommends that the patient continue on the same treatment at the same dose.  We will see the patient back for follow-up visit in 2 weeks for evaluation before starting cycle #2.  The patient will continue taking MS Contin 30 mg p.o. every 8 hours for his cancer related pain due to the rib erosions from his cancer.   The patient was encouraged to continue to eat and drink plenty of fluids.  Patient will continue to follow with urology regarding his urinary concerns.  Dr. Julien Nordmann had previously recommended that the patient have weekly iron infusions for 4 weeks. We will address this with the patient the next visit.   The patient was advised to call immediately if he has any concerning symptoms in the interval. The patient voices understanding of current disease status and treatment options and is in agreement with the current care plan. All questions were answered. The patient knows to call the clinic with any problems, questions or concerns. We can certainly see the patient much sooner if necessary   No orders of the defined types were placed in this encounter.    Austynn Pridmore L Sweetie Giebler, PA-C 09/23/19  ADDENDUM: Hematology/Oncology Attending: I had a face-to-face encounter with the patient today.  I recommended his care plan.  This is a pleasant 74 years old white male who was recently diagnosed with malignant pleural mesothelioma involving the right side of the chest is status post palliative radiotherapy to the painful lesion.  The patient started treatment with systemic chemotherapy initially with cisplatin, Alimta and  Avastin but this was discontinued secondary to intolerance with persistent nausea and vomiting as well as fatigue and weakness. I changed his treatment to carboplatin, Alimta and Avastin status post 1 cycle less than 10 days ago.  He tolerated this cycle much better than the previous treatment with cisplatin regimen.  He has few days of dry heaves and 1 day of dizziness likely secondary to dehydration. The patient has been trying to change the frequency of his chemotherapy and wanted to come back the end of the month for the next cycle of his treatment which will be 6 weeks from cycle #2.  I explained to the patient the importance of giving his treatment as performed on the clinical trials to achieve the expected benefit. His next dose of the treatment will be on October 05, 2019.  We will evaluate him before his treatment  with repeat blood work and physical examination. He was encouraged to increase his oral intake and hydration. The patient will come back for follow-up visit before starting cycle #2 of carboplatin, Alimta and Avastin. For pain management he will continue with the current treatment with MS Contin every 8 hours in addition to Percocet for breakthrough pain. He was advised to call immediately if he has any concerning symptoms in the interval.  Disclaimer: This note was dictated with voice recognition software. Similar sounding words can inadvertently be transcribed and may be missed upon review. Eilleen Kempf, MD 09/23/19

## 2019-09-29 NOTE — Progress Notes (Signed)
Pharmacist Chemotherapy Monitoring - Follow Up Assessment    I verify that I have reviewed each item in the below checklist:  . Regimen for the patient is scheduled for the appropriate day and plan matches scheduled date. Marland Kitchen Appropriate non-routine labs are ordered dependent on drug ordered. . If applicable, additional medications reviewed and ordered per protocol based on lifetime cumulative doses and/or treatment regimen.   Plan for follow-up and/or issues identified: No . I-vent associated with next due treatment: No . MD and/or nursing notified: No  Mario Harmon D 09/29/2019 2:57 PM

## 2019-10-01 ENCOUNTER — Telehealth: Payer: Self-pay | Admitting: Medical Oncology

## 2019-10-01 ENCOUNTER — Other Ambulatory Visit: Payer: Self-pay | Admitting: Internal Medicine

## 2019-10-01 DIAGNOSIS — G893 Neoplasm related pain (acute) (chronic): Secondary | ICD-10-CM

## 2019-10-01 MED ORDER — MORPHINE SULFATE ER 30 MG PO TBCR: 30.0000 mg | EXTENDED_RELEASE_TABLET | Freq: Three times a day (TID) | ORAL | 0 refills | Status: DC | PRN

## 2019-10-02 ENCOUNTER — Ambulatory Visit: Payer: Medicare Other | Admitting: Physician Assistant

## 2019-10-02 ENCOUNTER — Ambulatory Visit: Payer: Medicare Other

## 2019-10-02 ENCOUNTER — Other Ambulatory Visit: Payer: Medicare Other

## 2019-10-02 NOTE — Telephone Encounter (Signed)
refill equested

## 2019-10-05 ENCOUNTER — Inpatient Hospital Stay: Payer: Medicare Other

## 2019-10-05 ENCOUNTER — Encounter: Payer: Self-pay | Admitting: Internal Medicine

## 2019-10-05 ENCOUNTER — Inpatient Hospital Stay: Payer: Medicare Other | Attending: Internal Medicine | Admitting: Internal Medicine

## 2019-10-05 ENCOUNTER — Inpatient Hospital Stay: Payer: Medicare Other | Admitting: Nutrition

## 2019-10-05 ENCOUNTER — Other Ambulatory Visit: Payer: Self-pay

## 2019-10-05 VITALS — BP 131/80 | HR 90 | Temp 98.7°F | Resp 18 | Ht 68.0 in | Wt 136.3 lb

## 2019-10-05 DIAGNOSIS — C782 Secondary malignant neoplasm of pleura: Secondary | ICD-10-CM

## 2019-10-05 DIAGNOSIS — Z79899 Other long term (current) drug therapy: Secondary | ICD-10-CM | POA: Diagnosis not present

## 2019-10-05 DIAGNOSIS — C7989 Secondary malignant neoplasm of other specified sites: Secondary | ICD-10-CM | POA: Insufficient documentation

## 2019-10-05 DIAGNOSIS — Z5112 Encounter for antineoplastic immunotherapy: Secondary | ICD-10-CM | POA: Insufficient documentation

## 2019-10-05 DIAGNOSIS — C7951 Secondary malignant neoplasm of bone: Secondary | ICD-10-CM | POA: Diagnosis not present

## 2019-10-05 DIAGNOSIS — C450 Mesothelioma of pleura: Secondary | ICD-10-CM | POA: Diagnosis present

## 2019-10-05 DIAGNOSIS — G893 Neoplasm related pain (acute) (chronic): Secondary | ICD-10-CM

## 2019-10-05 DIAGNOSIS — Z5111 Encounter for antineoplastic chemotherapy: Secondary | ICD-10-CM | POA: Insufficient documentation

## 2019-10-05 DIAGNOSIS — J910 Malignant pleural effusion: Secondary | ICD-10-CM | POA: Insufficient documentation

## 2019-10-05 DIAGNOSIS — Z77090 Contact with and (suspected) exposure to asbestos: Secondary | ICD-10-CM | POA: Diagnosis not present

## 2019-10-05 LAB — CBC WITH DIFFERENTIAL (CANCER CENTER ONLY)
Abs Immature Granulocytes: 0.02 10*3/uL (ref 0.00–0.07)
Basophils Absolute: 0 10*3/uL (ref 0.0–0.1)
Basophils Relative: 0 %
Eosinophils Absolute: 0 10*3/uL (ref 0.0–0.5)
Eosinophils Relative: 0 %
HCT: 27.9 % — ABNORMAL LOW (ref 39.0–52.0)
Hemoglobin: 8.8 g/dL — ABNORMAL LOW (ref 13.0–17.0)
Immature Granulocytes: 0 %
Lymphocytes Relative: 3 %
Lymphs Abs: 0.2 10*3/uL — ABNORMAL LOW (ref 0.7–4.0)
MCH: 30.2 pg (ref 26.0–34.0)
MCHC: 31.5 g/dL (ref 30.0–36.0)
MCV: 95.9 fL (ref 80.0–100.0)
Monocytes Absolute: 0.5 10*3/uL (ref 0.1–1.0)
Monocytes Relative: 10 %
Neutro Abs: 4.7 10*3/uL (ref 1.7–7.7)
Neutrophils Relative %: 87 %
Platelet Count: 359 10*3/uL (ref 150–400)
RBC: 2.91 MIL/uL — ABNORMAL LOW (ref 4.22–5.81)
RDW: 18.3 % — ABNORMAL HIGH (ref 11.5–15.5)
WBC Count: 5.4 10*3/uL (ref 4.0–10.5)
nRBC: 0 % (ref 0.0–0.2)

## 2019-10-05 LAB — CMP (CANCER CENTER ONLY)
ALT: 30 U/L (ref 0–44)
AST: 23 U/L (ref 15–41)
Albumin: 2.8 g/dL — ABNORMAL LOW (ref 3.5–5.0)
Alkaline Phosphatase: 81 U/L (ref 38–126)
Anion gap: 11 (ref 5–15)
BUN: 17 mg/dL (ref 8–23)
CO2: 27 mmol/L (ref 22–32)
Calcium: 9.6 mg/dL (ref 8.9–10.3)
Chloride: 103 mmol/L (ref 98–111)
Creatinine: 1.02 mg/dL (ref 0.61–1.24)
GFR, Est AFR Am: >60 mL/min (ref >60)
GFR, Estimated: >60 mL/min (ref >60)
Glucose, Bld: 149 mg/dL — ABNORMAL HIGH (ref 70–99)
Potassium: 4.2 mmol/L (ref 3.5–5.1)
Sodium: 141 mmol/L (ref 135–145)
Total Bilirubin: <0.2 mg/dL — ABNORMAL LOW (ref 0.3–1.2)
Total Protein: 6.8 g/dL (ref 6.5–8.1)

## 2019-10-05 LAB — MAGNESIUM: Magnesium: 1.9 mg/dL (ref 1.7–2.4)

## 2019-10-05 MED ORDER — SODIUM CHLORIDE 0.9 % IV SOLN
348.4000 mg | Freq: Once | INTRAVENOUS | Status: AC
  Administered 2019-10-05: 15:00:00 350 mg via INTRAVENOUS
  Filled 2019-10-05: qty 35

## 2019-10-05 MED ORDER — CYANOCOBALAMIN 1000 MCG/ML IJ SOLN
1000.0000 ug | Freq: Once | INTRAMUSCULAR | Status: AC
  Administered 2019-10-05: 13:00:00 1000 ug via INTRAMUSCULAR

## 2019-10-05 MED ORDER — SODIUM CHLORIDE 0.9 % IV SOLN
400.0000 mg/m2 | Freq: Once | INTRAVENOUS | Status: AC
  Administered 2019-10-05: 700 mg via INTRAVENOUS
  Filled 2019-10-05: qty 8

## 2019-10-05 MED ORDER — SODIUM CHLORIDE 0.9 % IV SOLN
15.0000 mg/kg | Freq: Once | INTRAVENOUS | Status: AC
  Administered 2019-10-05: 15:00:00 1000 mg via INTRAVENOUS
  Filled 2019-10-05: qty 32

## 2019-10-05 MED ORDER — SODIUM CHLORIDE 0.9 % IV SOLN
150.0000 mg | Freq: Once | INTRAVENOUS | Status: AC
  Administered 2019-10-05: 13:00:00 150 mg via INTRAVENOUS
  Filled 2019-10-05: qty 150

## 2019-10-05 MED ORDER — SODIUM CHLORIDE 0.9 % IV SOLN
Freq: Once | INTRAVENOUS | Status: AC
  Filled 2019-10-05: qty 250

## 2019-10-05 MED ORDER — PALONOSETRON HCL INJECTION 0.25 MG/5ML
INTRAVENOUS | Status: AC
  Filled 2019-10-05: qty 5

## 2019-10-05 MED ORDER — CYANOCOBALAMIN 1000 MCG/ML IJ SOLN
INTRAMUSCULAR | Status: AC
  Filled 2019-10-05: qty 1

## 2019-10-05 MED ORDER — SODIUM CHLORIDE 0.9 % IV SOLN
10.0000 mg | Freq: Once | INTRAVENOUS | Status: AC
  Administered 2019-10-05: 13:00:00 10 mg via INTRAVENOUS
  Filled 2019-10-05: qty 10

## 2019-10-05 MED ORDER — PALONOSETRON HCL INJECTION 0.25 MG/5ML
0.2500 mg | Freq: Once | INTRAVENOUS | Status: AC
  Administered 2019-10-05: 13:00:00 0.25 mg via INTRAVENOUS

## 2019-10-05 NOTE — Progress Notes (Signed)
**Note Mario-Identified via Obfuscation** [facility] [phone]   [phone]  OFFICE PROGRESS NOTE  Patient, No Pcp Per No address on file  DIAGNOSIS: Malignant pleural mesothelioma diagnosed in January 2021 and presented with multiple large hypermetabolic right pleural-based masses with at least 2 sites of chest wall invasion and associated rib erosions in addition to anterior mediastinal mass and right internal mammary lymphadenopathy.  PRIOR THERAPY: Systemic chemotherapy with cisplatin 75 mg/M2, Alimta 500 mg/M2 and Avastin 15 mg/KG every 3 weeks.  First dose July 29, 2019.  Status post 1 cycle.  This was discontinued secondary to intolerance  CURRENT THERAPY: Systemic chemotherapy with carboplatin for AUC of 5, Alimta 500 mg/M2 and Avastin 15 mg/KG every 3 weeks.  First dose September 11, 2019.  Status post 1 cycle.  INTERVAL HISTORY: Mario Harmon 74 y.o. male returns to the clinic today for follow-up visit.  The patient is feeling fine today with no concerning complaints except for the fatigue for around 1 week after his last chemotherapy cycle.  He is feeling much better today.  He denied having any significant weight loss or night sweats.  He has no nausea, vomiting, diarrhea or constipation.  He has no headache or visual changes.  He has very mild right-sided chest pain rated as 2 on a scale from 1-10.  He has no shortness of breath, cough or hemoptysis.  He is here today for evaluation before starting cycle #2 of his treatment with carboplatin, Alimta and Avastin.   MEDICAL HISTORY: Past Medical History:  Diagnosis Date  . Mesothelioma [facility])     ALLERGIES:  has No Known Allergies.  MEDICATIONS:  Current Outpatient Medications  Medication Sig Dispense Refill  . bisacodyl (DULCOLAX) 5 MG EC tablet Take 5 mg by mouth daily as needed for moderate constipation.    . cephALEXin (KEFLEX) 500 MG capsule Take 1 capsule (500 mg total) by mouth 2 (two) times daily. 14 capsule 0  .  dexamethasone (DECADRON) 4 MG tablet 1 tablet p.o. twice daily the day before, day of and day after chemotherapy every 3 weeks. 40 tablet 1  . folic acid (FOLVITE) 1 MG tablet Take 1 tablet (1 mg total) by mouth daily. 30 tablet 4  . morphine (MS CONTIN) 30 MG 12 hr tablet Take 1 tablet (30 mg total) by mouth every 8 (eight) hours as needed for pain. 42 tablet 0  . OVER THE COUNTER MEDICATION Take 1 tablet by mouth 2 (two) times daily. Prostate health supplement    . oxyCODONE (OXY IR/ROXICODONE) 5 MG immediate release tablet Take 1 tablet (5 mg total) by mouth every 6 (six) hours as needed for severe pain. 20 tablet 0  . pantoprazole (PROTONIX) 40 MG tablet TAKE 1 TABLET BY MOUTH PRIOR TO BREAKFAST 30 tablet 1  . polycarbophil (FIBERCON) 625 MG tablet Take 625 mg by mouth daily.    . prochlorperazine (COMPAZINE) 10 MG tablet Take 1 tablet (10 mg total) by mouth every 6 (six) hours as needed for nausea or vomiting. 30 tablet 0  . tamsulosin (FLOMAX) 0.4 MG CAPS capsule Take 1 capsule (0.4 mg total) by mouth daily. 30 capsule 0   No current facility-administered medications for this visit.    SURGICAL HISTORY:  Past Surgical History:  Procedure Laterality Date  . WRIST FRACTURE SURGERY Left     REVIEW OF SYSTEMS:  A comprehensive review of systems was negative except for: Constitutional: positive for fatigue Respiratory: positive for pleurisy/chest pain  PHYSICAL EXAMINATION: General appearance: alert, cooperative, fatigued and no distress Head: Normocephalic, without obvious abnormality, atraumatic Neck: no adenopathy, no JVD, supple, symmetrical, trachea midline and thyroid not enlarged, symmetric, no tenderness/mass/nodules Lymph nodes: Cervical, supraclavicular, and axillary nodes normal. Resp: diminished breath sounds RLL and dullness to percussion RLL Back: symmetric, no curvature. ROM normal. No CVA tenderness. Cardio: regular rate and rhythm, S1, S2 normal, no murmur, click, rub  or gallop GI: soft, non-tender; bowel sounds normal; no masses,  no organomegaly Extremities: extremities normal, atraumatic, no cyanosis or edema  ECOG PERFORMANCE STATUS: 1 - Symptomatic but completely ambulatory  Blood pressure 131/80, pulse 90, temperature 98.7 F (37.1 C), temperature source Temporal, resp. rate 18, height 5\' 8"  (1.727 m), weight 136 lb 4.8 oz (61.8 kg), SpO2 100 %.  LABORATORY DATA: Lab Results  Component Value Date   WBC 5.4 10/05/2019   HGB 8.8 (L) 10/05/2019   HCT 27.9 (L) 10/05/2019   MCV 95.9 10/05/2019   PLT 359 10/05/2019      Chemistry      Component Value Date/Time   NA 140 09/23/2019 1046   K 3.7 09/23/2019 1046   CL 101 09/23/2019 1046   CO2 28 09/23/2019 1046   BUN 13 09/23/2019 1046   CREATININE 0.96 09/23/2019 1046      Component Value Date/Time   CALCIUM 9.2 09/23/2019 1046   ALKPHOS 73 09/23/2019 1046   AST 19 09/23/2019 1046   ALT 19 09/23/2019 1046   BILITOT 0.3 09/23/2019 1046       RADIOGRAPHIC STUDIES: No results found.  ASSESSMENT AND PLAN: This is a very pleasant 74 years old white male with recently diagnosed malignant pleural mesothelioma involving the right chest with evidence of metastatic disease to the mediastinal lymph nodes and large pleural-based masses as well as right pleural effusion. The patient started systemic chemotherapy with cisplatin, Alimta and Avastin status post 1 cycle.  He has intolerance issues with the treatment with cisplatin with delayed nausea and dry heaves as well as the constipation. We switched his treatment to carboplatin, Alimta and Avastin status post 1 cycle.  He tolerated the first cycle of this treatment much better except for fatigue for around 1 week. I recommended for him to proceed with cycle #2 today but I will reduce the dose of carboplatin to AUC of 4 and Alimta to 400 mg/M2 starting from cycle #2. I will see the patient back for follow-up visit in 3 weeks for evaluation with  repeat CT scan of the chest for restaging of his disease. For pain management he will continue on MS Contin 30 mg p.o. every 8 hours in addition to Percocet for breakthrough pain. The patient was advised to call immediately if he has any concerning symptoms in the interval. The patient voices understanding of current disease status and treatment options and is in agreement with the current care plan. The total time spent in the appointment was 50 minutes. All questions were answered. The patient knows to call the clinic with any problems, questions or concerns. We can certainly see the patient much sooner if necessary.  Disclaimer: This note was dictated with voice recognition software. Similar sounding words can inadvertently be transcribed and may not be corrected upon review.

## 2019-10-05 NOTE — Patient Instructions (Signed)
[facility] Discharge Instructions for Patients Receiving Chemotherapy  Today you received the following chemotherapy agents Bevacizumab (ZIRABEV), Pemetrexed (ALIMTA) & Carboplatin (PARAPLATIN).  To help prevent nausea and vomiting after your treatment, we encourage you to take your nausea medication as prescribed.   If you develop nausea and vomiting that is not controlled by your nausea medication, call the clinic.   BELOW ARE SYMPTOMS THAT SHOULD BE REPORTED IMMEDIATELY:  *FEVER GREATER THAN 100.5 F  *CHILLS WITH OR WITHOUT FEVER  NAUSEA AND VOMITING THAT IS NOT CONTROLLED WITH YOUR NAUSEA MEDICATION  *UNUSUAL SHORTNESS OF BREATH  *UNUSUAL BRUISING OR BLEEDING  TENDERNESS IN MOUTH AND THROAT WITH OR WITHOUT PRESENCE OF ULCERS  *URINARY PROBLEMS  *BOWEL PROBLEMS  UNUSUAL RASH Items with * indicate a potential emergency and should be followed up as soon as possible.  Feel free to call the clinic should you have any questions or concerns. The clinic phone number is [phone].  Please show the [facility] at check-in to the Emergency Department and triage nurse.

## 2019-10-05 NOTE — Progress Notes (Signed)
74 year old male diagnosed with mesothelioma.  He is a patient of Dr. Julien Nordmann receiving chemotherapy.   Patient was identified to be at risk for malnutrition on the MST secondary to weight loss and poor appetite.  Past medical history is not significant.  Medications include Dulcolax, Folvite, Protonix, and Compazine.  Labs include albumin 2.9 on March 31.  Height: 5 feet 8 inches. Weight: 136.3 pounds on April 12. Usual body weight: 157 pounds June 29, 2019. BMI: 20.72.  Patient does not believe that he has gained 5 pounds over 2 weeks. Reports he is trying to eat more food more often so that he can gain weight. Reports he has no appetite after chemotherapy.  He forces himself to eat. States appetite slowly improves 1-1/2 weeks after treatment is completed. Describes he is dry heaving 7-8 times daily for 3 days after treatment but does not take nausea medicine.  States he was told not to take this preventatively. He is drinking 1 Ensure a day and thinks it is the original brand.  Nutrition diagnosis:  Inadequate oral intake related to mesothelioma as evidenced by 13% weight loss in 3 months which is significant.  Intervention: Patient was educated on strategies for consuming small, frequent meals and snacks with high-calorie, high-protein foods. Educated patient on oral nutrition supplements and encourage patient consume Ensure Enlive at least 1 bottle daily.  Provided coupons. Encourage patient to take nausea medicine for dry heaving.  Also provided education on foods to consume foods to avoid with nausea. Gave patient fact sheets.  Questions were answered.  Teach back method used.  Contact information provided.  Monitoring, evaluation, goals: Patient will tolerate adequate calories and protein to minimize weight loss.  Next visit: Patient will contact me for questions or concerns.  **Disclaimer: This note was dictated with voice recognition software. Similar sounding words  can inadvertently be transcribed and this note may contain transcription errors which may not have been corrected upon publication of note.**

## 2019-10-12 ENCOUNTER — Inpatient Hospital Stay: Payer: Medicare Other

## 2019-10-12 ENCOUNTER — Other Ambulatory Visit: Payer: Self-pay

## 2019-10-12 DIAGNOSIS — C450 Mesothelioma of pleura: Secondary | ICD-10-CM

## 2019-10-12 DIAGNOSIS — Z5112 Encounter for antineoplastic immunotherapy: Secondary | ICD-10-CM | POA: Diagnosis not present

## 2019-10-12 LAB — CMP (CANCER CENTER ONLY)
ALT: 22 U/L (ref 0–44)
AST: 18 U/L (ref 15–41)
Albumin: 2.7 g/dL — ABNORMAL LOW (ref 3.5–5.0)
Alkaline Phosphatase: 72 U/L (ref 38–126)
Anion gap: 10 (ref 5–15)
BUN: 12 mg/dL (ref 8–23)
CO2: 28 mmol/L (ref 22–32)
Calcium: 9.1 mg/dL (ref 8.9–10.3)
Chloride: 100 mmol/L (ref 98–111)
Creatinine: 0.81 mg/dL (ref 0.61–1.24)
GFR, Est AFR Am: >60 mL/min (ref >60)
GFR, Estimated: >60 mL/min (ref >60)
Glucose, Bld: 102 mg/dL — ABNORMAL HIGH (ref 70–99)
Potassium: 4.3 mmol/L (ref 3.5–5.1)
Sodium: 138 mmol/L (ref 135–145)
Total Bilirubin: 0.3 mg/dL (ref 0.3–1.2)
Total Protein: 6.3 g/dL — ABNORMAL LOW (ref 6.5–8.1)

## 2019-10-12 LAB — CBC WITH DIFFERENTIAL (CANCER CENTER ONLY)
Abs Immature Granulocytes: 0.01 10*3/uL (ref 0.00–0.07)
Basophils Absolute: 0 10*3/uL (ref 0.0–0.1)
Basophils Relative: 1 %
Eosinophils Absolute: 0 10*3/uL (ref 0.0–0.5)
Eosinophils Relative: 1 %
HCT: 27.5 % — ABNORMAL LOW (ref 39.0–52.0)
Hemoglobin: 8.6 g/dL — ABNORMAL LOW (ref 13.0–17.0)
Immature Granulocytes: 1 %
Lymphocytes Relative: 8 %
Lymphs Abs: 0.1 10*3/uL — ABNORMAL LOW (ref 0.7–4.0)
MCH: 29.6 pg (ref 26.0–34.0)
MCHC: 31.3 g/dL (ref 30.0–36.0)
MCV: 94.5 fL (ref 80.0–100.0)
Monocytes Absolute: 0.3 10*3/uL (ref 0.1–1.0)
Monocytes Relative: 21 %
Neutro Abs: 1.1 10*3/uL — ABNORMAL LOW (ref 1.7–7.7)
Neutrophils Relative %: 68 %
Platelet Count: 285 10*3/uL (ref 150–400)
RBC: 2.91 MIL/uL — ABNORMAL LOW (ref 4.22–5.81)
RDW: 17.8 % — ABNORMAL HIGH (ref 11.5–15.5)
WBC Count: 1.6 10*3/uL — ABNORMAL LOW (ref 4.0–10.5)
nRBC: 0 % (ref 0.0–0.2)

## 2019-10-12 LAB — MAGNESIUM: Magnesium: 1.8 mg/dL (ref 1.7–2.4)

## 2019-10-15 ENCOUNTER — Telehealth: Payer: Self-pay | Admitting: Medical Oncology

## 2019-10-15 NOTE — Telephone Encounter (Signed)
Requests to refill MS Contin.

## 2019-10-16 ENCOUNTER — Other Ambulatory Visit: Payer: Self-pay | Admitting: Internal Medicine

## 2019-10-16 DIAGNOSIS — G893 Neoplasm related pain (acute) (chronic): Secondary | ICD-10-CM

## 2019-10-16 MED ORDER — MORPHINE SULFATE ER 30 MG PO TBCR: 30.0000 mg | EXTENDED_RELEASE_TABLET | Freq: Three times a day (TID) | ORAL | 0 refills | Status: DC | PRN

## 2019-10-19 ENCOUNTER — Inpatient Hospital Stay: Payer: Medicare Other

## 2019-10-19 ENCOUNTER — Other Ambulatory Visit: Payer: Self-pay

## 2019-10-19 DIAGNOSIS — Z5112 Encounter for antineoplastic immunotherapy: Secondary | ICD-10-CM | POA: Diagnosis not present

## 2019-10-19 DIAGNOSIS — C450 Mesothelioma of pleura: Secondary | ICD-10-CM

## 2019-10-19 LAB — CBC WITH DIFFERENTIAL (CANCER CENTER ONLY)
Abs Immature Granulocytes: 0.02 10*3/uL (ref 0.00–0.07)
Basophils Absolute: 0 10*3/uL (ref 0.0–0.1)
Basophils Relative: 0 %
Eosinophils Absolute: 0 10*3/uL (ref 0.0–0.5)
Eosinophils Relative: 1 %
HCT: 25.3 % — ABNORMAL LOW (ref 39.0–52.0)
Hemoglobin: 8 g/dL — ABNORMAL LOW (ref 13.0–17.0)
Immature Granulocytes: 1 %
Lymphocytes Relative: 4 %
Lymphs Abs: 0.2 10*3/uL — ABNORMAL LOW (ref 0.7–4.0)
MCH: 30.2 pg (ref 26.0–34.0)
MCHC: 31.6 g/dL (ref 30.0–36.0)
MCV: 95.5 fL (ref 80.0–100.0)
Monocytes Absolute: 0.8 10*3/uL (ref 0.1–1.0)
Monocytes Relative: 18 %
Neutro Abs: 3.2 10*3/uL (ref 1.7–7.7)
Neutrophils Relative %: 76 %
Platelet Count: 135 10*3/uL — ABNORMAL LOW (ref 150–400)
RBC: 2.65 MIL/uL — ABNORMAL LOW (ref 4.22–5.81)
RDW: 18.1 % — ABNORMAL HIGH (ref 11.5–15.5)
WBC Count: 4.2 10*3/uL (ref 4.0–10.5)
nRBC: 0 % (ref 0.0–0.2)

## 2019-10-19 LAB — CMP (CANCER CENTER ONLY)
ALT: 22 U/L (ref 0–44)
AST: 21 U/L (ref 15–41)
Albumin: 2.6 g/dL — ABNORMAL LOW (ref 3.5–5.0)
Alkaline Phosphatase: 78 U/L (ref 38–126)
Anion gap: 9 (ref 5–15)
BUN: 21 mg/dL (ref 8–23)
CO2: 29 mmol/L (ref 22–32)
Calcium: 9.4 mg/dL (ref 8.9–10.3)
Chloride: 100 mmol/L (ref 98–111)
Creatinine: 0.84 mg/dL (ref 0.61–1.24)
GFR, Est AFR Am: >60 mL/min (ref >60)
GFR, Estimated: >60 mL/min (ref >60)
Glucose, Bld: 123 mg/dL — ABNORMAL HIGH (ref 70–99)
Potassium: 4.3 mmol/L (ref 3.5–5.1)
Sodium: 138 mmol/L (ref 135–145)
Total Bilirubin: 0.3 mg/dL (ref 0.3–1.2)
Total Protein: 6.4 g/dL — ABNORMAL LOW (ref 6.5–8.1)

## 2019-10-19 LAB — MAGNESIUM: Magnesium: 1.7 mg/dL (ref 1.7–2.4)

## 2019-10-20 NOTE — Progress Notes (Signed)
Pharmacist Chemotherapy Monitoring - Follow Up Assessment    I verify that I have reviewed each item in the below checklist:  . Regimen for the patient is scheduled for the appropriate day and plan matches scheduled date. Marland Kitchen Appropriate non-routine labs are ordered dependent on drug ordered. . If applicable, additional medications reviewed and ordered per protocol based on lifetime cumulative doses and/or treatment regimen.   Plan for follow-up and/or issues identified: No . I-vent associated with next due treatment: Yes . MD and/or nursing notified: No   Kennith Center, Pharm.D., CPP 10/20/2019@3 :56 PM

## 2019-10-22 ENCOUNTER — Ambulatory Visit: Payer: Medicare Other

## 2019-10-22 ENCOUNTER — Ambulatory Visit: Payer: Medicare Other | Admitting: Internal Medicine

## 2019-10-22 ENCOUNTER — Other Ambulatory Visit: Payer: Medicare Other

## 2019-10-23 ENCOUNTER — Encounter (HOSPITAL_COMMUNITY): Payer: Self-pay

## 2019-10-23 ENCOUNTER — Other Ambulatory Visit: Payer: Self-pay

## 2019-10-23 ENCOUNTER — Ambulatory Visit (HOSPITAL_COMMUNITY)
Admission: RE | Admit: 2019-10-23 | Discharge: 2019-10-23 | Disposition: A | Discharge status: Home / Self Care | Payer: Medicare Other | Source: Ambulatory Visit | Attending: Internal Medicine | Admitting: Internal Medicine

## 2019-10-23 DIAGNOSIS — Z77090 Contact with and (suspected) exposure to asbestos: Secondary | ICD-10-CM

## 2019-10-23 DIAGNOSIS — C450 Mesothelioma of pleura: Secondary | ICD-10-CM | POA: Diagnosis present

## 2019-10-23 MED ORDER — IOHEXOL 300 MG/ML  SOLN
75.0000 mL | Freq: Once | INTRAMUSCULAR | Status: AC | PRN
  Administered 2019-10-23: 75 mL via INTRAVENOUS

## 2019-10-23 MED ORDER — SODIUM CHLORIDE (PF) 0.9 % IJ SOLN
INTRAMUSCULAR | Status: AC
  Filled 2019-10-23: qty 50

## 2019-10-26 ENCOUNTER — Other Ambulatory Visit: Payer: Self-pay

## 2019-10-26 ENCOUNTER — Inpatient Hospital Stay: Payer: Medicare Other

## 2019-10-26 ENCOUNTER — Encounter: Payer: Self-pay | Admitting: Internal Medicine

## 2019-10-26 ENCOUNTER — Telehealth: Payer: Self-pay | Admitting: *Deleted

## 2019-10-26 ENCOUNTER — Other Ambulatory Visit: Payer: Self-pay | Admitting: *Deleted

## 2019-10-26 ENCOUNTER — Inpatient Hospital Stay: Payer: Medicare Other | Attending: Internal Medicine | Admitting: Internal Medicine

## 2019-10-26 VITALS — HR 82

## 2019-10-26 VITALS — BP 120/72 | HR 101 | Temp 98.2°F | Resp 17 | Ht 68.0 in | Wt 133.7 lb

## 2019-10-26 DIAGNOSIS — D6481 Anemia due to antineoplastic chemotherapy: Secondary | ICD-10-CM | POA: Insufficient documentation

## 2019-10-26 DIAGNOSIS — Z5112 Encounter for antineoplastic immunotherapy: Secondary | ICD-10-CM | POA: Insufficient documentation

## 2019-10-26 DIAGNOSIS — Z5111 Encounter for antineoplastic chemotherapy: Secondary | ICD-10-CM | POA: Insufficient documentation

## 2019-10-26 DIAGNOSIS — C782 Secondary malignant neoplasm of pleura: Secondary | ICD-10-CM

## 2019-10-26 DIAGNOSIS — Z79899 Other long term (current) drug therapy: Secondary | ICD-10-CM | POA: Diagnosis not present

## 2019-10-26 DIAGNOSIS — C450 Mesothelioma of pleura: Secondary | ICD-10-CM | POA: Insufficient documentation

## 2019-10-26 DIAGNOSIS — C7989 Secondary malignant neoplasm of other specified sites: Secondary | ICD-10-CM | POA: Insufficient documentation

## 2019-10-26 DIAGNOSIS — C771 Secondary and unspecified malignant neoplasm of intrathoracic lymph nodes: Secondary | ICD-10-CM | POA: Diagnosis not present

## 2019-10-26 DIAGNOSIS — C7951 Secondary malignant neoplasm of bone: Secondary | ICD-10-CM | POA: Diagnosis not present

## 2019-10-26 DIAGNOSIS — G893 Neoplasm related pain (acute) (chronic): Secondary | ICD-10-CM

## 2019-10-26 LAB — CMP (CANCER CENTER ONLY)
ALT: 37 U/L (ref 0–44)
AST: 21 U/L (ref 15–41)
Albumin: 2.6 g/dL — ABNORMAL LOW (ref 3.5–5.0)
Alkaline Phosphatase: 91 U/L (ref 38–126)
Anion gap: 11 (ref 5–15)
BUN: 17 mg/dL (ref 8–23)
CO2: 30 mmol/L (ref 22–32)
Calcium: 9.7 mg/dL (ref 8.9–10.3)
Chloride: 100 mmol/L (ref 98–111)
Creatinine: 0.86 mg/dL (ref 0.61–1.24)
GFR, Est AFR Am: >60 mL/min (ref >60)
GFR, Estimated: >60 mL/min (ref >60)
Glucose, Bld: 110 mg/dL — ABNORMAL HIGH (ref 70–99)
Potassium: 3.8 mmol/L (ref 3.5–5.1)
Sodium: 141 mmol/L (ref 135–145)
Total Bilirubin: <0.2 mg/dL — ABNORMAL LOW (ref 0.3–1.2)
Total Protein: 6.7 g/dL (ref 6.5–8.1)

## 2019-10-26 LAB — CBC WITH DIFFERENTIAL (CANCER CENTER ONLY)
Abs Immature Granulocytes: 0.09 10*3/uL — ABNORMAL HIGH (ref 0.00–0.07)
Basophils Absolute: 0 10*3/uL (ref 0.0–0.1)
Basophils Relative: 0 %
Eosinophils Absolute: 0.1 10*3/uL (ref 0.0–0.5)
Eosinophils Relative: 1 %
HCT: 26.3 % — ABNORMAL LOW (ref 39.0–52.0)
Hemoglobin: 8.2 g/dL — ABNORMAL LOW (ref 13.0–17.0)
Immature Granulocytes: 1 %
Lymphocytes Relative: 3 %
Lymphs Abs: 0.2 10*3/uL — ABNORMAL LOW (ref 0.7–4.0)
MCH: 30.4 pg (ref 26.0–34.0)
MCHC: 31.2 g/dL (ref 30.0–36.0)
MCV: 97.4 fL (ref 80.0–100.0)
Monocytes Absolute: 1 10*3/uL (ref 0.1–1.0)
Monocytes Relative: 12 %
Neutro Abs: 7.1 10*3/uL (ref 1.7–7.7)
Neutrophils Relative %: 83 %
Platelet Count: 441 10*3/uL — ABNORMAL HIGH (ref 150–400)
RBC: 2.7 MIL/uL — ABNORMAL LOW (ref 4.22–5.81)
RDW: 18.3 % — ABNORMAL HIGH (ref 11.5–15.5)
WBC Count: 8.5 10*3/uL (ref 4.0–10.5)
nRBC: 0 % (ref 0.0–0.2)

## 2019-10-26 LAB — MAGNESIUM: Magnesium: 1.9 mg/dL (ref 1.7–2.4)

## 2019-10-26 MED ORDER — PALONOSETRON HCL INJECTION 0.25 MG/5ML
0.2500 mg | Freq: Once | INTRAVENOUS | Status: AC
  Administered 2019-10-26: 0.25 mg via INTRAVENOUS

## 2019-10-26 MED ORDER — SODIUM CHLORIDE 0.9 % IV SOLN
150.0000 mg | Freq: Once | INTRAVENOUS | Status: AC
  Administered 2019-10-26: 150 mg via INTRAVENOUS
  Filled 2019-10-26: qty 150

## 2019-10-26 MED ORDER — SODIUM CHLORIDE 0.9 % IV SOLN
10.0000 mg | Freq: Once | INTRAVENOUS | Status: AC
  Administered 2019-10-26: 10 mg via INTRAVENOUS
  Filled 2019-10-26: qty 10

## 2019-10-26 MED ORDER — SODIUM CHLORIDE 0.9 % IV SOLN
348.4000 mg | Freq: Once | INTRAVENOUS | Status: AC
  Administered 2019-10-26: 350 mg via INTRAVENOUS
  Filled 2019-10-26: qty 35

## 2019-10-26 MED ORDER — SODIUM CHLORIDE 0.9 % IV SOLN
Freq: Once | INTRAVENOUS | Status: AC
  Filled 2019-10-26: qty 250

## 2019-10-26 MED ORDER — SODIUM CHLORIDE 0.9 % IV SOLN
15.0000 mg/kg | Freq: Once | INTRAVENOUS | Status: AC
  Administered 2019-10-26: 1000 mg via INTRAVENOUS
  Filled 2019-10-26: qty 32

## 2019-10-26 MED ORDER — SODIUM CHLORIDE 0.9 % IV SOLN
400.0000 mg/m2 | Freq: Once | INTRAVENOUS | Status: AC
  Administered 2019-10-26: 700 mg via INTRAVENOUS
  Filled 2019-10-26: qty 20

## 2019-10-26 MED ORDER — PALONOSETRON HCL INJECTION 0.25 MG/5ML
INTRAVENOUS | Status: AC
  Filled 2019-10-26: qty 5

## 2019-10-26 NOTE — Patient Instructions (Signed)
[facility] Discharge Instructions for Patients Receiving Chemotherapy  Today you received the following chemotherapy agents: Bevacizumab-bvzr Noah Charon), Pemetrexed (Alimta), and Carboplatin (Paraplatin)   To help prevent nausea and vomiting after your treatment, we encourage you to take your nausea medication as prescribed.    If you develop nausea and vomiting that is not controlled by your nausea medication, call the clinic.   BELOW ARE SYMPTOMS THAT SHOULD BE REPORTED IMMEDIATELY:  *FEVER GREATER THAN 100.5 F  *CHILLS WITH OR WITHOUT FEVER  NAUSEA AND VOMITING THAT IS NOT CONTROLLED WITH YOUR NAUSEA MEDICATION  *UNUSUAL SHORTNESS OF BREATH  *UNUSUAL BRUISING OR BLEEDING  TENDERNESS IN MOUTH AND THROAT WITH OR WITHOUT PRESENCE OF ULCERS  *URINARY PROBLEMS  *BOWEL PROBLEMS  UNUSUAL RASH Items with * indicate a potential emergency and should be followed up as soon as possible.  Feel free to call the clinic should you have any questions or concerns. The clinic phone number is [phone].  Please show the [facility] at check-in to the Emergency Department and triage nurse.

## 2019-10-26 NOTE — Telephone Encounter (Signed)
Ok. Thank you.

## 2019-10-26 NOTE — Progress Notes (Signed)
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  PRIOR THERAPY: Systemic chemotherapy with cisplatin 75 mg/M2, Alimta 500 mg/M2 and Avastin 15 mg/KG every 3 weeks.  First dose July 29, 2019.  Status post 1 cycle.  This was discontinued secondary to intolerance  CURRENT THERAPY: Systemic chemotherapy with carboplatin for AUC of 5, Alimta 500 mg/M2 and Avastin 15 mg/KG every 3 weeks.  First dose September 11, 2019.  Status post 2 cycles.  INTERVAL HISTORY: Mario Harmon 74 y.o. male returns to the clinic today for follow-up visit.  The patient continues to have the right-sided chest pain that was getting worse recently.  He is currently on MS Contin 30 mg p.o. every 8 hours in addition to Percocet for breakthrough pain.  He does not take his breakthrough pain medication regularly and mentions that he only uses 5 tablets in the last 7 weeks.  He denied having any shortness of breath except with exertion with mild cough and no hemoptysis.  He denied having any fever or chills.  He has no nausea, vomiting, diarrhea or constipation.  He denied having any headache or visual changes.  He tolerated the last cycle of his treatment reasonably well.  He is here today for evaluation with repeat CT scan of the chest for restaging of his disease before starting cycle #3 of his treatment.  MEDICAL HISTORY: Past Medical History:  Diagnosis Date  . Mesothelioma [facility])     ALLERGIES:  has No Known Allergies.  MEDICATIONS:  Current Outpatient Medications  Medication Sig Dispense Refill  . bisacodyl (DULCOLAX) 5 MG EC  tablet Take 5 mg by mouth daily as needed for moderate constipation.    Marland Kitchen dexamethasone (DECADRON) 4 MG tablet 1 tablet p.o. twice daily the day before, day of and day after chemotherapy every 3 weeks. 40 tablet 1  . folic acid (FOLVITE) 1 MG tablet Take 1 tablet (1 mg total) by mouth daily. 30 tablet 4  . morphine (MS CONTIN) 30 MG 12 hr tablet Take 1 tablet (30 mg total) by mouth every 8 (eight) hours as needed for pain. 42 tablet 0  . OVER THE COUNTER MEDICATION Take 1 tablet by mouth 2 (two) times daily. Prostate health supplement    . oxyCODONE (OXY IR/ROXICODONE) 5 MG immediate release tablet Take 1 tablet (5 mg total) by mouth every 6 (six) hours as needed for severe pain. 20 tablet 0  . pantoprazole (PROTONIX) 40 MG tablet TAKE 1 TABLET BY MOUTH PRIOR TO BREAKFAST 30 tablet 1  . polycarbophil (FIBERCON) 625 MG tablet Take 625 mg by mouth daily.    . tamsulosin (FLOMAX) 0.4 MG CAPS capsule Take 1 capsule (0.4 mg total) by mouth daily. 30 capsule 0  . finasteride (PROSCAR) 5 MG tablet Take 5 mg by mouth daily.    . prochlorperazine (COMPAZINE) 10 MG tablet Take 1 tablet (10 mg total) by mouth every 6 (six) hours as needed for nausea or vomiting. (Patient not taking: Reported on 10/26/2019) 30 tablet 0   No current facility-administered medications for this visit.    SURGICAL HISTORY:  Past Surgical History:  Procedure Laterality Date  .  WRIST FRACTURE SURGERY Left     REVIEW OF SYSTEMS:  Constitutional: positive for fatigue Eyes: negative Ears, nose, mouth, throat, and face: negative Respiratory: positive for cough, dyspnea on exertion and pleurisy/chest pain Cardiovascular: negative Gastrointestinal: negative Genitourinary:negative Integument/breast: negative Hematologic/lymphatic: negative Musculoskeletal:negative Neurological: negative Behavioral/Psych: negative Endocrine: negative Allergic/Immunologic: negative   PHYSICAL EXAMINATION: General appearance: alert,  cooperative, fatigued and no distress Head: Normocephalic, without obvious abnormality, atraumatic Neck: no adenopathy, no JVD, supple, symmetrical, trachea midline and thyroid not enlarged, symmetric, no tenderness/mass/nodules Lymph nodes: Cervical, supraclavicular, and axillary nodes normal. Resp: clear to auscultation bilaterally Back: symmetric, no curvature. ROM normal. No CVA tenderness. Cardio: regular rate and rhythm, S1, S2 normal, no murmur, click, rub or gallop GI: soft, non-tender; bowel sounds normal; no masses,  no organomegaly Extremities: extremities normal, atraumatic, no cyanosis or edema Neurologic: Alert and oriented X 3, normal strength and tone. Normal symmetric reflexes. Normal coordination and gait  ECOG PERFORMANCE STATUS: 1 - Symptomatic but completely ambulatory  Blood pressure 120/72, pulse (!) 101, temperature 98.2 F (36.8 C), temperature source Temporal, resp. rate 17, height 5\' 8"  (1.727 m), weight 133 lb 11.2 oz (60.6 kg), SpO2 98 %.  LABORATORY DATA: Lab Results  Component Value Date   WBC 8.5 10/26/2019   HGB 8.2 (L) 10/26/2019   HCT 26.3 (L) 10/26/2019   MCV 97.4 10/26/2019   PLT 441 (H) 10/26/2019      Chemistry      Component Value Date/Time   NA 138 10/19/2019 1205   K 4.3 10/19/2019 1205   CL 100 10/19/2019 1205   CO2 29 10/19/2019 1205   BUN 21 10/19/2019 1205   CREATININE 0.84 10/19/2019 1205      Component Value Date/Time   CALCIUM 9.4 10/19/2019 1205   ALKPHOS 78 10/19/2019 1205   AST 21 10/19/2019 1205   ALT 22 10/19/2019 1205   BILITOT 0.3 10/19/2019 1205       RADIOGRAPHIC STUDIES: CT Chest W Contrast  Result Date: 10/23/2019 CLINICAL DATA:  Asbestos exposure. Mesothelioma suspected. Right-sided chest pain. EXAM: CT CHEST WITH CONTRAST TECHNIQUE: Multidetector CT imaging of the chest was performed during intravenous contrast administration. CONTRAST:  24mL OMNIPAQUE IOHEXOL 300 MG/ML  SOLN COMPARISON:  PET-CT 07/08/2019.  Chest CT 06/27/2019. FINDINGS: Cardiovascular: The heart size is normal. No substantial pericardial effusion. No thoracic aortic aneurysm. Mediastinum/Nodes: Anterior mediastinal mass identified on prior study of 4.7 x 2.9 cm now measures 2.8 x 2.1 cm. Right internal mammary nodal disease measured previously at 2.2 cm now measures 1.5 cm when measured in a similar fashion on today's study (52/2). There is no hilar lymphadenopathy. The esophagus has normal imaging features. There is no axillary lymphadenopathy. Lungs/Pleura: The relatively diffuse pleural disease seen laterally and posteriorly in the mid right hemithorax is qualitatively decreased in the interval. Focal lesion in the lateral aspect of the mid chest wall with demonstrated rib involvement on the prior study has decreased. Margins of the lesion are difficult to discern on today's study but best estimate is that this lesion now measures 3.1 x 1.9 cm which compares to 4.3 x 2.7 cm when I measure the lesion on the prior study in a similar fashion. Lesion in the lateral costophrenic sulcus generating mass-effect on the liver measures 4.6 x 2.5 cm today compared to 6.6 x 3.3 cm when remeasured in a similar fashion on the prior study. Right pleural effusion has decreased in the interval and fluid loculated in the right major fissure has resolved  since the prior CT. Centrilobular emphsyema noted. 5 mm left upper lobe nodule on 45/7 is new in the interval 4 mm anterior left upper lobe nodule on 79/7 is new since prior. There is also a 6 mm anterior left upper lobe nodule on 96/7, additional nodules measuring in the 6-8 mm size range are seen in the lingula and left lower lobe (116/7) new since prior. Upper Abdomen: Unremarkable. Tumefactive sludge or noncalcified gallstone evident (155/2). Musculoskeletal: Erosion again noted in the right fourth and sixth ribs. IMPRESSION: 1. Interval decrease in size of the anterior mediastinal and right internal mammary nodal  metastases. 2. The multiple right-sided pleural plaques have also decreased since prior study. 3. Decreased right pleural effusion with interval resolution of the fluid loculated in the right major fissure. 4. Multiple new pulmonary nodules in the left lung have a sub solid appearance and measure 4-8 mm. Imaging features concerning for metastatic disease. 5. Erosion of the right fourth and sixth ribs, similar to prior. 6. Tumefactive sludge or noncalcified gallstone. Electronically Signed   By: Misty Stanley M.D.   On: 10/23/2019 13:25    ASSESSMENT AND PLAN: This is a very pleasant 74 years old white male with recently diagnosed malignant pleural mesothelioma involving the right chest with evidence of metastatic disease to the mediastinal lymph nodes and large pleural-based masses as well as right pleural effusion. The patient started systemic chemotherapy with cisplatin, Alimta and Avastin status post 1 cycle.  He has intolerance issues with the treatment with cisplatin with delayed nausea and dry heaves as well as the constipation. We switched his treatment to carboplatin, Alimta and Avastin status post 2 cycles.  The patient tolerated the last 2 cycles of his treatment fairly well except for fatigue. He had repeat CT scan of the chest performed recently.  I personally and independently reviewed the scan images and discussed the results with the patient today. His scan showed significant improvement in his disease in the right side of the chest as well as the liver.  There was development of few small pulmonary nodules in the left lung that need close monitoring on the upcoming imaging studies. I recommended for the patient to continue his treatment with the same regimen and he will proceed with cycle #3 with reduced dose carboplatin and Alimta in addition to Avastin.  I also discussed with the patient the option of palliative care and hospice referral but he declined this option and would like to  continue with chemotherapy. For pain management I will refer the patient to the pain clinic.  I will also change his medication to MS Contin 60 mg p.o. every 12 hour in addition to Percocet for the breakthrough pain. The patient will come back for follow-up visit in 3 weeks for evaluation before starting the next cycle of his treatment. For the chemotherapy induced anemia, I will recommended for the patient to receive 2 units of PRBCs transfusion but he would like to hold on this option for now.  He will call if he has any further fatigue. The patient was advised to call immediately if he has any concerning symptoms in the interval.  The patient voices understanding of current disease status and treatment options and is in agreement with the current care plan.  All questions were answered. The patient knows to call the clinic with any problems, questions or concerns. We can certainly see the patient much sooner if necessary.  Disclaimer: This note was dictated with voice recognition software. Similar  sounding words can inadvertently be transcribed and may not be corrected upon review.

## 2019-10-26 NOTE — Telephone Encounter (Signed)
Mario Harmon states he would like to have a portacath placed.

## 2019-10-26 NOTE — Telephone Encounter (Signed)
Order placed for port placement

## 2019-10-30 ENCOUNTER — Other Ambulatory Visit: Payer: Self-pay | Admitting: Internal Medicine

## 2019-10-30 ENCOUNTER — Telehealth: Payer: Self-pay | Admitting: *Deleted

## 2019-10-30 DIAGNOSIS — G893 Neoplasm related pain (acute) (chronic): Secondary | ICD-10-CM

## 2019-10-30 MED ORDER — MORPHINE SULFATE ER 60 MG PO TBCR: 60.0000 mg | EXTENDED_RELEASE_TABLET | Freq: Two times a day (BID) | ORAL | 0 refills | Status: DC

## 2019-10-30 NOTE — Telephone Encounter (Signed)
Received call from patient today requesting refill of his MSContin. He will run out of it tomorrow. He is taking MSContin 30 mg -2 tablets every 12 hours.  He would like the 60 mg tablets and would take 1 tablet every 12 hours. He uses [facility]

## 2019-11-02 ENCOUNTER — Inpatient Hospital Stay: Payer: Medicare Other

## 2019-11-02 ENCOUNTER — Other Ambulatory Visit: Payer: Self-pay

## 2019-11-02 DIAGNOSIS — C450 Mesothelioma of pleura: Secondary | ICD-10-CM

## 2019-11-02 DIAGNOSIS — Z5112 Encounter for antineoplastic immunotherapy: Secondary | ICD-10-CM | POA: Diagnosis not present

## 2019-11-02 LAB — CBC WITH DIFFERENTIAL (CANCER CENTER ONLY)
Abs Immature Granulocytes: 0.05 10*3/uL (ref 0.00–0.07)
Basophils Absolute: 0 10*3/uL (ref 0.0–0.1)
Basophils Relative: 0 %
Eosinophils Absolute: 0 10*3/uL (ref 0.0–0.5)
Eosinophils Relative: 1 %
HCT: 24.9 % — ABNORMAL LOW (ref 39.0–52.0)
Hemoglobin: 7.8 g/dL — ABNORMAL LOW (ref 13.0–17.0)
Immature Granulocytes: 2 %
Lymphocytes Relative: 6 %
Lymphs Abs: 0.2 10*3/uL — ABNORMAL LOW (ref 0.7–4.0)
MCH: 30.5 pg (ref 26.0–34.0)
MCHC: 31.3 g/dL (ref 30.0–36.0)
MCV: 97.3 fL (ref 80.0–100.0)
Monocytes Absolute: 0.3 10*3/uL (ref 0.1–1.0)
Monocytes Relative: 11 %
Neutro Abs: 2.2 10*3/uL (ref 1.7–7.7)
Neutrophils Relative %: 80 %
Platelet Count: 282 10*3/uL (ref 150–400)
RBC: 2.56 MIL/uL — ABNORMAL LOW (ref 4.22–5.81)
RDW: 17.5 % — ABNORMAL HIGH (ref 11.5–15.5)
WBC Count: 2.7 10*3/uL — ABNORMAL LOW (ref 4.0–10.5)
nRBC: 0 % (ref 0.0–0.2)

## 2019-11-02 LAB — CMP (CANCER CENTER ONLY)
ALT: 27 U/L (ref 0–44)
AST: 21 U/L (ref 15–41)
Albumin: 2.7 g/dL — ABNORMAL LOW (ref 3.5–5.0)
Alkaline Phosphatase: 80 U/L (ref 38–126)
Anion gap: 13 (ref 5–15)
BUN: 13 mg/dL (ref 8–23)
CO2: 28 mmol/L (ref 22–32)
Calcium: 9.5 mg/dL (ref 8.9–10.3)
Chloride: 99 mmol/L (ref 98–111)
Creatinine: 0.83 mg/dL (ref 0.61–1.24)
GFR, Est AFR Am: >60 mL/min (ref >60)
GFR, Estimated: >60 mL/min (ref >60)
Glucose, Bld: 115 mg/dL — ABNORMAL HIGH (ref 70–99)
Potassium: 4.2 mmol/L (ref 3.5–5.1)
Sodium: 140 mmol/L (ref 135–145)
Total Bilirubin: 0.4 mg/dL (ref 0.3–1.2)
Total Protein: 6.6 g/dL (ref 6.5–8.1)

## 2019-11-02 LAB — MAGNESIUM: Magnesium: 1.5 mg/dL — ABNORMAL LOW (ref 1.7–2.4)

## 2019-11-03 ENCOUNTER — Telehealth: Payer: Self-pay | Admitting: Medical Oncology

## 2019-11-03 ENCOUNTER — Other Ambulatory Visit: Payer: Self-pay | Admitting: Medical Oncology

## 2019-11-03 NOTE — Telephone Encounter (Signed)
-----   Message from Curt Bears, MD sent at 11/02/2019  1:24 PM EDT ----- He may benefit from 2 units of PRBCs transfusion if he agrees. ----- Message ----- From: Buel Ream, [facility] Sent: 11/02/2019  11:55 AM EDT To: Curt Bears, MD

## 2019-11-03 NOTE — Telephone Encounter (Signed)
He needs time to think about when he can schedule the transfusion. He will call back.

## 2019-11-04 ENCOUNTER — Other Ambulatory Visit: Payer: Self-pay | Admitting: Medical Oncology

## 2019-11-04 ENCOUNTER — Telehealth: Payer: Self-pay | Admitting: Medical Oncology

## 2019-11-04 DIAGNOSIS — Z862 Personal history of diseases of the blood and blood-forming organs and certain disorders involving the immune mechanism: Secondary | ICD-10-CM

## 2019-11-04 NOTE — Telephone Encounter (Signed)
Pt notified of blood transfusion appt.

## 2019-11-05 ENCOUNTER — Other Ambulatory Visit: Payer: Self-pay | Admitting: Student

## 2019-11-05 ENCOUNTER — Other Ambulatory Visit: Payer: Self-pay | Admitting: Emergency Medicine

## 2019-11-05 ENCOUNTER — Other Ambulatory Visit: Payer: Self-pay

## 2019-11-05 ENCOUNTER — Inpatient Hospital Stay: Payer: Medicare Other

## 2019-11-05 ENCOUNTER — Other Ambulatory Visit: Payer: Self-pay | Admitting: Physician Assistant

## 2019-11-05 DIAGNOSIS — Z862 Personal history of diseases of the blood and blood-forming organs and certain disorders involving the immune mechanism: Secondary | ICD-10-CM

## 2019-11-05 DIAGNOSIS — Z5112 Encounter for antineoplastic immunotherapy: Secondary | ICD-10-CM | POA: Diagnosis not present

## 2019-11-05 DIAGNOSIS — D649 Anemia, unspecified: Secondary | ICD-10-CM

## 2019-11-05 LAB — ABO/RH: ABO/RH(D): A POS

## 2019-11-05 LAB — CBC WITH DIFFERENTIAL (CANCER CENTER ONLY)
Abs Immature Granulocytes: 0.03 10*3/uL (ref 0.00–0.07)
Basophils Absolute: 0 10*3/uL (ref 0.0–0.1)
Basophils Relative: 0 %
Eosinophils Absolute: 0 10*3/uL (ref 0.0–0.5)
Eosinophils Relative: 0 %
HCT: 24.7 % — ABNORMAL LOW (ref 39.0–52.0)
Hemoglobin: 7.8 g/dL — ABNORMAL LOW (ref 13.0–17.0)
Immature Granulocytes: 1 %
Lymphocytes Relative: 3 %
Lymphs Abs: 0.2 10*3/uL — ABNORMAL LOW (ref 0.7–4.0)
MCH: 30.7 pg (ref 26.0–34.0)
MCHC: 31.6 g/dL (ref 30.0–36.0)
MCV: 97.2 fL (ref 80.0–100.0)
Monocytes Absolute: 1 10*3/uL (ref 0.1–1.0)
Monocytes Relative: 21 %
Neutro Abs: 3.7 10*3/uL (ref 1.7–7.7)
Neutrophils Relative %: 75 %
Platelet Count: 174 10*3/uL (ref 150–400)
RBC: 2.54 MIL/uL — ABNORMAL LOW (ref 4.22–5.81)
RDW: 17.5 % — ABNORMAL HIGH (ref 11.5–15.5)
WBC Count: 4.9 10*3/uL (ref 4.0–10.5)
nRBC: 0 % (ref 0.0–0.2)

## 2019-11-05 LAB — PREPARE RBC (CROSSMATCH)

## 2019-11-05 MED ORDER — ACETAMINOPHEN 325 MG PO TABS
ORAL_TABLET | ORAL | Status: AC
  Filled 2019-11-05: qty 2

## 2019-11-05 MED ORDER — SODIUM CHLORIDE 0.9% FLUSH
10.0000 mL | INTRAVENOUS | Status: DC | PRN
  Filled 2019-11-05: qty 10

## 2019-11-05 MED ORDER — SODIUM CHLORIDE 0.9% IV SOLUTION
250.0000 mL | Freq: Once | INTRAVENOUS | Status: AC
  Administered 2019-11-05: 250 mL via INTRAVENOUS
  Filled 2019-11-05: qty 250

## 2019-11-05 MED ORDER — DIPHENHYDRAMINE HCL 25 MG PO CAPS
ORAL_CAPSULE | ORAL | Status: AC
  Filled 2019-11-05: qty 1

## 2019-11-05 MED ORDER — DIPHENHYDRAMINE HCL 25 MG PO CAPS
25.0000 mg | ORAL_CAPSULE | Freq: Once | ORAL | Status: AC
  Administered 2019-11-05: 25 mg via ORAL

## 2019-11-05 MED ORDER — ACETAMINOPHEN 325 MG PO TABS
650.0000 mg | ORAL_TABLET | Freq: Once | ORAL | Status: AC
  Administered 2019-11-05: 650 mg via ORAL

## 2019-11-05 NOTE — Patient Instructions (Signed)
Blood Transfusion, Adult A blood transfusion is a procedure in which you receive blood or a type of blood cell (blood component) through an IV. You may need a blood transfusion when your blood level is low. This may result from a bleeding disorder, illness, injury, or surgery. The blood may come from a donor. You may also be able to donate blood for yourself (autologous blood donation) before a planned surgery. The blood given in a transfusion is made up of different blood components. You may receive:  Red blood cells. These carry oxygen to the cells in the body.  Platelets. These help your blood to clot.  Plasma. This is the liquid part of your blood. It carries proteins and other substances throughout the body.  White blood cells. These help you fight infections. If you have hemophilia or another clotting disorder, you may also receive other types of blood products. Tell a health care provider about:  Any blood disorders you have.  Any previous reactions you have had during a blood transfusion.  Any allergies you have.  All medicines you are taking, including vitamins, herbs, eye drops, creams, and over-the-counter medicines.  Any surgeries you have had.  Any medical conditions you have, including any recent fever or cold symptoms.  Whether you are pregnant or may be pregnant. What are the risks? Generally, this is a safe procedure. However, problems may occur.  The most common problems include: ? A mild allergic reaction, such as red, swollen areas of skin (hives) and itching. ? Fever or chills. This may be the body's response to new blood cells received. This may occur during or up to 4 hours after the transfusion.  More serious problems may include: ? Transfusion-associated circulatory overload (TACO), or too much fluid in the lungs. This may cause breathing problems. ? A serious allergic reaction, such as difficulty breathing or swelling around the face and  lips. ? Transfusion-related acute lung injury (TRALI), which causes breathing difficulty and low oxygen in the blood. This can occur within hours of the transfusion or several days later. ? Iron overload. This can happen after receiving many blood transfusions over a period of time. ? Infection or virus being transmitted. This is rare because donated blood is carefully tested before it is given. ? Hemolytic transfusion reaction. This is rare. It happens when your body's defense system (immune system)tries to attack the new blood cells. Symptoms may include fever, chills, nausea, low blood pressure, and low back or chest pain. ? Transfusion-associated graft-versus-host disease (TAGVHD). This is rare. It happens when donated cells attack your body's healthy tissues. What happens before the procedure? Medicines Ask your health care provider about:  Changing or stopping your regular medicines. This is especially important if you are taking diabetes medicines or blood thinners.  Taking medicines such as aspirin and ibuprofen. These medicines can thin your blood. Do not take these medicines unless your health care provider tells you to take them.  Taking over-the-counter medicines, vitamins, herbs, and supplements. General instructions  Follow instructions from your health care provider about eating and drinking restrictions.  You will have a blood test to determine your blood type. This is necessary to know what kind of blood your body will accept and to match it to the donor blood.  If you are going to have a planned surgery, you may be able to do an autologous blood donation. This may be done in case you need to have a transfusion.  You will have your temperature,  blood pressure, and pulse monitored before the transfusion.  If you have had an allergic reaction to a transfusion in the past, you may be given medicine to help prevent a reaction. This medicine may be given to you by mouth (orally)  or through an IV.  Set aside time for the blood transfusion. This procedure generally takes 1-4 hours to complete. What happens during the procedure?   An IV will be inserted into one of your veins.  The bag of donated blood will be attached to your IV. The blood will then enter through your vein.  Your temperature, blood pressure, and pulse will be monitored regularly during the transfusion. This monitoring is done to detect early signs of a transfusion reaction.  Tell your nurse right away if you have any of these symptoms during the transfusion: ? Shortness of breath or trouble breathing. ? Chest or back pain. ? Fever or chills. ? Hives or itching.  If you have any signs or symptoms of a reaction, your transfusion will be stopped and you may be given medicine.  When the transfusion is complete, your IV will be removed.  Pressure may be applied to the IV site for a few minutes.  A bandage (dressing)will be applied. The procedure may vary among health care providers and hospitals. What happens after the procedure?  Your temperature, blood pressure, pulse, breathing rate, and blood oxygen level will be monitored until you leave the hospital or clinic.  Your blood may be tested to see how you are responding to the transfusion.  You may be warmed with fluids or blankets to maintain a normal body temperature.  If you receive your blood transfusion in an outpatient setting, you will be told whom to contact to report any reactions. Where to find more information For more information on blood transfusions, visit the American Red [employer]: redcross.org Summary  A blood transfusion is a procedure in which you receive blood or a type of blood cell (blood component) through an IV.  The blood you receive may come from a donor or be donated by yourself (autologous blood donation) before a planned surgery.  The blood given in a transfusion is made up of different blood components. You may  receive red blood cells, platelets, plasma, or white blood cells depending on the condition treated.  Your temperature, blood pressure, and pulse will be monitored before, during, and after the transfusion.  After the transfusion, your blood may be tested to see how your body has responded. This information is not intended to replace advice given to you by your health care provider. Make sure you discuss any questions you have with your health care provider. Document Revised: 12/04/2018 Document Reviewed: 12/04/2018 Elsevier Patient Education  [place].     Blood Transfusion, Adult, Care After This sheet gives you information about how to care for yourself after your procedure. Your doctor may also give you more specific instructions. If you have problems or questions, contact your doctor. What can I expect after the procedure? After the procedure, it is common to have:  Bruising and soreness at the IV site.  A fever or chills on the day of the procedure. This may be your body's response to the new blood cells received.  A headache. Follow these instructions at home: Insertion site care      Follow instructions from your doctor about how to take care of your insertion site. This is where an IV tube was put into your vein.  Make sure you: ? Wash your hands with soap and water before and after you change your bandage (dressing). If you cannot use soap and water, use hand sanitizer. ? Change your bandage as told by your doctor.  Check your insertion site every day for signs of infection. Check for: ? Redness, swelling, or pain. ? Bleeding from the site. ? Warmth. ? Pus or a bad smell. General instructions  Take over-the-counter and prescription medicines only as told by your doctor.  Rest as told by your doctor.  Go back to your normal activities as told by your doctor.  Keep all follow-up visits as told by your doctor. This is important. Contact a doctor  if:  You have itching or red, swollen areas of skin (hives).  You feel worried or nervous (anxious).  You feel weak after doing your normal activities.  You have redness, swelling, warmth, or pain around the insertion site.  You have blood coming from the insertion site, and the blood does not stop with pressure.  You have pus or a bad smell coming from the insertion site. Get help right away if:  You have signs of a serious reaction. This may be coming from an allergy or the body's defense system (immune system). Signs include: ? Trouble breathing or shortness of breath. ? Swelling of the face or feeling warm (flushed). ? Fever or chills. ? Head, chest, or back pain. ? Dark pee (urine) or blood in the pee. ? Widespread rash. ? Fast heartbeat. ? Feeling dizzy or light-headed. You may receive your blood transfusion in an outpatient setting. If so, you will be told whom to contact to report any reactions. These symptoms may be an emergency. Do not wait to see if the symptoms will go away. Get medical help right away. Call your local emergency services (911 in the [place] Do not drive yourself to the hospital. Summary  Bruising and soreness at the IV site are common.  Check your insertion site every day for signs of infection.  Rest as told by your doctor. Go back to your normal activities as told by your doctor.  Get help right away if you have signs of a serious reaction. This information is not intended to replace advice given to you by your health care provider. Make sure you discuss any questions you have with your health care provider. Document Revised: 12/04/2018 Document Reviewed: 12/04/2018 Elsevier Patient Education  [place].

## 2019-11-05 NOTE — Progress Notes (Signed)
Pt agreed to 1 unit PRBCS today, MD [facility] aware and ok to give 1 unit.  Pt received 1 unit PRBCs, tolerated well.  VSS.  Able to eat/drink/ambulate to restroom during tx w/out any issues.

## 2019-11-05 NOTE — Progress Notes (Signed)
  Radiation Oncology         [phone] ________________________________  Name: Mario Harmon MRN: [id number]  Date: 08/19/2019  DOB: 1946-02-08  End of Treatment Note  Diagnosis:   mesothelioma     Indication for treatment::  palliative       Radiation treatment dates:   07/20/2019 through 08/19/2019  Site/dose:   The patient was treated to the right lung to a dose of 37.5 Gray in 15 fractions at 2.5 Pearline Cables per fraction.  This was accomplished using a 4 field 3D conformal technique.  Narrative: The patient tolerated radiation treatment relatively well.     Plan: The patient has completed radiation treatment. The patient will return to radiation oncology clinic for routine followup in one month. I advised the patient to call or return sooner if they have any questions or concerns related to their recovery or treatment. ________________________________  Jodelle Gross, M.D., Ph.D.

## 2019-11-06 ENCOUNTER — Ambulatory Visit (HOSPITAL_COMMUNITY)
Admission: RE | Admit: 2019-11-06 | Discharge: 2019-11-06 | Disposition: A | Discharge status: Home / Self Care | Payer: Medicare Other | Source: Ambulatory Visit | Attending: Internal Medicine | Admitting: Internal Medicine

## 2019-11-06 ENCOUNTER — Encounter (HOSPITAL_COMMUNITY): Payer: Self-pay

## 2019-11-06 ENCOUNTER — Other Ambulatory Visit: Payer: Self-pay | Admitting: Internal Medicine

## 2019-11-06 DIAGNOSIS — C450 Mesothelioma of pleura: Secondary | ICD-10-CM

## 2019-11-06 DIAGNOSIS — Z79899 Other long term (current) drug therapy: Secondary | ICD-10-CM | POA: Diagnosis not present

## 2019-11-06 HISTORY — PX: IR IMAGING GUIDED PORT INSERTION: IMG5740

## 2019-11-06 LAB — CBC WITH DIFFERENTIAL/PLATELET
Abs Immature Granulocytes: 0.04 10*3/uL (ref 0.00–0.07)
Basophils Absolute: 0 10*3/uL (ref 0.0–0.1)
Basophils Relative: 0 %
Eosinophils Absolute: 0 10*3/uL (ref 0.0–0.5)
Eosinophils Relative: 1 %
HCT: 25.9 % — ABNORMAL LOW (ref 39.0–52.0)
Hemoglobin: 8.3 g/dL — ABNORMAL LOW (ref 13.0–17.0)
Immature Granulocytes: 1 %
Lymphocytes Relative: 4 %
Lymphs Abs: 0.2 10*3/uL — ABNORMAL LOW (ref 0.7–4.0)
MCH: 31 pg (ref 26.0–34.0)
MCHC: 32 g/dL (ref 30.0–36.0)
MCV: 96.6 fL (ref 80.0–100.0)
Monocytes Absolute: 1.1 10*3/uL — ABNORMAL HIGH (ref 0.1–1.0)
Monocytes Relative: 19 %
Neutro Abs: 4.4 10*3/uL (ref 1.7–7.7)
Neutrophils Relative %: 75 %
Platelets: 147 10*3/uL — ABNORMAL LOW (ref 150–400)
RBC: 2.68 MIL/uL — ABNORMAL LOW (ref 4.22–5.81)
RDW: 18.9 % — ABNORMAL HIGH (ref 11.5–15.5)
WBC: 5.8 10*3/uL (ref 4.0–10.5)
nRBC: 0 % (ref 0.0–0.2)

## 2019-11-06 LAB — TYPE AND SCREEN
ABO/RH(D): A POS
Antibody Screen: NEGATIVE
Unit division: 0

## 2019-11-06 LAB — BPAM RBC
Blood Product Expiration Date: 202106032359
ISSUE DATE / TIME: 202105131217
Unit Type and Rh: 6200

## 2019-11-06 LAB — PROTIME-INR
INR: 1.1 (ref 0.8–1.2)
Prothrombin Time: 13.6 seconds (ref 11.4–15.2)

## 2019-11-06 MED ORDER — HEPARIN SOD (PORK) LOCK FLUSH 100 UNIT/ML IV SOLN
INTRAVENOUS | Status: AC | PRN
  Administered 2019-11-06: 500 [IU] via INTRAVENOUS

## 2019-11-06 MED ORDER — CEFAZOLIN SODIUM-DEXTROSE 2-4 GM/100ML-% IV SOLN: 2.0000 g | Freq: Once | INTRAVENOUS | Status: AC

## 2019-11-06 MED ORDER — LIDOCAINE-EPINEPHRINE 1 %-1:100000 IJ SOLN
INTRAMUSCULAR | Status: AC | PRN
  Administered 2019-11-06 (×2): 10 mL via INTRADERMAL

## 2019-11-06 MED ORDER — FENTANYL CITRATE (PF) 100 MCG/2ML IJ SOLN
INTRAMUSCULAR | Status: AC | PRN
  Administered 2019-11-06 (×2): 50 ug via INTRAVENOUS

## 2019-11-06 MED ORDER — MIDAZOLAM HCL 2 MG/2ML IJ SOLN
INTRAMUSCULAR | Status: AC | PRN
  Administered 2019-11-06 (×2): 1 mg via INTRAVENOUS
  Administered 2019-11-06: 0.5 mg via INTRAVENOUS

## 2019-11-06 MED ORDER — HEPARIN SOD (PORK) LOCK FLUSH 100 UNIT/ML IV SOLN
INTRAVENOUS | Status: AC
  Filled 2019-11-06: qty 5

## 2019-11-06 MED ORDER — MIDAZOLAM HCL 2 MG/2ML IJ SOLN
INTRAMUSCULAR | Status: AC
  Filled 2019-11-06: qty 4

## 2019-11-06 MED ORDER — SODIUM CHLORIDE 0.9 % IV SOLN: INTRAVENOUS | Status: DC

## 2019-11-06 MED ORDER — FENTANYL CITRATE (PF) 100 MCG/2ML IJ SOLN
INTRAMUSCULAR | Status: AC
  Filled 2019-11-06: qty 4

## 2019-11-06 MED ORDER — CEFAZOLIN SODIUM-DEXTROSE 2-4 GM/100ML-% IV SOLN
INTRAVENOUS | Status: AC
  Administered 2019-11-06: 2 g via INTRAVENOUS
  Filled 2019-11-06: qty 100

## 2019-11-06 MED ORDER — LIDOCAINE-EPINEPHRINE 1 %-1:100000 IJ SOLN
INTRAMUSCULAR | Status: AC
  Filled 2019-11-06: qty 1

## 2019-11-06 NOTE — H&P (Signed)
Referring Physician(s): Mohamed,Mohamed  Supervising Physician: Sandi Mariscal  Patient Status:  [facility] OP  Chief Complaint: "I'm having a port put in"   Subjective: Patient familiar to IR service from right thoracentesis on 07/03/2019 and right pleural mass biopsy on 07/14/2019.  He has a history of newly diagnosed malignant pleural mesothelioma and poor venous access.  He presents today for Port-A-Cath placement for chemotherapy.  He currently denies fever, headache, worsening dyspnea, cough, or abnormal bleeding.  He does have right chest discomfort along with back and sacral pain as well as some intermittent nausea /vomiting.  He is also anxious.  Additional history as below.  Past Medical History:  Diagnosis Date  . Mesothelioma [facility])    Past Surgical History:  Procedure Laterality Date  . WRIST FRACTURE SURGERY Left       Allergies: Patient has no known allergies.  Medications: Prior to Admission medications   Medication Sig Start Date End Date Taking? Authorizing Provider  bisacodyl (DULCOLAX) 5 MG EC tablet Take 5 mg by mouth daily as needed for moderate constipation.    [provider]  dexamethasone (DECADRON) 4 MG tablet 1 tablet p.o. twice daily the day before, day of and day after chemotherapy every 3 weeks. 07/21/19   Curt Bears, MD  finasteride (PROSCAR) 5 MG tablet Take 5 mg by mouth daily. 10/14/19   [provider]  folic acid (FOLVITE) 1 MG tablet Take 1 tablet (1 mg total) by mouth daily. 07/21/19   Curt Bears, MD  morphine (MS CONTIN) 60 MG 12 hr tablet Take 1 tablet (60 mg total) by mouth every 12 (twelve) hours. 10/30/19   Curt Bears, MD  OVER THE COUNTER MEDICATION Take 1 tablet by mouth 2 (two) times daily. Prostate health supplement    [provider]  oxyCODONE (OXY IR/ROXICODONE) 5 MG immediate release tablet Take 1 tablet (5 mg total) by mouth every 6 (six) hours as needed for severe pain. 09/10/19   Curt Bears, MD  pantoprazole (PROTONIX) 40 MG tablet TAKE 1 TABLET BY MOUTH PRIOR TO BREAKFAST 09/16/19   Hayden Pedro, PA-C  polycarbophil (FIBERCON) 625 MG tablet Take 625 mg by mouth daily.    [provider]  prochlorperazine (COMPAZINE) 10 MG tablet Take 1 tablet (10 mg total) by mouth every 6 (six) hours as needed for nausea or vomiting. Patient not taking: Reported on 10/26/2019 08/14/19   Heilingoetter, Cassandra L, PA-C  tamsulosin (FLOMAX) 0.4 MG CAPS capsule Take 1 capsule (0.4 mg total) by mouth daily. 09/15/19   Quintella Reichert, MD     Vital Signs: Blood pressure 143/90, temp 98, heart rate 103, respirations 20, O2 sat 96% room air   Physical Exam awake, alert.  Chest with dim breath sounds on right, left clear.  Heart with slightly tachycardic but regular rhythm.  Abdomen soft, positive bowel sounds, nontender.  No significant pretibial edema.  Imaging: No results found.  Labs:  CBC: Recent Labs    10/19/19 1205 10/26/19 1145 11/02/19 1141 11/05/19 1008  WBC 4.2 8.5 2.7* 4.9  HGB 8.0* 8.2* 7.8* 7.8*  HCT 25.3* 26.3* 24.9* 24.7*  PLT 135* 441* 282 174    COAGS: Recent Labs    07/14/19 0942  INR 1.1    BMP: Recent Labs    10/12/19 1207 10/19/19 1205 10/26/19 1145 11/02/19 1141  NA 138 138 141 140  K 4.3 4.3 3.8 4.2  CL 100 100 100 99  CO2 28 29 30  28  GLUCOSE 102* 123* 110* 115*  BUN 12 21 17 13   CALCIUM 9.1 9.4 9.7 9.5  CREATININE 0.81 0.84 0.86 0.83  GFRNONAA >60 >60 >60 >60  GFRAA >60 >60 >60 >60    LIVER FUNCTION TESTS: Recent Labs    10/12/19 1207 10/19/19 1205 10/26/19 1145 11/02/19 1141  BILITOT 0.3 0.3 <0.2* 0.4  AST 18 21 21 21   ALT 22 22 37 27  ALKPHOS 72 78 91 80  PROT 6.3* 6.4* 6.7 6.6  ALBUMIN 2.7* 2.6* 2.6* 2.7*    Assessment and Plan: 74 yo male with history of newly diagnosed malignant pleural mesothelioma and poor venous access.  He presents today for Port-A-Cath placement for chemotherapy.Risks and  benefits of image guided port-a-catheter placement was discussed with the patient including, but not limited to bleeding, infection, pneumothorax, or fibrin sheath development and need for additional procedures.  All of the patient's questions were answered, patient is agreeable to proceed. Consent signed and in chart.  LABS PENDING   Electronically Signed: D. Rowe Robert, PA-C 11/06/2019, 12:52 PM   I spent a total of 30 minutes at the the patient's bedside AND on the patient's hospital floor or unit, greater than 50% of which was counseling/coordinating care for Port-A-Cath placement

## 2019-11-06 NOTE — Discharge Instructions (Addendum)
Do not use lidocaine cream on your new port until the [place] tell you it has healed. The petroleum in the lidocaine cream will dissolve the skin glue. The skin will come apart. You will get an infection over your new port. Use ice in a zip lock bag over your new port for 2- 3 minutes before the nurses access your new port.   Implanted Port Insertion, Care After This sheet gives you information about how to care for yourself after your procedure. Your health care provider may also give you more specific instructions. If you have problems or questions, contact your health care provider. What can I expect after the procedure? After the procedure, it is common to have:  Discomfort at the port insertion site.  Bruising on the skin over the port. This should improve over 3-4 days. Follow these instructions at home: [facility] care  After your port is placed, you will get a manufacturer's information card. The card has information about your port. Keep this card with you at all times.  Take care of the port as told by your health care provider. Ask your health care provider if you or a family member can get training for taking care of the port at home. A home health care nurse may also take care of the port.  Make sure to remember what type of port you have. Incision care      Follow instructions from your health care provider about how to take care of your port insertion site. Make sure you: ? Wash your hands with soap and water before and after you change your bandage (dressing). If soap and water are not available, use hand sanitizer. ? Change your dressing as told by your health care provider. ? Leave skin glue in place. These skin closures may need to stay in place for 2 weeks or longer.  Check your port insertion site every day for signs of infection. Check for: ? Redness, swelling, or pain. ? Fluid or blood. ? Warmth. ? Pus or a bad smell. Activity  Return to your normal  activities as told by your health care provider. Ask your health care provider what activities are safe for you.  Do not lift anything that is heavier than 10 lb (4.5 kg), or the limit that you are told, until your health care provider says that it is safe. General instructions  Take over-the-counter and prescription medicines only as told by your health care provider.  Do not take baths, swim, or use a hot tub until your health care provider approves. You may shower tomorrow.  Do not drive for 24 hours if you were given a sedative during your procedure.  Wear a medical alert bracelet in case of an emergency. This will tell any health care providers that you have a port.  Keep all follow-up visits as told by your health care provider. This is important. Contact a health care provider if:  You cannot flush your port with saline as directed, or you cannot draw blood from the port.  You have a fever or chills.  You have redness, swelling, or pain around your port insertion site.  You have fluid or blood coming from your port insertion site.  Your port insertion site feels warm to the touch.  You have pus or a bad smell coming from the port insertion site. Get help right away if:  You have chest pain or shortness of breath.  You have bleeding from your port  that you cannot control. Summary  Take care of the port as told by your health care provider. Keep the manufacturer's information card with you at all times.  Change your dressing as told by your health care provider.  Contact a health care provider if you have a fever or chills or if you have redness, swelling, or pain around your port insertion site.  Keep all follow-up visits as told by your health care provider. This information is not intended to replace advice given to you by your health care provider. Make sure you discuss any questions you have with your health care provider. Document Revised: 01/07/2018 Document  Reviewed: 01/07/2018 Elsevier Patient Education  [place].     Moderate Conscious Sedation, Adult, Care After These instructions provide you with information about caring for yourself after your procedure. Your health care provider may also give you more specific instructions. Your treatment has been planned according to current medical practices, but problems sometimes occur. Call your health care provider if you have any problems or questions after your procedure. What can I expect after the procedure? After your procedure, it is common:  To feel sleepy for several hours.  To feel clumsy and have poor balance for several hours.  To have poor judgment for several hours.  To vomit if you eat too soon. Follow these instructions at home: For at least 24 hours after the procedure:   Do not: ? Participate in activities where you could fall or become injured. ? Drive. ? Use heavy machinery. ? Drink alcohol. ? Take sleeping pills or medicines that cause drowsiness. ? Make important decisions or sign legal documents. ? Take care of children on your own.  Rest. Eating and drinking  Follow the diet recommended by your health care provider.  If you vomit: ? Drink water, juice, or soup when you can drink without vomiting. ? Make sure you have little or no nausea before eating solid foods. General instructions  Have a responsible adult stay with you until you are awake and alert.  Take over-the-counter and prescription medicines only as told by your health care provider.  If you smoke, do not smoke without supervision.  Keep all follow-up visits as told by your health care provider. This is important. Contact a health care provider if:  You keep feeling nauseous or you keep vomiting.  You feel light-headed.  You develop a rash.  You have a fever. Get help right away if:  You have trouble breathing. This information is not intended to replace advice given to  you by your health care provider. Make sure you discuss any questions you have with your health care provider. Document Revised: 05/24/2017 Document Reviewed: 10/01/2015 Elsevier Patient Education  2020 [employer].

## 2019-11-06 NOTE — Procedures (Signed)
Pre Procedure Dx: Poor venous access Post Procedural Dx: Same  Successful placement of left IJ approach port-a-cath with tip at the superior caval atrial junction. The catheter is ready for immediate use.  Estimated Blood Loss: Minimal  Complications: None immediate.  Ronny Bacon, MD Pager #: [phone]

## 2019-11-09 ENCOUNTER — Other Ambulatory Visit: Payer: Self-pay

## 2019-11-09 ENCOUNTER — Inpatient Hospital Stay: Payer: Medicare Other

## 2019-11-09 ENCOUNTER — Other Ambulatory Visit (HOSPITAL_COMMUNITY): Payer: Medicare Other

## 2019-11-09 DIAGNOSIS — Z5112 Encounter for antineoplastic immunotherapy: Secondary | ICD-10-CM | POA: Diagnosis not present

## 2019-11-09 DIAGNOSIS — C450 Mesothelioma of pleura: Secondary | ICD-10-CM

## 2019-11-09 DIAGNOSIS — Z862 Personal history of diseases of the blood and blood-forming organs and certain disorders involving the immune mechanism: Secondary | ICD-10-CM

## 2019-11-09 LAB — CBC WITH DIFFERENTIAL (CANCER CENTER ONLY)
Abs Immature Granulocytes: 0.02 10*3/uL (ref 0.00–0.07)
Basophils Absolute: 0 10*3/uL (ref 0.0–0.1)
Basophils Relative: 0 %
Eosinophils Absolute: 0.1 10*3/uL (ref 0.0–0.5)
Eosinophils Relative: 2 %
HCT: 26.9 % — ABNORMAL LOW (ref 39.0–52.0)
Hemoglobin: 8.4 g/dL — ABNORMAL LOW (ref 13.0–17.0)
Immature Granulocytes: 0 %
Lymphocytes Relative: 3 %
Lymphs Abs: 0.2 10*3/uL — ABNORMAL LOW (ref 0.7–4.0)
MCH: 30 pg (ref 26.0–34.0)
MCHC: 31.2 g/dL (ref 30.0–36.0)
MCV: 96.1 fL (ref 80.0–100.0)
Monocytes Absolute: 1 10*3/uL (ref 0.1–1.0)
Monocytes Relative: 17 %
Neutro Abs: 4.5 10*3/uL (ref 1.7–7.7)
Neutrophils Relative %: 78 %
Platelet Count: 126 10*3/uL — ABNORMAL LOW (ref 150–400)
RBC: 2.8 MIL/uL — ABNORMAL LOW (ref 4.22–5.81)
RDW: 17.9 % — ABNORMAL HIGH (ref 11.5–15.5)
WBC Count: 5.8 10*3/uL (ref 4.0–10.5)
nRBC: 0 % (ref 0.0–0.2)

## 2019-11-09 LAB — CMP (CANCER CENTER ONLY)
ALT: 17 U/L (ref 0–44)
AST: 18 U/L (ref 15–41)
Albumin: 2.5 g/dL — ABNORMAL LOW (ref 3.5–5.0)
Alkaline Phosphatase: 98 U/L (ref 38–126)
Anion gap: 11 (ref 5–15)
BUN: 13 mg/dL (ref 8–23)
CO2: 28 mmol/L (ref 22–32)
Calcium: 9.3 mg/dL (ref 8.9–10.3)
Chloride: 101 mmol/L (ref 98–111)
Creatinine: 0.82 mg/dL (ref 0.61–1.24)
GFR, Est AFR Am: >60 mL/min (ref >60)
GFR, Estimated: >60 mL/min (ref >60)
Glucose, Bld: 122 mg/dL — ABNORMAL HIGH (ref 70–99)
Potassium: 3.9 mmol/L (ref 3.5–5.1)
Sodium: 140 mmol/L (ref 135–145)
Total Bilirubin: 0.3 mg/dL (ref 0.3–1.2)
Total Protein: 6.4 g/dL — ABNORMAL LOW (ref 6.5–8.1)

## 2019-11-09 LAB — MAGNESIUM: Magnesium: 1.6 mg/dL — ABNORMAL LOW (ref 1.7–2.4)

## 2019-11-09 MED ORDER — SODIUM CHLORIDE 0.9% FLUSH
10.0000 mL | Freq: Once | INTRAVENOUS | Status: AC | PRN
  Administered 2019-11-09: 10 mL
  Filled 2019-11-09: qty 10

## 2019-11-09 MED ORDER — HEPARIN SOD (PORK) LOCK FLUSH 100 UNIT/ML IV SOLN
500.0000 [IU] | Freq: Once | INTRAVENOUS | Status: AC | PRN
  Administered 2019-11-09: 500 [IU]
  Filled 2019-11-09: qty 5

## 2019-11-10 ENCOUNTER — Telehealth: Payer: Self-pay | Admitting: Medical Oncology

## 2019-11-10 ENCOUNTER — Other Ambulatory Visit: Payer: Self-pay | Admitting: Internal Medicine

## 2019-11-10 MED ORDER — OXYCODONE HCL 5 MG PO TABS: 5.0000 mg | ORAL_TABLET | Freq: Four times a day (QID) | ORAL | 0 refills | Status: DC | PRN

## 2019-11-10 NOTE — Telephone Encounter (Signed)
Pt requested refill for oxyir.

## 2019-11-10 NOTE — Progress Notes (Signed)
Pharmacist Chemotherapy Monitoring - Follow Up Assessment    I verify that I have reviewed each item in the below checklist:  . Regimen for the patient is scheduled for the appropriate day and plan matches scheduled date. Marland Kitchen Appropriate non-routine labs are ordered dependent on drug ordered. . If applicable, additional medications reviewed and ordered per protocol based on lifetime cumulative doses and/or treatment regimen.   Plan for follow-up and/or issues identified: No . I-vent associated with next due treatment: No . MD and/or nursing notified: No  Sharyl Panchal D 11/10/2019 10:20 AM

## 2019-11-12 ENCOUNTER — Other Ambulatory Visit: Payer: Self-pay | Admitting: Medical Oncology

## 2019-11-12 ENCOUNTER — Telehealth: Payer: Self-pay | Admitting: Medical Oncology

## 2019-11-12 DIAGNOSIS — Z95828 Presence of other vascular implants and grafts: Secondary | ICD-10-CM

## 2019-11-12 MED ORDER — LIDOCAINE-PRILOCAINE 2.5-2.5 % EX CREA: 1.0000 "application " | TOPICAL_CREAM | CUTANEOUS | 0 refills | Status: DC | PRN

## 2019-11-12 MED ORDER — LIDOCAINE-PRILOCAINE 2.5-2.5 % EX CREA
1.0000 | TOPICAL_CREAM | CUTANEOUS | 0 refills | Status: AC | PRN
Start: 2019-11-12 — End: ?

## 2019-11-12 NOTE — Addendum Note (Signed)
Addended by: Ardeen Garland on: 11/12/2019 10:18 AM   Modules accepted: Orders

## 2019-11-12 NOTE — Telephone Encounter (Signed)
I told pt he can proceed with COVID vaccine tomorrow.

## 2019-11-13 ENCOUNTER — Other Ambulatory Visit: Payer: Medicare Other

## 2019-11-13 ENCOUNTER — Ambulatory Visit: Payer: Medicare Other | Admitting: Physician Assistant

## 2019-11-13 ENCOUNTER — Ambulatory Visit: Payer: Medicare Other

## 2019-11-14 ENCOUNTER — Other Ambulatory Visit: Payer: Self-pay | Admitting: Internal Medicine

## 2019-11-15 ENCOUNTER — Other Ambulatory Visit: Payer: Self-pay | Admitting: Physician Assistant

## 2019-11-15 DIAGNOSIS — R112 Nausea with vomiting, unspecified: Secondary | ICD-10-CM

## 2019-11-16 ENCOUNTER — Inpatient Hospital Stay: Payer: Medicare Other

## 2019-11-16 ENCOUNTER — Other Ambulatory Visit: Payer: Self-pay

## 2019-11-16 ENCOUNTER — Other Ambulatory Visit: Payer: Self-pay | Admitting: Internal Medicine

## 2019-11-16 ENCOUNTER — Inpatient Hospital Stay (HOSPITAL_BASED_OUTPATIENT_CLINIC_OR_DEPARTMENT_OTHER): Payer: Medicare Other | Admitting: Internal Medicine

## 2019-11-16 ENCOUNTER — Encounter: Payer: Self-pay | Admitting: Internal Medicine

## 2019-11-16 VITALS — BP 131/76 | HR 100

## 2019-11-16 VITALS — BP 152/88 | HR 107 | Temp 97.9°F | Resp 20 | Ht 68.0 in | Wt 132.7 lb

## 2019-11-16 DIAGNOSIS — G893 Neoplasm related pain (acute) (chronic): Secondary | ICD-10-CM

## 2019-11-16 DIAGNOSIS — C782 Secondary malignant neoplasm of pleura: Secondary | ICD-10-CM

## 2019-11-16 DIAGNOSIS — C450 Mesothelioma of pleura: Secondary | ICD-10-CM

## 2019-11-16 DIAGNOSIS — Z5112 Encounter for antineoplastic immunotherapy: Secondary | ICD-10-CM | POA: Diagnosis not present

## 2019-11-16 DIAGNOSIS — C7951 Secondary malignant neoplasm of bone: Secondary | ICD-10-CM

## 2019-11-16 DIAGNOSIS — Z5111 Encounter for antineoplastic chemotherapy: Secondary | ICD-10-CM

## 2019-11-16 DIAGNOSIS — Z862 Personal history of diseases of the blood and blood-forming organs and certain disorders involving the immune mechanism: Secondary | ICD-10-CM

## 2019-11-16 LAB — CMP (CANCER CENTER ONLY)
ALT: 24 U/L (ref 0–44)
AST: 19 U/L (ref 15–41)
Albumin: 2.5 g/dL — ABNORMAL LOW (ref 3.5–5.0)
Alkaline Phosphatase: 111 U/L (ref 38–126)
Anion gap: 10 (ref 5–15)
BUN: 15 mg/dL (ref 8–23)
CO2: 31 mmol/L (ref 22–32)
Calcium: 9.7 mg/dL (ref 8.9–10.3)
Chloride: 101 mmol/L (ref 98–111)
Creatinine: 0.88 mg/dL (ref 0.61–1.24)
GFR, Est AFR Am: >60 mL/min (ref >60)
GFR, Estimated: >60 mL/min (ref >60)
Glucose, Bld: 118 mg/dL — ABNORMAL HIGH (ref 70–99)
Potassium: 4.4 mmol/L (ref 3.5–5.1)
Sodium: 142 mmol/L (ref 135–145)
Total Bilirubin: <0.2 mg/dL — ABNORMAL LOW (ref 0.3–1.2)
Total Protein: 6.5 g/dL (ref 6.5–8.1)

## 2019-11-16 LAB — CBC WITH DIFFERENTIAL (CANCER CENTER ONLY)
Abs Immature Granulocytes: 0.09 10*3/uL — ABNORMAL HIGH (ref 0.00–0.07)
Basophils Absolute: 0 10*3/uL (ref 0.0–0.1)
Basophils Relative: 0 %
Eosinophils Absolute: 0 10*3/uL (ref 0.0–0.5)
Eosinophils Relative: 0 %
HCT: 26.2 % — ABNORMAL LOW (ref 39.0–52.0)
Hemoglobin: 8.2 g/dL — ABNORMAL LOW (ref 13.0–17.0)
Immature Granulocytes: 1 %
Lymphocytes Relative: 2 %
Lymphs Abs: 0.2 10*3/uL — ABNORMAL LOW (ref 0.7–4.0)
MCH: 29.8 pg (ref 26.0–34.0)
MCHC: 31.3 g/dL (ref 30.0–36.0)
MCV: 95.3 fL (ref 80.0–100.0)
Monocytes Absolute: 0.7 10*3/uL (ref 0.1–1.0)
Monocytes Relative: 9 %
Neutro Abs: 7.1 10*3/uL (ref 1.7–7.7)
Neutrophils Relative %: 88 %
Platelet Count: 251 10*3/uL (ref 150–400)
RBC: 2.75 MIL/uL — ABNORMAL LOW (ref 4.22–5.81)
RDW: 18 % — ABNORMAL HIGH (ref 11.5–15.5)
WBC Count: 8.1 10*3/uL (ref 4.0–10.5)
nRBC: 0 % (ref 0.0–0.2)

## 2019-11-16 LAB — MAGNESIUM: Magnesium: 1.7 mg/dL (ref 1.7–2.4)

## 2019-11-16 LAB — TOTAL PROTEIN, URINE DIPSTICK: Protein, ur: NEGATIVE mg/dL

## 2019-11-16 MED ORDER — SODIUM CHLORIDE 0.9% FLUSH
10.0000 mL | Freq: Once | INTRAVENOUS | Status: AC | PRN
  Administered 2019-11-16: 10 mL
  Filled 2019-11-16: qty 10

## 2019-11-16 MED ORDER — PALONOSETRON HCL INJECTION 0.25 MG/5ML
INTRAVENOUS | Status: AC
  Filled 2019-11-16: qty 5

## 2019-11-16 MED ORDER — PALONOSETRON HCL INJECTION 0.25 MG/5ML
0.2500 mg | Freq: Once | INTRAVENOUS | Status: AC
  Administered 2019-11-16: 0.25 mg via INTRAVENOUS

## 2019-11-16 MED ORDER — SODIUM CHLORIDE 0.9 % IV SOLN
15.0000 mg/kg | Freq: Once | INTRAVENOUS | Status: AC
  Administered 2019-11-16: 900 mg via INTRAVENOUS
  Filled 2019-11-16: qty 4

## 2019-11-16 MED ORDER — SODIUM CHLORIDE 0.9 % IV SOLN
Freq: Once | INTRAVENOUS | Status: AC
  Filled 2019-11-16: qty 250

## 2019-11-16 MED ORDER — SODIUM CHLORIDE 0.9 % IV SOLN
150.0000 mg | Freq: Once | INTRAVENOUS | Status: AC
  Administered 2019-11-16: 150 mg via INTRAVENOUS
  Filled 2019-11-16: qty 150

## 2019-11-16 MED ORDER — SODIUM CHLORIDE 0.9 % IV SOLN
348.4000 mg | Freq: Once | INTRAVENOUS | Status: AC
  Administered 2019-11-16: 350 mg via INTRAVENOUS
  Filled 2019-11-16: qty 35

## 2019-11-16 MED ORDER — SODIUM CHLORIDE 0.9% FLUSH
10.0000 mL | INTRAVENOUS | Status: DC | PRN
  Administered 2019-11-16: 10 mL
  Filled 2019-11-16: qty 10

## 2019-11-16 MED ORDER — SODIUM CHLORIDE 0.9 % IV SOLN
400.0000 mg/m2 | Freq: Once | INTRAVENOUS | Status: AC
  Administered 2019-11-16: 700 mg via INTRAVENOUS
  Filled 2019-11-16: qty 20

## 2019-11-16 MED ORDER — HEPARIN SOD (PORK) LOCK FLUSH 100 UNIT/ML IV SOLN
500.0000 [IU] | Freq: Once | INTRAVENOUS | Status: AC | PRN
  Administered 2019-11-16: 500 [IU]
  Filled 2019-11-16: qty 5

## 2019-11-16 MED ORDER — SODIUM CHLORIDE 0.9 % IV SOLN
10.0000 mg | Freq: Once | INTRAVENOUS | Status: AC
  Administered 2019-11-16: 10 mg via INTRAVENOUS
  Filled 2019-11-16: qty 10

## 2019-11-16 NOTE — Patient Instructions (Signed)

## 2019-11-16 NOTE — Progress Notes (Signed)
Ok to adj Zirabev dose today and use today's wt per Dr. Julien Nordmann. Dose going from 1000 mg --> 900 mg.  Kennith Center, Pharm.D., CPP 11/16/2019@3 :17 PM

## 2019-11-16 NOTE — Patient Instructions (Signed)
[facility] Discharge Instructions for Patients Receiving Chemotherapy  Today you received the following chemotherapy agents Alimta, Carboplatin and Avastin   To help prevent nausea and vomiting after your treatment, we encourage you to take your nausea medication as directed.    If you develop nausea and vomiting that is not controlled by your nausea medication, call the clinic.   BELOW ARE SYMPTOMS THAT SHOULD BE REPORTED IMMEDIATELY:  *FEVER GREATER THAN 100.5 F  *CHILLS WITH OR WITHOUT FEVER  NAUSEA AND VOMITING THAT IS NOT CONTROLLED WITH YOUR NAUSEA MEDICATION  *UNUSUAL SHORTNESS OF BREATH  *UNUSUAL BRUISING OR BLEEDING  TENDERNESS IN MOUTH AND THROAT WITH OR WITHOUT PRESENCE OF ULCERS  *URINARY PROBLEMS  *BOWEL PROBLEMS  UNUSUAL RASH Items with * indicate a potential emergency and should be followed up as soon as possible.  Feel free to call the clinic should you have any questions or concerns. The clinic phone number is [phone].  Please show the [facility] at check-in to the Emergency Department and triage nurse.

## 2019-11-16 NOTE — Progress Notes (Signed)
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  PRIOR THERAPY: Systemic chemotherapy with cisplatin 75 mg/M2, Alimta 500 mg/M2 and Avastin 15 mg/KG every 3 weeks.  First dose July 29, 2019.  Status post 1 cycle.  This was discontinued secondary to intolerance  CURRENT THERAPY: Systemic chemotherapy with carboplatin for AUC of 5, Alimta 500 mg/M2 and Avastin 15 mg/KG every 3 weeks.  First dose September 11, 2019.  Status post 3 cycles.  INTERVAL HISTORY: Mario Harmon 74 y.o. male returns to the clinic today for follow-up visit.  The patient continues to have pain on the right side of the chest.  He was referred to the pain clinic but did not accept the appointment that was given to him.  He will try to call them back for a different appointment.  He denied having any shortness of breath, cough or hemoptysis.  He denied having any fever or chills.  He has no nausea, vomiting, diarrhea or constipation.  He denied having any headache or visual changes.  He tolerated the last cycle of his treatment fairly well.  He is here today for evaluation before starting cycle #4.  MEDICAL HISTORY: Past Medical History:  Diagnosis Date  . Mesothelioma [facility])     ALLERGIES:  has No Known Allergies.  MEDICATIONS:  Current Outpatient Medications  Medication Sig Dispense Refill  . bisacodyl (DULCOLAX) 5 MG EC tablet Take 5 mg by mouth daily as needed for moderate constipation.    Marland Kitchen dexamethasone (DECADRON) 4 MG tablet 1 tablet p.o. twice daily the day before, day of and day after chemotherapy every 3 weeks. 40 tablet  1  . finasteride (PROSCAR) 5 MG tablet Take 5 mg by mouth daily.    . folic acid (FOLVITE) 1 MG tablet TAKE 1 TABLET BY MOUTH EVERY DAY 90 tablet 1  . lidocaine-prilocaine (EMLA) cream Apply 1 application topically as needed. Apply small amount to cotton ball and apply to skin over port site at lease 1 hour prior to chemotherapy. Do not rub in and cover plastic wrap. 30 g 0  . morphine (MS CONTIN) 60 MG 12 hr tablet Take 1 tablet (60 mg total) by mouth every 12 (twelve) hours. 60 tablet 0  . OVER THE COUNTER MEDICATION Take 1 tablet by mouth 2 (two) times daily. Prostate health supplement    . oxyCODONE (OXY IR/ROXICODONE) 5 MG immediate release tablet Take 1 tablet (5 mg total) by mouth every 6 (six) hours as needed for severe pain. 20 tablet 0  . pantoprazole (PROTONIX) 40 MG tablet TAKE 1 TABLET BY MOUTH PRIOR TO BREAKFAST 30 tablet 1  . polycarbophil (FIBERCON) 625 MG tablet Take 625 mg by mouth daily.    . prochlorperazine (COMPAZINE) 10 MG tablet TAKE 1 TABLET (10 MG TOTAL) BY MOUTH EVERY 6 (SIX) HOURS AS NEEDED FOR NAUSEA OR VOMITING. 30 tablet 2  . tamsulosin (FLOMAX) 0.4 MG CAPS capsule Take 1 capsule (0.4 mg total) by mouth daily. 30 capsule 0   No current facility-administered medications for this visit.    SURGICAL HISTORY:  Past Surgical History:  Procedure Laterality Date  . IR IMAGING GUIDED  PORT INSERTION  11/06/2019  . WRIST FRACTURE SURGERY Left     REVIEW OF SYSTEMS:  A comprehensive review of systems was negative except for: Constitutional: positive for fatigue Respiratory: positive for pleurisy/chest pain   PHYSICAL EXAMINATION: General appearance: alert, cooperative, fatigued and no distress Head: Normocephalic, without obvious abnormality, atraumatic Neck: no adenopathy, no JVD, supple, symmetrical, trachea midline and thyroid not enlarged, symmetric, no tenderness/mass/nodules Lymph nodes: Cervical, supraclavicular, and axillary nodes normal. Resp: clear to  auscultation bilaterally Back: symmetric, no curvature. ROM normal. No CVA tenderness. Cardio: regular rate and rhythm, S1, S2 normal, no murmur, click, rub or gallop GI: soft, non-tender; bowel sounds normal; no masses,  no organomegaly Extremities: extremities normal, atraumatic, no cyanosis or edema  ECOG PERFORMANCE STATUS: 1 - Symptomatic but completely ambulatory  Blood pressure (!) 152/88, pulse (!) 107, temperature 97.9 F (36.6 C), temperature source Temporal, resp. rate 20, height _0  (1.727 m), weight 132 lb 11.2 oz (60.2 kg), SpO2 100 %.  LABORATORY DATA: Lab Results  Component Value Date   WBC 8.1 11/16/2019   HGB 8.2 (L) 11/16/2019   HCT 26.2 (L) 11/16/2019   MCV 95.3 11/16/2019   PLT 251 11/16/2019      Chemistry      Component Value Date/Time   NA 140 11/09/2019 1156   K 3.9 11/09/2019 1156   CL 101 11/09/2019 1156   CO2 28 11/09/2019 1156   BUN 13 11/09/2019 1156   CREATININE 0.82 11/09/2019 1156      Component Value Date/Time   CALCIUM 9.3 11/09/2019 1156   ALKPHOS 98 11/09/2019 1156   AST 18 11/09/2019 1156   ALT 17 11/09/2019 1156   BILITOT 0.3 11/09/2019 1156       RADIOGRAPHIC STUDIES: CT Chest W Contrast  Result Date: 10/23/2019 CLINICAL DATA:  Asbestos exposure. Mesothelioma suspected. Right-sided chest pain. EXAM: CT CHEST WITH CONTRAST TECHNIQUE: Multidetector CT imaging of the chest was performed during intravenous contrast administration. CONTRAST:  35m OMNIPAQUE IOHEXOL 300 MG/ML  SOLN COMPARISON:  PET-CT 07/08/2019. Chest CT 06/27/2019. FINDINGS: Cardiovascular: The heart size is normal. No substantial pericardial effusion. No thoracic aortic aneurysm. Mediastinum/Nodes: Anterior mediastinal mass identified on prior study of 4.7 x 2.9 cm now measures 2.8 x 2.1 cm. Right internal mammary nodal disease measured previously at 2.2 cm now measures 1.5 cm when measured in a similar fashion on today's study (52/2). There is no hilar  lymphadenopathy. The esophagus has normal imaging features. There is no axillary lymphadenopathy. Lungs/Pleura: The relatively diffuse pleural disease seen laterally and posteriorly in the mid right hemithorax is qualitatively decreased in the interval. Focal lesion in the lateral aspect of the mid chest wall with demonstrated rib involvement on the prior study has decreased. Margins of the lesion are difficult to discern on today's study but best estimate is that this lesion now measures 3.1 x 1.9 cm which compares to 4.3 x 2.7 cm when I measure the lesion on the prior study in a similar fashion. Lesion in the lateral costophrenic sulcus generating mass-effect on the liver measures 4.6 x 2.5 cm today compared to 6.6 x 3.3 cm when remeasured in a similar fashion on the prior study. Right pleural effusion has decreased in the interval and fluid loculated in the right major fissure has resolved since the prior CT. Centrilobular emphsyema noted. 5 mm left upper lobe nodule on 45/7 is new in the interval 4 mm anterior left upper lobe nodule on 79/7 is new since prior. There  is also a 6 mm anterior left upper lobe nodule on 96/7, additional nodules measuring in the 6-8 mm size range are seen in the lingula and left lower lobe (116/7) new since prior. Upper Abdomen: Unremarkable. Tumefactive sludge or noncalcified gallstone evident (155/2). Musculoskeletal: Erosion again noted in the right fourth and sixth ribs. IMPRESSION: 1. Interval decrease in size of the anterior mediastinal and right internal mammary nodal metastases. 2. The multiple right-sided pleural plaques have also decreased since prior study. 3. Decreased right pleural effusion with interval resolution of the fluid loculated in the right major fissure. 4. Multiple new pulmonary nodules in the left lung have a sub solid appearance and measure 4-8 mm. Imaging features concerning for metastatic disease. 5. Erosion of the right fourth and sixth ribs, similar to  prior. 6. Tumefactive sludge or noncalcified gallstone. Electronically Signed   By: Misty Stanley M.D.   On: 10/23/2019 13:25   IR IMAGING GUIDED PORT INSERTION  Result Date: 11/06/2019 INDICATION: History of right-sided malignant mesothelioma. Please perform Port a catheter placement for durable intravenous access for chemotherapy administration. EXAM: IMPLANTED PORT A CATH PLACEMENT WITH ULTRASOUND AND FLUOROSCOPIC GUIDANCE COMPARISON:  None. MEDICATIONS: Ancef 2 gm IV; The antibiotic was administered within an appropriate time interval prior to skin puncture. ANESTHESIA/SEDATION: Moderate (conscious) sedation was employed during this procedure. A total of Versed 2.5 mg and Fentanyl 100 mcg was administered intravenously. Moderate Sedation Time: 28 minutes. The patient's level of consciousness and vital signs were monitored continuously by radiology nursing throughout the procedure under my direct supervision. CONTRAST:  None FLUOROSCOPY TIME:  42 seconds (5 mGy) COMPLICATIONS: None immediate. PROCEDURE: The procedure, risks, benefits, and alternatives were explained to the patient. Questions regarding the procedure were encouraged and answered. The patient understands and consents to the procedure. Given the presence of the right-sided malignant mesothelioma put incision was made to place a left internal jugular approach port a catheter. As such, the left neck and chest were prepped with chlorhexidine in a sterile fashion, and a sterile drape was applied covering the operative field. Maximum barrier sterile technique with sterile gowns and gloves were used for the procedure. A timeout was performed prior to the initiation of the procedure. Local anesthesia was provided with 1% lidocaine with epinephrine. After creating a small venotomy incision, a micropuncture kit was utilized to access the internal jugular vein. Real-time ultrasound guidance was utilized for vascular access including the acquisition of a  permanent ultrasound image documenting patency of the accessed vessel. The microwire was utilized to measure appropriate catheter length. A subcutaneous port pocket was then created along the upper chest wall utilizing a combination of sharp and blunt dissection. The pocket was irrigated with sterile saline. A single lumen "Slim" sized power injectable port was chosen for placement. The 8 Fr catheter was tunneled from the port pocket site to the venotomy incision. The port was placed in the pocket. The external catheter was trimmed to appropriate length. At the venotomy, an 8 Fr peel-away sheath was placed over a guidewire under fluoroscopic guidance. The catheter was then placed through the sheath and the sheath was removed. Final catheter positioning was confirmed and documented with a fluoroscopic spot radiograph. The port was accessed with a Huber needle, aspirated and flushed with heparinized saline. The venotomy site was closed with an interrupted 4-0 Vicryl suture. The port pocket incision was closed with interrupted 2-0 Vicryl suture. Dermabond and Steri-strips were applied to both incisions. Dressings were applied. The patient tolerated the procedure  well without immediate post procedural complication. FINDINGS: After catheter placement, the tip lies within the superior cavoatrial junction. The catheter aspirates and flushes normally and is ready for immediate use. IMPRESSION: Successful placement of a left internal jugular approach power injectable Port-A-Cath. The catheter is ready for immediate use. Electronically Signed   By: Sandi Mariscal M.D.   On: 11/06/2019 14:49    ASSESSMENT AND PLAN: This is a very pleasant 74 years old white male with recently diagnosed malignant pleural mesothelioma involving the right chest with evidence of metastatic disease to the mediastinal lymph nodes and large pleural-based masses as well as right pleural effusion. The patient started systemic chemotherapy with  cisplatin, Alimta and Avastin status post 1 cycle.  He has intolerance issues with the treatment with cisplatin with delayed nausea and dry heaves as well as the constipation. We switched his treatment to carboplatin, Alimta and Avastin status post 3 cycles.  The patient continues to tolerate this treatment much better. I recommended for him to proceed with cycle #4 today as planned. For the pain management, he was referred to the pain clinic.  He will continue his current treatment with MS Contin 60 mg every 12 hour in addition to Percocet for breakthrough pain. The patient will come back for follow-up visit in 3 weeks for evaluation before starting cycle #5. He was advised to call immediately if he has any concerning symptoms in the interval. The patient voices understanding of current disease status and treatment options and is in agreement with the current care plan.  All questions were answered. The patient knows to call the clinic with any problems, questions or concerns. We can certainly see the patient much sooner if necessary.  Disclaimer: This note was dictated with voice recognition software. Similar sounding words can inadvertently be transcribed and may not be corrected upon review.

## 2019-11-18 ENCOUNTER — Encounter: Payer: Self-pay | Admitting: Physical Medicine and Rehabilitation

## 2019-11-21 ENCOUNTER — Emergency Department (HOSPITAL_COMMUNITY): Payer: Medicare Other

## 2019-11-21 ENCOUNTER — Encounter (HOSPITAL_COMMUNITY): Payer: Self-pay | Admitting: Family Medicine

## 2019-11-21 ENCOUNTER — Inpatient Hospital Stay (HOSPITAL_COMMUNITY)
Admission: EM | Admit: 2019-11-21 | Discharge: 2019-11-23 | DRG: 292 | Disposition: A | Discharge status: Home / Self Care | Payer: Medicare Other | Attending: Family Medicine | Admitting: Family Medicine

## 2019-11-21 ENCOUNTER — Other Ambulatory Visit: Payer: Self-pay

## 2019-11-21 DIAGNOSIS — D6481 Anemia due to antineoplastic chemotherapy: Secondary | ICD-10-CM | POA: Diagnosis present

## 2019-11-21 DIAGNOSIS — C7951 Secondary malignant neoplasm of bone: Secondary | ICD-10-CM | POA: Diagnosis present

## 2019-11-21 DIAGNOSIS — Z9981 Dependence on supplemental oxygen: Secondary | ICD-10-CM | POA: Diagnosis not present

## 2019-11-21 DIAGNOSIS — C450 Mesothelioma of pleura: Secondary | ICD-10-CM | POA: Diagnosis present

## 2019-11-21 DIAGNOSIS — T451X5A Adverse effect of antineoplastic and immunosuppressive drugs, initial encounter: Secondary | ICD-10-CM | POA: Diagnosis present

## 2019-11-21 DIAGNOSIS — Z20822 Contact with and (suspected) exposure to covid-19: Secondary | ICD-10-CM | POA: Diagnosis present

## 2019-11-21 DIAGNOSIS — Z7952 Long term (current) use of systemic steroids: Secondary | ICD-10-CM | POA: Diagnosis not present

## 2019-11-21 DIAGNOSIS — I509 Heart failure, unspecified: Secondary | ICD-10-CM | POA: Diagnosis present

## 2019-11-21 DIAGNOSIS — Z87891 Personal history of nicotine dependence: Secondary | ICD-10-CM | POA: Diagnosis not present

## 2019-11-21 DIAGNOSIS — Z79899 Other long term (current) drug therapy: Secondary | ICD-10-CM

## 2019-11-21 DIAGNOSIS — C782 Secondary malignant neoplasm of pleura: Secondary | ICD-10-CM | POA: Diagnosis present

## 2019-11-21 DIAGNOSIS — I5031 Acute diastolic (congestive) heart failure: Principal | ICD-10-CM | POA: Diagnosis present

## 2019-11-21 LAB — COMPREHENSIVE METABOLIC PANEL
ALT: 26 U/L (ref 0–44)
AST: 17 U/L (ref 15–41)
Albumin: 2.3 g/dL — ABNORMAL LOW (ref 3.5–5.0)
Alkaline Phosphatase: 72 U/L (ref 38–126)
Anion gap: 12 (ref 5–15)
BUN: 14 mg/dL (ref 8–23)
CO2: 28 mmol/L (ref 22–32)
Calcium: 8.8 mg/dL — ABNORMAL LOW (ref 8.9–10.3)
Chloride: 98 mmol/L (ref 98–111)
Creatinine, Ser: 0.68 mg/dL (ref 0.61–1.24)
GFR calc Af Amer: >60 mL/min (ref >60)
GFR calc non Af Amer: >60 mL/min (ref >60)
Glucose, Bld: 109 mg/dL — ABNORMAL HIGH (ref 70–99)
Potassium: 4.1 mmol/L (ref 3.5–5.1)
Sodium: 138 mmol/L (ref 135–145)
Total Bilirubin: 0.7 mg/dL (ref 0.3–1.2)
Total Protein: 5.5 g/dL — ABNORMAL LOW (ref 6.5–8.1)

## 2019-11-21 LAB — CBC WITH DIFFERENTIAL/PLATELET
Abs Immature Granulocytes: 0.04 10*3/uL (ref 0.00–0.07)
Basophils Absolute: 0 10*3/uL (ref 0.0–0.1)
Basophils Relative: 0 %
Eosinophils Absolute: 0.1 10*3/uL (ref 0.0–0.5)
Eosinophils Relative: 1 %
HCT: 24.5 % — ABNORMAL LOW (ref 39.0–52.0)
Hemoglobin: 7.6 g/dL — ABNORMAL LOW (ref 13.0–17.0)
Immature Granulocytes: 1 %
Lymphocytes Relative: 2 %
Lymphs Abs: 0.1 10*3/uL — ABNORMAL LOW (ref 0.7–4.0)
MCH: 30.3 pg (ref 26.0–34.0)
MCHC: 31 g/dL (ref 30.0–36.0)
MCV: 97.6 fL (ref 80.0–100.0)
Monocytes Absolute: 0.2 10*3/uL (ref 0.1–1.0)
Monocytes Relative: 3 %
Neutro Abs: 6.4 10*3/uL (ref 1.7–7.7)
Neutrophils Relative %: 93 %
Platelets: 180 10*3/uL (ref 150–400)
RBC: 2.51 MIL/uL — ABNORMAL LOW (ref 4.22–5.81)
RDW: 17.4 % — ABNORMAL HIGH (ref 11.5–15.5)
WBC: 6.9 10*3/uL (ref 4.0–10.5)
nRBC: 0 % (ref 0.0–0.2)

## 2019-11-21 LAB — SARS CORONAVIRUS 2 BY RT PCR (HOSPITAL ORDER, PERFORMED IN ~~LOC~~ HOSPITAL LAB): SARS Coronavirus 2: NEGATIVE

## 2019-11-21 LAB — TROPONIN I (HIGH SENSITIVITY)
Troponin I (High Sensitivity): 7 ng/L (ref <18)
Troponin I (High Sensitivity): 3 ng/L (ref <18)

## 2019-11-21 LAB — BRAIN NATRIURETIC PEPTIDE: B Natriuretic Peptide: 3161.9 pg/mL — ABNORMAL HIGH (ref 0.0–100.0)

## 2019-11-21 MED ORDER — PANTOPRAZOLE SODIUM 40 MG PO TBEC
40.0000 mg | DELAYED_RELEASE_TABLET | Freq: Every day | ORAL | Status: DC
  Administered 2019-11-22 – 2019-11-23 (×2): 40 mg via ORAL
  Filled 2019-11-21 (×2): qty 1

## 2019-11-21 MED ORDER — ACETAMINOPHEN 650 MG RE SUPP: 650.0000 mg | Freq: Four times a day (QID) | RECTAL | Status: DC | PRN

## 2019-11-21 MED ORDER — CALCIUM POLYCARBOPHIL 625 MG PO TABS
625.0000 mg | ORAL_TABLET | Freq: Two times a day (BID) | ORAL | Status: DC
  Administered 2019-11-21 – 2019-11-22 (×4): 625 mg via ORAL
  Filled 2019-11-21 (×5): qty 1

## 2019-11-21 MED ORDER — OXYCODONE HCL 5 MG PO TABS
5.0000 mg | ORAL_TABLET | Freq: Four times a day (QID) | ORAL | Status: DC | PRN
  Administered 2019-11-21: 5 mg via ORAL
  Filled 2019-11-21: qty 1

## 2019-11-21 MED ORDER — FINASTERIDE 5 MG PO TABS
5.0000 mg | ORAL_TABLET | Freq: Every day | ORAL | Status: DC
  Administered 2019-11-21 – 2019-11-23 (×3): 5 mg via ORAL
  Filled 2019-11-21 (×3): qty 1

## 2019-11-21 MED ORDER — IOHEXOL 350 MG/ML SOLN
100.0000 mL | Freq: Once | INTRAVENOUS | Status: AC | PRN
  Administered 2019-11-21: 100 mL via INTRAVENOUS

## 2019-11-21 MED ORDER — SODIUM CHLORIDE (PF) 0.9 % IJ SOLN
INTRAMUSCULAR | Status: AC
  Filled 2019-11-21: qty 50

## 2019-11-21 MED ORDER — ONDANSETRON HCL 4 MG/2ML IJ SOLN: 4.0000 mg | Freq: Four times a day (QID) | INTRAMUSCULAR | Status: DC | PRN

## 2019-11-21 MED ORDER — FOLIC ACID 1 MG PO TABS
1.0000 mg | ORAL_TABLET | Freq: Every day | ORAL | Status: DC
  Administered 2019-11-21 – 2019-11-23 (×3): 1 mg via ORAL
  Filled 2019-11-21 (×3): qty 1

## 2019-11-21 MED ORDER — TAMSULOSIN HCL 0.4 MG PO CAPS
0.4000 mg | ORAL_CAPSULE | Freq: Every day | ORAL | Status: DC
  Administered 2019-11-21 – 2019-11-23 (×3): 0.4 mg via ORAL
  Filled 2019-11-21 (×3): qty 1

## 2019-11-21 MED ORDER — ENOXAPARIN SODIUM 40 MG/0.4ML ~~LOC~~ SOLN
40.0000 mg | SUBCUTANEOUS | Status: DC
  Filled 2019-11-21 (×2): qty 0.4

## 2019-11-21 MED ORDER — FUROSEMIDE 10 MG/ML IJ SOLN
60.0000 mg | Freq: Once | INTRAMUSCULAR | Status: AC
  Administered 2019-11-21: 60 mg via INTRAVENOUS
  Filled 2019-11-21: qty 8

## 2019-11-21 MED ORDER — BISACODYL 5 MG PO TBEC
5.0000 mg | DELAYED_RELEASE_TABLET | Freq: Two times a day (BID) | ORAL | Status: DC
  Administered 2019-11-22 – 2019-11-23 (×3): 5 mg via ORAL
  Filled 2019-11-21 (×3): qty 1

## 2019-11-21 MED ORDER — FUROSEMIDE 10 MG/ML IJ SOLN
40.0000 mg | Freq: Two times a day (BID) | INTRAMUSCULAR | Status: DC
  Administered 2019-11-22 – 2019-11-23 (×3): 40 mg via INTRAVENOUS
  Filled 2019-11-21 (×3): qty 4

## 2019-11-21 MED ORDER — ACETAMINOPHEN 325 MG PO TABS: 650.0000 mg | ORAL_TABLET | Freq: Four times a day (QID) | ORAL | Status: DC | PRN

## 2019-11-21 MED ORDER — POTASSIUM CHLORIDE CRYS ER 10 MEQ PO TBCR
10.0000 meq | EXTENDED_RELEASE_TABLET | Freq: Two times a day (BID) | ORAL | Status: DC
  Administered 2019-11-21 – 2019-11-22 (×2): 10 meq via ORAL
  Filled 2019-11-21 (×2): qty 1

## 2019-11-21 MED ORDER — ONDANSETRON HCL 4 MG PO TABS: 4.0000 mg | ORAL_TABLET | Freq: Four times a day (QID) | ORAL | Status: DC | PRN

## 2019-11-21 MED ORDER — MORPHINE SULFATE ER 30 MG PO TBCR
60.0000 mg | EXTENDED_RELEASE_TABLET | Freq: Two times a day (BID) | ORAL | Status: DC
  Administered 2019-11-21 – 2019-11-23 (×4): 60 mg via ORAL
  Filled 2019-11-21 (×4): qty 2

## 2019-11-21 MED ORDER — SENNA 8.6 MG PO TABS: 1.0000 | ORAL_TABLET | Freq: Two times a day (BID) | ORAL | Status: DC

## 2019-11-21 MED ORDER — PROCHLORPERAZINE MALEATE 10 MG PO TABS: 10.0000 mg | ORAL_TABLET | Freq: Four times a day (QID) | ORAL | Status: DC | PRN

## 2019-11-21 NOTE — H&P (Signed)
History and Physical  Patient Name: Mario Harmon     HQP:[id number]    DOB: 15-Nov-1945    DOA: 11/21/2019 PCP: Patient, No Pcp Per  Patient coming from: Home  Chief Complaint: Dyspnea, orthopnea, leg sewlling      HPI: Mario Harmon is a 74 y.o. M with hx mesothelioma on chemo with carboplatinum and bevacizumab who presents with several days leg swelling, now orthopnea and paroxysmal nocturnal dyspnea.  About a week ago the patient started to notice some swelling in his legs that was new.  He also noted shortness of breath with exertion that was worse than his baseline, as well as progressive orthopnea.  Then this morning, the patient woke up very short of breath, called 911.  In the ER, afebrile, heart rate 116, respirations up to 31.  Oxygen saturation normal.  Chest x-ray was hard to interpret with his mesothelioma, but CT chest showed bilateral edema and BNP was greater than 3000.  He was started on Lasix and the hospitalist service were asked to evaluate.    ROS: Review of Systems  Constitutional: Negative for chills, fever and malaise/fatigue.  Cardiovascular: Positive for orthopnea, leg swelling and PND. Negative for chest pain.  All other systems reviewed and are negative.         Past Medical History:  Diagnosis Date  . Mesothelioma [facility])     Past Surgical History:  Procedure Laterality Date  . IR IMAGING GUIDED PORT INSERTION  11/06/2019  . WRIST FRACTURE SURGERY Left     Social History: Patient lives with his wife.  Retired Chief Financial Officer.  The patient walks unassisted.  Nons moker.  No Known Allergies  Family history: No prior history of HF.  Prior to Admission medications   Medication Sig Start Date End Date Taking? Authorizing Provider  bisacodyl (DULCOLAX) 5 MG EC tablet Take 5 mg by mouth daily as needed for moderate constipation.   Yes [provider]  dexamethasone (DECADRON) 4 MG tablet 1 tablet p.o. twice daily the day before, day of and  day after chemotherapy every 3 weeks. 07/21/19  Yes Curt Bears, MD  finasteride (PROSCAR) 5 MG tablet Take 5 mg by mouth daily. 10/14/19  Yes [provider]  folic acid (FOLVITE) 1 MG tablet TAKE 1 TABLET BY MOUTH EVERY DAY Patient taking differently: Take 1 mg by mouth daily.  11/14/19  Yes Curt Bears, MD  lidocaine-prilocaine (EMLA) cream Apply 1 application topically as needed. Apply small amount to cotton ball and apply to skin over port site at lease 1 hour prior to chemotherapy. Do not rub in and cover plastic wrap. 11/12/19  Yes Curt Bears, MD  morphine (MS CONTIN) 60 MG 12 hr tablet Take 1 tablet (60 mg total) by mouth every 12 (twelve) hours. 10/30/19  Yes Curt Bears, MD  oxyCODONE (OXY IR/ROXICODONE) 5 MG immediate release tablet Take 1 tablet (5 mg total) by mouth every 6 (six) hours as needed for severe pain. 11/10/19  Yes Curt Bears, MD  pantoprazole (PROTONIX) 40 MG tablet TAKE 1 TABLET BY MOUTH PRIOR TO BREAKFAST Patient taking differently: Take 40 mg by mouth daily.  09/16/19  Yes Hayden Pedro, PA-C  polycarbophil (FIBERCON) 625 MG tablet Take 625 mg by mouth daily.   Yes [provider]  prochlorperazine (COMPAZINE) 10 MG tablet TAKE 1 TABLET (10 MG TOTAL) BY MOUTH EVERY 6 (SIX) HOURS AS NEEDED FOR NAUSEA OR VOMITING. 11/16/19  Yes Heilingoetter, Cassandra L, PA-C  tamsulosin (FLOMAX) 0.4  MG CAPS capsule Take 1 capsule (0.4 mg total) by mouth daily. 09/15/19  Yes Quintella Reichert, MD       Physical Exam: BP (!) 148/90   Pulse 90   Temp 98.6 F (37 C) (Oral)   Resp 18   Ht 5\' 8"  (1.727 m)   Wt 60.2 kg   SpO2 98%   BMI 20.18 kg/m  General appearance: Thin, chronically ill-appearing elderly adult male, alert and in mild distress due to dyspnea.   Eyes: Anicteric, conjunctiva pink, lids and lashes normal. PERRL.    ENT: No nasal deformity, discharge, epistaxis.  Hearing no normal. OP moist without lesions.   Neck: No neck  masses.  Trachea midline.  No thyromegaly/tenderness. Lymph: No cervical or supraclavicular lymphadenopathy. Skin: Warm and dry.  No jaundice.  No suspicious rashes or lesions. Cardiac: RRR, nl S1-S2, no murmurs appreciated.  Capillary refill is brisk JVP normal.  3+ LE edema.  Radial pulses 2+ and symmetric. Respiratory: Tachypneic, shallow, lung sounds diminished, I do not appreciate rales or wheezing. Abdomen: Abdomen soft.  No TTP guarding. No ascites, distension, hepatosplenomegaly.   MSK: No deformities or effusions of the large joints of the upper or lower extremities bilaterally.  No cyanosis or clubbing.  Diffuse loss of subcutaneous muscle mass and fat. Neuro: Cranial nerves normal.  Sensation intact to light touch. Speech is fluent.  Muscle strength 3 through 12 intact.    Psych: Sensorium intact and responding to questions, attention normal.  Behavior appropriate.  Affect normal.  Judgment and insight appear normal.     Labs on Admission:  I have personally reviewed following labs and imaging studies: CBC: Recent Labs  Lab 11/16/19 1216 11/21/19 0916  WBC 8.1 6.9  NEUTROABS 7.1 6.4  HGB 8.2* 7.6*  HCT 26.2* 24.5*  MCV 95.3 97.6  PLT 251 962   Basic Metabolic Panel: Recent Labs  Lab 11/16/19 1216 11/21/19 0916  NA 142 138  K 4.4 4.1  CL 101 98  CO2 31 28  GLUCOSE 118* 109*  BUN 15 14  CREATININE 0.88 0.68  CALCIUM 9.7 8.8*  MG 1.7  --    GFR: Estimated Creatinine Clearance: 72.1 mL/min (by C-G formula based on SCr of 0.68 mg/dL).  Liver Function Tests: Recent Labs  Lab 11/16/19 1216 11/21/19 0916  AST 19 17  ALT 24 26  ALKPHOS 111 72  BILITOT <0.2* 0.7  PROT 6.5 5.5*  ALBUMIN 2.5* 2.3*     Recent Results (from the past 240 hour(s))  SARS Coronavirus 2 by RT PCR (hospital order, performed in [facility] hospital lab) Nasopharyngeal Nasopharyngeal Swab     Status: None   Collection Time: 11/21/19 12:46 PM   Specimen: Nasopharyngeal Swab  Result  Value Ref Range Status   SARS Coronavirus 2 NEGATIVE NEGATIVE Final    Comment: (NOTE) SARS-CoV-2 target nucleic acids are NOT DETECTED. The SARS-CoV-2 RNA is generally detectable in upper and lower respiratory specimens during the acute phase of infection. The lowest concentration of SARS-CoV-2 viral copies this assay can detect is 250 copies / mL. A negative result does not preclude SARS-CoV-2 infection and should not be used as the sole basis for treatment or other patient management decisions.  A negative result may occur with improper specimen collection / handling, submission of specimen other than nasopharyngeal swab, presence of viral mutation(s) within the areas targeted by this assay, and inadequate number of viral copies (<250 copies / mL). A negative result must be combined with  clinical observations, patient history, and epidemiological information. Fact Sheet for Patients:   StrictlyIdeas.no Fact Sheet for Healthcare Providers: BankingDealers.co.za This test is not yet approved or cleared  by the [place] FDA and has been authorized for detection and/or diagnosis of SARS-CoV-2 by FDA under an Emergency Use Authorization (EUA).  This EUA will remain in effect (meaning this test can be used) for the duration of the COVID-19 declaration under Section 564(b)(1) of the Act, 21 U.S.C. section 360bbb-3(b)(1), unless the authorization is terminated or revoked sooner. Performed at [facility], [place] [address] [place], [place] [address]            Radiological Exams on Admission: Personally reviewed chest x-ray shows endothelium on the right.  CT scan was personally reviewed, shows some groundglass opacity in the bases bilaterally, in a dependent distribution, small pleural effusions: DG Chest 2 View  Result Date: 11/21/2019 CLINICAL DATA:  History of mesothelioma and shortness of breath EXAM: CHEST - 2  VIEW COMPARISON:  10/23/2019 FINDINGS: Cardiac shadow is stable. Lungs are well aerated bilaterally. Left chest wall port is noted in satisfactory position. Persistent pleural thickening is noted laterally on the right although slightly decreased when compare with the prior exam. Persistent small right pleural effusion is noted. These changes are consistent with the given clinical history of mesothelioma. No acute bony abnormality is noted. IMPRESSION: Pleural thickening and pleural effusion on the right consistent with the given clinical history of mesothelioma. No acute abnormality is noted. Electronically Signed   By: Inez Catalina M.D.   On: 11/21/2019 10:08   CT Angio Chest PE W and/or Wo Contrast  Result Date: 11/21/2019 CLINICAL DATA:  Acute onset shortness of breath, tachypnea, and tachycardia. Undergoing chemotherapy for malignant mesothelioma. EXAM: CT ANGIOGRAPHY CHEST WITH CONTRAST TECHNIQUE: Multidetector CT imaging of the chest was performed using the standard protocol during bolus administration of intravenous contrast. Multiplanar CT image reconstructions and MIPs were obtained to evaluate the vascular anatomy. CONTRAST:  124mL OMNIPAQUE IOHEXOL 350 MG/ML SOLN COMPARISON:  10/23/2019 FINDINGS: Cardiovascular: Satisfactory opacification of pulmonary arteries noted, and no pulmonary emboli identified. No evidence of thoracic aortic dissection or aneurysm. Mediastinum/Nodes: Anterior mediastinal mass along the right anterior heart border measures 4.1 x 2.7 cm on image 170/7 compared to 3.5 x 2.4 cm when measured in same planes on previous study. Right internal mammary chain lymphadenopathy measures 1.4 cm short axis on image 112/7, compared to 1.5 cm previously. No new masses or lymphadenopathy identified. Lungs/Pleura: Diffuse lobulated right pleural soft tissue density and small amount of fluid shows no significant change compared to prior exam. New tiny left pleural effusion is seen. Mild  thickening of interlobular septi and hazy ground-glass opacity in both lower lobes is increased and may be due to mild interstitial edema or pneumonitis. Multiple small pulmonary nodules throughout the left lung show mild increase in size since previous study. One index nodule in the anterior left upper lobe currently measures 10 mm on image 95/12, compared to 6 mm previously. A new 4 mm pulmonary nodule seen in the anterior right upper lobe on image 37/12. Upper abdomen: A 2.1 by 1.7 cm low-attenuation lesion in the liver adjacent to the gallbladder fundus kidney seen on previous study and shows no significant change. This is indeterminate. Musculoskeletal: Erosion of the right lateral 4th, 5th, and 6th ribs by the adjacent pleural soft tissue density shows no significant change. Review of the MIP images confirms the above findings. IMPRESSION: 1. No evidence  of pulmonary embolism. 2. New tiny left pleural effusion. Increased thickening of interlobular septi and hazy ground-glass opacity in both lower lobes, suspicious for mild interstitial edema or pneumonitis. 3. Mild increase in size of multiple left pulmonary nodules, consistent with metastatic disease. 4. No significant change in diffuse lobulated right pleural soft tissue density, consistent with known malignant mesothelioma. Stable erosion of right lateral 4th, 5th, and 6th ribs. 5. Mild increase in size of anterior mediastinal mass/lymphadenopathy. Stable mild right internal mammary chain lymphadenopathy. 6. Stable indeterminate low-attenuation lesion in the liver, adjacent to the gallbladder fundus. Electronically Signed   By: Marlaine Hind M.D.   On: 11/21/2019 12:28    EKG: Independently reviewed.  ECG showed sinus tachycardia without ST depressions.       Assessment/Plan   New onset congestive heart failure -Furosemide 40 mg IV twice a day  -K supplement -Strict I/Os, daily weights, telemetry  -Daily monitoring renal function -Obtain  echocardiogram -Check TSH     Mesothelioma Dr. Earlie Server was added to the treatment team, no specific consult was requested.  Anemia due to chemotherapy -Trend hemoglobin -If CHF is slow to improve, may need transfusion     DVT prophylaxis: Lovenox Code Status: Full code, confirmed with the patient Family Communication:   Disposition Plan: Anticipate 1 to 2 days IV diuresis, when peripheral edema is resolved, likely home with oral diuretic.  Disposition pending echocardiogram. Consults called: None Admission status: Inpatient     Medical decision making: Patient seen at 4:49 PM on 11/21/2019.  The patient was discussed with Dr. Loanne Drilling.  What exists of the patient's chart was reviewed in depth and summarized above.  Clinical condition: Stable clinically.        [facility] Please page though [place] or Epic secure chat:  For password, contact charge nurse

## 2019-11-21 NOTE — ED Notes (Signed)
Patient ambulated to the restroom with minimal assistance

## 2019-11-21 NOTE — ED Provider Notes (Signed)
[facility] DEPT Provider Note   CSN: [id number] Arrival date & time: 11/21/19  0753     History Chief Complaint  Patient presents with  . Shortness of Breath    Mario Harmon is a 74 y.o. male with past medical history significant for mesothelioma followed by Dr. Julien Nordmann who presents to the ED with a 1 day history of progressively worsening shortness of breath symptoms.  Patient states that yesterday he developed some shortness of breath and that last evening while laying down he felt as though he could not breathe.  On my examination, patient is tachycardic and tachypneic.  He states that he has persistently been experiencing right-sided chest pain that he attributes to his mesothelioma.  His oncologist referred him to a pain management clinic and he plans to go there soon.  He noticed today that his legs are swollen which is unusual for him.  He denies any recent illness, fevers or chills, cough, new or unusual chest pain, abdominal discomfort, nausea or vomiting, or other symptoms.  No history of VTE, MI, recent surgery or immobilization, tobacco use, or hormone replacement.  Patient has already received his COVID-19 vaccinations.  HPI     Past Medical History:  Diagnosis Date  . Mesothelioma [facility])     Patient Active Problem List   Diagnosis Date Noted  . Acute CHF (congestive heart failure) ([place]) 11/21/2019  . History of nausea 08/29/2019  . History of anemia 08/29/2019  . Retching 08/29/2019  . Other constipation 08/29/2019  . Nausea & vomiting 07/31/2019  . 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 Goals of care, counseling/discussion 07/21/2019  . Encounter for antineoplastic chemotherapy 07/21/2019  . Pleural effusion on right 06/29/2019  . 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 IR IMAGING GUIDED  PORT INSERTION  11/06/2019  . WRIST FRACTURE SURGERY Left        Family History  Problem Relation Age of Onset  . Esophageal cancer Neg Hx   . Colon cancer Neg Hx   . Pancreatic cancer Neg Hx   . Liver disease Neg Hx   . Inflammatory bowel disease Neg Hx   . Stomach cancer Neg Hx   . Ulcerative colitis Neg Hx     Social History   Tobacco Use  . Smoking status: Former Smoker    Types: Pipe  . Smokeless tobacco: Never Used  . Tobacco comment: many years ago he smoked a pipe.  Substance Use Topics  . Alcohol use: Not Currently  . Drug use: Never    Home Medications Prior to Admission medications   Medication Sig Start Date End Date Taking? Authorizing Provider  bisacodyl (DULCOLAX) 5 MG EC tablet Take 5 mg by mouth daily as needed for moderate constipation.   Yes [provider]  dexamethasone (DECADRON) 4 MG tablet 1 tablet p.o. twice daily the day before, day of and day after chemotherapy every 3 weeks. 07/21/19  Yes Curt Bears, MD  finasteride (PROSCAR) 5 MG tablet Take 5 mg by mouth daily. 10/14/19  Yes [provider]  folic acid (FOLVITE) 1 MG tablet TAKE 1 TABLET BY MOUTH EVERY DAY Patient taking differently: Take 1 mg by mouth daily.  11/14/19  Yes Curt Bears, MD  lidocaine-prilocaine (EMLA) cream Apply 1 application topically as needed. Apply small amount to cotton ball and apply to skin over port  site at lease 1 hour prior to chemotherapy. Do not rub in and cover plastic wrap. 11/12/19  Yes Curt Bears, MD  morphine (MS CONTIN) 60 MG 12 hr tablet Take 1 tablet (60 mg total) by mouth every 12 (twelve) hours. 10/30/19  Yes Curt Bears, MD  oxyCODONE (OXY IR/ROXICODONE) 5 MG immediate release tablet Take 1 tablet (5 mg total) by mouth every 6 (six) hours as needed for severe pain. 11/10/19  Yes Curt Bears, MD  pantoprazole (PROTONIX) 40 MG tablet TAKE 1 TABLET BY MOUTH PRIOR TO BREAKFAST Patient taking differently: Take 40 mg by  mouth daily.  09/16/19  Yes Hayden Pedro, PA-C  polycarbophil (FIBERCON) 625 MG tablet Take 625 mg by mouth daily.   Yes [provider]  prochlorperazine (COMPAZINE) 10 MG tablet TAKE 1 TABLET (10 MG TOTAL) BY MOUTH EVERY 6 (SIX) HOURS AS NEEDED FOR NAUSEA OR VOMITING. 11/16/19  Yes Heilingoetter, Cassandra L, PA-C  tamsulosin (FLOMAX) 0.4 MG CAPS capsule Take 1 capsule (0.4 mg total) by mouth daily. 09/15/19  Yes Quintella Reichert, MD    Allergies    Patient has no known allergies.  Review of Systems   Review of Systems  All other systems reviewed and are negative.   Physical Exam Updated Vital Signs BP (!) 153/99   Pulse 100   Temp 98.6 F (37 C) (Oral)   Resp 18   Ht 5\' 8"  (1.727 m)   Wt 60.2 kg   SpO2 97%   BMI 20.18 kg/m   Physical Exam Vitals and nursing note reviewed. Exam conducted with a chaperone present.  Constitutional:      Appearance: He is ill-appearing.  HENT:     Head: Normocephalic and atraumatic.  Eyes:     General: No scleral icterus.    Conjunctiva/sclera: Conjunctivae normal.  Cardiovascular:     Rate and Rhythm: Normal rate and regular rhythm.     Pulses: Normal pulses.     Heart sounds: Normal heart sounds.  Pulmonary:     Comments: Increased work of breathing.  Patient is not able to speak in full sentences without taking a break to inhale.  Persistently tachypneic in the 20s and 30s.  Chest rises symmetric.  Breath sounds intact bilaterally.  Mildly diminished on right side. Musculoskeletal:     Cervical back: Normal range of motion and neck supple. No rigidity.     Comments: Lymphedema bilaterally.  Skin:    General: Skin is dry.     Capillary Refill: Capillary refill takes less than 2 seconds.  Neurological:     Mental Status: He is alert and oriented to person, place, and time.     GCS: GCS eye subscore is 4. GCS verbal subscore is 5. GCS motor subscore is 6.  Psychiatric:        Mood and Affect: Mood normal.         Behavior: Behavior normal.        Thought Content: Thought content normal.     ED Results / Procedures / Treatments   Labs (all labs ordered are listed, but only abnormal results are displayed) Labs Reviewed  CBC WITH DIFFERENTIAL/PLATELET - Abnormal; Notable for the following components:      Result Value   RBC 2.51 (*)    Hemoglobin 7.6 (*)    HCT 24.5 (*)    RDW 17.4 (*)    Lymphs Abs 0.1 (*)    All other components within normal limits  COMPREHENSIVE METABOLIC PANEL -  Abnormal; Notable for the following components:   Glucose, Bld 109 (*)    Calcium 8.8 (*)    Total Protein 5.5 (*)    Albumin 2.3 (*)    All other components within normal limits  BRAIN NATRIURETIC PEPTIDE - Abnormal; Notable for the following components:   B Natriuretic Peptide 3,161.9 (*)    All other components within normal limits  SARS CORONAVIRUS 2 BY RT PCR (HOSPITAL ORDER, [facility])  TROPONIN I (HIGH SENSITIVITY)  TROPONIN I (HIGH SENSITIVITY)    EKG None  Radiology DG Chest 2 View  Result Date: 11/21/2019 CLINICAL DATA:  History of mesothelioma and shortness of breath EXAM: CHEST - 2 VIEW COMPARISON:  10/23/2019 FINDINGS: Cardiac shadow is stable. Lungs are well aerated bilaterally. Left chest wall port is noted in satisfactory position. Persistent pleural thickening is noted laterally on the right although slightly decreased when compare with the prior exam. Persistent small right pleural effusion is noted. These changes are consistent with the given clinical history of mesothelioma. No acute bony abnormality is noted. IMPRESSION: Pleural thickening and pleural effusion on the right consistent with the given clinical history of mesothelioma. No acute abnormality is noted. Electronically Signed   By: Inez Catalina M.D.   On: 11/21/2019 10:08   CT Angio Chest PE W and/or Wo Contrast  Result Date: 11/21/2019 CLINICAL DATA:  Acute onset shortness of breath, tachypnea, and  tachycardia. Undergoing chemotherapy for malignant mesothelioma. EXAM: CT ANGIOGRAPHY CHEST WITH CONTRAST TECHNIQUE: Multidetector CT imaging of the chest was performed using the standard protocol during bolus administration of intravenous contrast. Multiplanar CT image reconstructions and MIPs were obtained to evaluate the vascular anatomy. CONTRAST:  126mL OMNIPAQUE IOHEXOL 350 MG/ML SOLN COMPARISON:  10/23/2019 FINDINGS: Cardiovascular: Satisfactory opacification of pulmonary arteries noted, and no pulmonary emboli identified. No evidence of thoracic aortic dissection or aneurysm. Mediastinum/Nodes: Anterior mediastinal mass along the right anterior heart border measures 4.1 x 2.7 cm on image 170/7 compared to 3.5 x 2.4 cm when measured in same planes on previous study. Right internal mammary chain lymphadenopathy measures 1.4 cm short axis on image 112/7, compared to 1.5 cm previously. No new masses or lymphadenopathy identified. Lungs/Pleura: Diffuse lobulated right pleural soft tissue density and small amount of fluid shows no significant change compared to prior exam. New tiny left pleural effusion is seen. Mild thickening of interlobular septi and hazy ground-glass opacity in both lower lobes is increased and may be due to mild interstitial edema or pneumonitis. Multiple small pulmonary nodules throughout the left lung show mild increase in size since previous study. One index nodule in the anterior left upper lobe currently measures 10 mm on image 95/12, compared to 6 mm previously. A new 4 mm pulmonary nodule seen in the anterior right upper lobe on image 37/12. Upper abdomen: A 2.1 by 1.7 cm low-attenuation lesion in the liver adjacent to the gallbladder fundus kidney seen on previous study and shows no significant change. This is indeterminate. Musculoskeletal: Erosion of the right lateral 4th, 5th, and 6th ribs by the adjacent pleural soft tissue density shows no significant change. Review of the MIP  images confirms the above findings. IMPRESSION: 1. No evidence of pulmonary embolism. 2. New tiny left pleural effusion. Increased thickening of interlobular septi and hazy ground-glass opacity in both lower lobes, suspicious for mild interstitial edema or pneumonitis. 3. Mild increase in size of multiple left pulmonary nodules, consistent with metastatic disease. 4. No significant change in diffuse  lobulated right pleural soft tissue density, consistent with known malignant mesothelioma. Stable erosion of right lateral 4th, 5th, and 6th ribs. 5. Mild increase in size of anterior mediastinal mass/lymphadenopathy. Stable mild right internal mammary chain lymphadenopathy. 6. Stable indeterminate low-attenuation lesion in the liver, adjacent to the gallbladder fundus. Electronically Signed   By: Marlaine Hind M.D.   On: 11/21/2019 12:28    Procedures Procedures (including critical care time)  Medications Ordered in ED Medications  sodium chloride (PF) 0.9 % injection (has no administration in time range)  furosemide (LASIX) injection 60 mg (60 mg Intravenous Given 11/21/19 1207)  iohexol (OMNIPAQUE) 350 MG/ML injection 100 mL (100 mLs Intravenous Contrast Given 11/21/19 1139)    ED Course  I have reviewed the triage vital signs and the nursing notes.  Pertinent labs & imaging results that were available during my care of the patient were reviewed by me and considered in my medical decision making (see chart for details).  Clinical Course as of Nov 20 1328  Sat Nov 20, 8057  1147 74 year old male with a history of mesothelioma currently getting chemo infusions presenting to ED with shortness of breath.  States he woke up feeling like he was breathing but not getting oxygen into his lungs this morning.  He has never experienced this kind of thing before.  He notes that his lower extremities have been swelling for the past several days.  He has no known history of congestive heart failure.  On exam he is  satting 98% on room air.  He is mildly tachypneic and tachycardic.  He does appear to be breathing heavily.  Labs are notable for an elevated BNP at 3100.  His initial trope is 7.  Second Lance Bosch is pending.  Concerning for new onset of congestive heart failure.  This may be a reaction to his chemo infusion.  Alternatively, we need to consider pulmonary embolism and will obtain a CT PE.  I anticipate he will need admission for echocardiogram regardless.  Plan to give 60 mg IV lasix here.   [MT]    Clinical Course User Index [MT] Langston Masker Carola Rhine, MD   MDM Rules/Calculators/A&P                      Patient's history and physical exam is consistent with CHF exacerbation.  He has no known history of congestive heart failure, but he is endorsing symptoms of orthopnea and bilateral lower leg swelling.  Obtain basic laboratory work-up which demonstrates elevated BNP to 3161.9.  Patient is anemic to 7.6 hemoglobin, however consistent with his baseline.  He denies any hematochezia or melena.  While his acute onset shortness of breath and "feeling like I do not get oxygen in my lungs" is concerning for pulmonary embolism, particularly in context of known mesothelioma and new onset CHF, obtained CTA PE study that was negative for embolism.  Troponin is trended x2 and WNL. You could expect that often may be mildly bumped in context of PE.    Starting patient on 60 mg IV Lasix.  While he is saturating well on RA, patient was demonstrating increased work of breathing during my examination and was persistently tachypneic.  Given that this is a new onset congestive heart failure picture, will consult hospitalist for admission.  Will obtain COVID-19 testing prior to consult.    Final Clinical Impression(s) / ED Diagnoses Final diagnoses:  Acute congestive heart failure, unspecified heart failure type ([place])    Rx /  DC Orders ED Discharge Orders    None       Reita Chard 11/21/19 1330    Wyvonnia Dusky, MD 11/21/19 1721

## 2019-11-21 NOTE — ED Triage Notes (Signed)
Per patient he feels as if he is short of breath. Patient states this started last night, he woke up and had to gasp for air. Denies pain r/t new shobr. Patient states he is a chemo patient, last treatment was Monday. o2 sat 97% room air.

## 2019-11-21 NOTE — ED Notes (Signed)
Pt to xray

## 2019-11-21 NOTE — ED Notes (Signed)
ED Provider at bedside. 

## 2019-11-22 ENCOUNTER — Inpatient Hospital Stay (HOSPITAL_COMMUNITY): Payer: Medicare Other

## 2019-11-22 DIAGNOSIS — C450 Mesothelioma of pleura: Secondary | ICD-10-CM

## 2019-11-22 DIAGNOSIS — I5031 Acute diastolic (congestive) heart failure: Secondary | ICD-10-CM

## 2019-11-22 DIAGNOSIS — D6481 Anemia due to antineoplastic chemotherapy: Secondary | ICD-10-CM

## 2019-11-22 LAB — BASIC METABOLIC PANEL
Anion gap: 15 (ref 5–15)
BUN: 14 mg/dL (ref 8–23)
CO2: 28 mmol/L (ref 22–32)
Calcium: 9.1 mg/dL (ref 8.9–10.3)
Chloride: 94 mmol/L — ABNORMAL LOW (ref 98–111)
Creatinine, Ser: 0.68 mg/dL (ref 0.61–1.24)
GFR calc Af Amer: >60 mL/min (ref >60)
GFR calc non Af Amer: >60 mL/min (ref >60)
Glucose, Bld: 111 mg/dL — ABNORMAL HIGH (ref 70–99)
Potassium: 3.4 mmol/L — ABNORMAL LOW (ref 3.5–5.1)
Sodium: 137 mmol/L (ref 135–145)

## 2019-11-22 LAB — CBC
HCT: 27.6 % — ABNORMAL LOW (ref 39.0–52.0)
Hemoglobin: 8.7 g/dL — ABNORMAL LOW (ref 13.0–17.0)
MCH: 30.3 pg (ref 26.0–34.0)
MCHC: 31.5 g/dL (ref 30.0–36.0)
MCV: 96.2 fL (ref 80.0–100.0)
Platelets: 162 10*3/uL (ref 150–400)
RBC: 2.87 MIL/uL — ABNORMAL LOW (ref 4.22–5.81)
RDW: 17.5 % — ABNORMAL HIGH (ref 11.5–15.5)
WBC: 3.7 10*3/uL — ABNORMAL LOW (ref 4.0–10.5)
nRBC: 0 % (ref 0.0–0.2)

## 2019-11-22 LAB — PREPARE RBC (CROSSMATCH)

## 2019-11-22 LAB — LACTIC ACID, PLASMA
Lactic Acid, Venous: 1.1 mmol/L (ref 0.5–1.9)
Lactic Acid, Venous: 0.8 mmol/L (ref 0.5–1.9)

## 2019-11-22 LAB — ECHOCARDIOGRAM COMPLETE
Height: 68 in
Weight: 1836.8 oz

## 2019-11-22 LAB — ABO/RH: ABO/RH(D): A POS

## 2019-11-22 MED ORDER — CHLORHEXIDINE GLUCONATE CLOTH 2 % EX PADS
6.0000 | MEDICATED_PAD | Freq: Every day | CUTANEOUS | Status: DC
  Administered 2019-11-22 – 2019-11-23 (×2): 6 via TOPICAL

## 2019-11-22 MED ORDER — FUROSEMIDE 10 MG/ML IJ SOLN: 20.0000 mg | Freq: Once | INTRAMUSCULAR | Status: DC

## 2019-11-22 MED ORDER — SODIUM CHLORIDE 0.9% FLUSH
10.0000 mL | INTRAVENOUS | Status: DC | PRN
  Administered 2019-11-23: 10 mL

## 2019-11-22 MED ORDER — ACETAMINOPHEN 325 MG PO TABS
650.0000 mg | ORAL_TABLET | Freq: Once | ORAL | Status: AC
  Administered 2019-11-22: 650 mg via ORAL
  Filled 2019-11-22: qty 2

## 2019-11-22 MED ORDER — POTASSIUM CHLORIDE CRYS ER 20 MEQ PO TBCR
20.0000 meq | EXTENDED_RELEASE_TABLET | Freq: Two times a day (BID) | ORAL | Status: DC
  Administered 2019-11-22 – 2019-11-23 (×2): 20 meq via ORAL
  Filled 2019-11-22 (×2): qty 1

## 2019-11-22 MED ORDER — DIPHENHYDRAMINE HCL 25 MG PO CAPS
25.0000 mg | ORAL_CAPSULE | Freq: Once | ORAL | Status: AC
  Administered 2019-11-22: 25 mg via ORAL
  Filled 2019-11-22: qty 1

## 2019-11-22 MED ORDER — SODIUM CHLORIDE 0.9% IV SOLUTION: Freq: Once | INTRAVENOUS | Status: AC

## 2019-11-22 NOTE — Progress Notes (Signed)
Dr. Kennon Rounds notified that 2nd lactic acid have not been drawn because blood is still infusing via port A cath and patient does not want peripheral stick, Dr. Kennon Rounds notified.

## 2019-11-22 NOTE — Progress Notes (Signed)
[facility]ST consult to draw 2nd lactic acid from PAC.Currently blood infusing via PAC.Patient requests lab draw from [facility] rather than peripheral stick.Primary RN aware and to notify IV team when blood transfusion is finished to draw lab.

## 2019-11-22 NOTE — Progress Notes (Signed)
  Echocardiogram 2D Echocardiogram has been performed.  Mario Harmon A Markan Cazarez 11/22/2019, 10:55 AM

## 2019-11-22 NOTE — Progress Notes (Signed)
Patient ID: Mario Harmon, male   DOB: 16-Jan-1946, 74 y.o.   MRN: [id number]  PROGRESS NOTE    Mario Harmon  YIF:[id number] DOB: 1946-02-12 DOA: 72/29/2021 PCP: Patient, No Pcp Per    Brief Narrative:  ECHO ALLSBROOK is a 74 y.o. M with hx mesothelioma on chemo with carboplatinum and bevacizumab who presents with several days leg swelling, now orthopnea and paroxysmal nocturnal dyspnea.  About a week ago the patient started to notice some swelling in his legs that was new.  He also noted shortness of breath with exertion that was worse than his baseline, as well as progressive orthopnea.  Then this morning, the patient woke up very short of breath, called 911.  In the ER, afebrile, heart rate 116, respirations up to 31.  Oxygen saturation normal.  Chest x-ray was hard to interpret with his mesothelioma, but CT chest showed bilateral edema and BNP was greater than 3000.  He was started on Lasix and admitted.    Assessment & Plan:   Principal Problem:   Acute CHF (congestive heart failure) (HCC) Active Problems:   Malignant pleural mesothelioma (HCC)   Anemia associated with chemotherapy   New onset congestive heart failure -Furosemide 40 mg IV twice a day  -Strict I/Os, daily weights, telemetry  -Daily monitoring renal function -Obtain echocardiogram - EF is 86-76%, no diastolic dysfunction  Mesothelioma Dr. Earlie Server was added to the treatment team, no specific consult was requested. Case discussed with Dr. Benay Spice. May be worsening lung issue as CT appears somewhat worsened.  Anemia due to chemotherapy Will give 1 unit PRBCs and see if this helps.  DVT prophylaxis: Lovenox SQ Code Status: Full code  Family Communication: Wife at bedside Disposition Plan: Home likely tomorrow after transfusion   Consultants:   none  Procedures:  1 u PRBCs  Antimicrobials: Anti-infectives (From admission, onward)   None       Subjective: Feels slightly better.  I/Os are net negative.  Objective: Vitals:   11/21/19 2204 11/22/19 0201 11/22/19 0600 11/22/19 1022  BP: (!) 147/86 (!) 156/86 (!) 154/88 (!) 157/87  Pulse: 93 90 91 92  Resp: 20 18 18 14   Temp: 98.2 F (36.8 C) 98.4 F (36.9 C) 98.3 F (36.8 C) 98.2 F (36.8 C)  TempSrc: Oral Oral Oral Oral  SpO2: 98% 96% 97% 95%  Weight:   52.1 kg   Height:        Intake/Output Summary (Last 24 hours) at 11/22/2019 1214 Last data filed at 11/22/2019 1045 Gross per 24 hour  Intake 120 ml  Output 1640 ml  Net -1520 ml   Filed Weights   11/21/19 0809 11/22/19 0600  Weight: 60.2 kg 52.1 kg    Examination:  General exam: Appears chronically ill, increased respiratory rate Respiratory system: mildly tachypneic, diminished lung sounds Cardiovascular system: S1 & S2 heard, RRR.  Gastrointestinal system: Abdomen is nondistended, soft and nontender.  Central nervous system: Alert and oriented. No focal neurological deficits. Extremities: Symmetric 2+ edema Skin: No rashes Psychiatry: Judgement and insight appear normal. Mood & affect appropriate.   Data Reviewed: I have personally reviewed following labs and imaging studies  CBC: Recent Labs  Lab 11/16/19 1216 11/21/19 0916 11/22/19 0821  WBC 8.1 6.9 3.7*  NEUTROABS 7.1 6.4  --   HGB 8.2* 7.6* 8.7*  HCT 26.2* 24.5* 27.6*  MCV 95.3 97.6 96.2  PLT 251 180 720   Basic Metabolic Panel: Recent Labs  Lab 11/16/19 1216 11/21/19 0916  11/22/19 0821  NA 142 138 137  K 4.4 4.1 3.4*  CL 101 98 94*  CO2 31 28 28   GLUCOSE 118* 109* 111*  BUN 15 14 14   CREATININE 0.88 0.68 0.68  CALCIUM 9.7 8.8* 9.1  MG 1.7  --   --    GFR: Estimated Creatinine Clearance: 62.4 mL/min (by C-G formula based on SCr of 0.68 mg/dL). Liver Function Tests: Recent Labs  Lab 11/16/19 1216 11/21/19 0916  AST 19 17  ALT 24 26  ALKPHOS 111 72  BILITOT <0.2* 0.7  PROT 6.5 5.5*  ALBUMIN 2.5* 2.3*     Recent Results (from the past 240 hour(s))  SARS  Coronavirus 2 by RT PCR (hospital order, performed in [facility] hospital lab) Nasopharyngeal Nasopharyngeal Swab     Status: None   Collection Time: 11/21/19 12:46 PM   Specimen: Nasopharyngeal Swab  Result Value Ref Range Status   SARS Coronavirus 2 NEGATIVE NEGATIVE Final    Comment: (NOTE) SARS-CoV-2 target nucleic acids are NOT DETECTED. The SARS-CoV-2 RNA is generally detectable in upper and lower respiratory specimens during the acute phase of infection. The lowest concentration of SARS-CoV-2 viral copies this assay can detect is 250 copies / mL. A negative result does not preclude SARS-CoV-2 infection and should not be used as the sole basis for treatment or other patient management decisions.  A negative result may occur with improper specimen collection / handling, submission of specimen other than nasopharyngeal swab, presence of viral mutation(s) within the areas targeted by this assay, and inadequate number of viral copies (<250 copies / mL). A negative result must be combined with clinical observations, patient history, and epidemiological information. Fact Sheet for Patients:   StrictlyIdeas.no Fact Sheet for Healthcare Providers: BankingDealers.co.za This test is not yet approved or cleared  by the [place] FDA and has been authorized for detection and/or diagnosis of SARS-CoV-2 by FDA under an Emergency Use Authorization (EUA).  This EUA will remain in effect (meaning this test can be used) for the duration of the COVID-19 declaration under Section 564(b)(1) of the Act, 21 U.S.C. section 360bbb-3(b)(1), unless the authorization is terminated or revoked sooner. Performed at [facility], [place] [address], [place], [place] [address]       Radiology Studies: DG Chest 2 View  Result Date: 11/21/2019 CLINICAL DATA:  History of mesothelioma and shortness of breath EXAM: CHEST - 2 VIEW COMPARISON:   10/23/2019 FINDINGS: Cardiac shadow is stable. Lungs are well aerated bilaterally. Left chest wall port is noted in satisfactory position. Persistent pleural thickening is noted laterally on the right although slightly decreased when compare with the prior exam. Persistent small right pleural effusion is noted. These changes are consistent with the given clinical history of mesothelioma. No acute bony abnormality is noted. IMPRESSION: Pleural thickening and pleural effusion on the right consistent with the given clinical history of mesothelioma. No acute abnormality is noted. Electronically Signed   By: Inez Catalina M.D.   On: 11/21/2019 10:08   CT Angio Chest PE W and/or Wo Contrast  Result Date: 11/21/2019 CLINICAL DATA:  Acute onset shortness of breath, tachypnea, and tachycardia. Undergoing chemotherapy for malignant mesothelioma. EXAM: CT ANGIOGRAPHY CHEST WITH CONTRAST TECHNIQUE: Multidetector CT imaging of the chest was performed using the standard protocol during bolus administration of intravenous contrast. Multiplanar CT image reconstructions and MIPs were obtained to evaluate the vascular anatomy. CONTRAST:  166mL OMNIPAQUE IOHEXOL 350 MG/ML SOLN COMPARISON:  10/23/2019 FINDINGS: Cardiovascular: Satisfactory opacification  of pulmonary arteries noted, and no pulmonary emboli identified. No evidence of thoracic aortic dissection or aneurysm. Mediastinum/Nodes: Anterior mediastinal mass along the right anterior heart border measures 4.1 x 2.7 cm on image 170/7 compared to 3.5 x 2.4 cm when measured in same planes on previous study. Right internal mammary chain lymphadenopathy measures 1.4 cm short axis on image 112/7, compared to 1.5 cm previously. No new masses or lymphadenopathy identified. Lungs/Pleura: Diffuse lobulated right pleural soft tissue density and small amount of fluid shows no significant change compared to prior exam. New tiny left pleural effusion is seen. Mild thickening of interlobular  septi and hazy ground-glass opacity in both lower lobes is increased and may be due to mild interstitial edema or pneumonitis. Multiple small pulmonary nodules throughout the left lung show mild increase in size since previous study. One index nodule in the anterior left upper lobe currently measures 10 mm on image 95/12, compared to 6 mm previously. A new 4 mm pulmonary nodule seen in the anterior right upper lobe on image 37/12. Upper abdomen: A 2.1 by 1.7 cm low-attenuation lesion in the liver adjacent to the gallbladder fundus kidney seen on previous study and shows no significant change. This is indeterminate. Musculoskeletal: Erosion of the right lateral 4th, 5th, and 6th ribs by the adjacent pleural soft tissue density shows no significant change. Review of the MIP images confirms the above findings. IMPRESSION: 1. No evidence of pulmonary embolism. 2. New tiny left pleural effusion. Increased thickening of interlobular septi and hazy ground-glass opacity in both lower lobes, suspicious for mild interstitial edema or pneumonitis. 3. Mild increase in size of multiple left pulmonary nodules, consistent with metastatic disease. 4. No significant change in diffuse lobulated right pleural soft tissue density, consistent with known malignant mesothelioma. Stable erosion of right lateral 4th, 5th, and 6th ribs. 5. Mild increase in size of anterior mediastinal mass/lymphadenopathy. Stable mild right internal mammary chain lymphadenopathy. 6. Stable indeterminate low-attenuation lesion in the liver, adjacent to the gallbladder fundus. Electronically Signed   By: Marlaine Hind M.D.   On: 11/21/2019 12:28     Scheduled Meds: . sodium chloride   Intravenous Once  . acetaminophen  650 mg Oral Once  . bisacodyl  5 mg Oral BID WC  . Chlorhexidine Gluconate Cloth  6 each Topical Daily  . diphenhydrAMINE  25 mg Oral Once  . enoxaparin (LOVENOX) injection  40 mg Subcutaneous Q24H  . finasteride  5 mg Oral Daily  .  folic acid  1 mg Oral Daily  . furosemide  20 mg Intravenous Once  . furosemide  40 mg Intravenous BID  . morphine  60 mg Oral Q12H  . pantoprazole  40 mg Oral Daily  . polycarbophil  625 mg Oral BID  . potassium chloride  20 mEq Oral BID  . tamsulosin  0.4 mg Oral Daily   Continuous Infusions:   LOS: 1 day    Donnamae Jude, MD 11/22/2019 12:14 PM [phone] [facility] If 7PM-7AM, please contact night-coverage 11/22/2019, 12:14 PM

## 2019-11-23 LAB — CBC WITH DIFFERENTIAL/PLATELET
Abs Immature Granulocytes: 0.07 10*3/uL (ref 0.00–0.07)
Basophils Absolute: 0 10*3/uL (ref 0.0–0.1)
Basophils Relative: 0 %
Eosinophils Absolute: 0.1 10*3/uL (ref 0.0–0.5)
Eosinophils Relative: 2 %
HCT: 27.2 % — ABNORMAL LOW (ref 39.0–52.0)
Hemoglobin: 8.9 g/dL — ABNORMAL LOW (ref 13.0–17.0)
Immature Granulocytes: 2 %
Lymphocytes Relative: 5 %
Lymphs Abs: 0.1 10*3/uL — ABNORMAL LOW (ref 0.7–4.0)
MCH: 30.8 pg (ref 26.0–34.0)
MCHC: 32.7 g/dL (ref 30.0–36.0)
MCV: 94.1 fL (ref 80.0–100.0)
Monocytes Absolute: 0.3 10*3/uL (ref 0.1–1.0)
Monocytes Relative: 8 %
Neutro Abs: 2.6 10*3/uL (ref 1.7–7.7)
Neutrophils Relative %: 83 %
Platelets: 148 10*3/uL — ABNORMAL LOW (ref 150–400)
RBC: 2.89 MIL/uL — ABNORMAL LOW (ref 4.22–5.81)
RDW: 17.2 % — ABNORMAL HIGH (ref 11.5–15.5)
WBC: 3.1 10*3/uL — ABNORMAL LOW (ref 4.0–10.5)
nRBC: 0 % (ref 0.0–0.2)

## 2019-11-23 LAB — BPAM RBC
Blood Product Expiration Date: 202106062359
ISSUE DATE / TIME: 202105301637
Unit Type and Rh: 6200

## 2019-11-23 LAB — BASIC METABOLIC PANEL
Anion gap: 13 (ref 5–15)
BUN: 17 mg/dL (ref 8–23)
CO2: 33 mmol/L — ABNORMAL HIGH (ref 22–32)
Calcium: 8.6 mg/dL — ABNORMAL LOW (ref 8.9–10.3)
Chloride: 92 mmol/L — ABNORMAL LOW (ref 98–111)
Creatinine, Ser: 0.78 mg/dL (ref 0.61–1.24)
GFR calc Af Amer: >60 mL/min (ref >60)
GFR calc non Af Amer: >60 mL/min (ref >60)
Glucose, Bld: 105 mg/dL — ABNORMAL HIGH (ref 70–99)
Potassium: 3.7 mmol/L (ref 3.5–5.1)
Sodium: 138 mmol/L (ref 135–145)

## 2019-11-23 LAB — TYPE AND SCREEN
ABO/RH(D): A POS
Antibody Screen: NEGATIVE
Unit division: 0

## 2019-11-23 MED ORDER — HEPARIN SOD (PORK) LOCK FLUSH 100 UNIT/ML IV SOLN
500.0000 [IU] | INTRAVENOUS | Status: AC | PRN
  Administered 2019-11-23: 500 [IU]
  Filled 2019-11-23: qty 5

## 2019-11-23 MED ORDER — POTASSIUM CHLORIDE CRYS ER 20 MEQ PO TBCR: 20.0000 meq | EXTENDED_RELEASE_TABLET | Freq: Two times a day (BID) | ORAL | 1 refills | Status: DC

## 2019-11-23 MED ORDER — FUROSEMIDE 20 MG PO TABS
20.0000 mg | ORAL_TABLET | Freq: Two times a day (BID) | ORAL | 1 refills | Status: DC
Start: 2019-11-23 — End: 2019-12-16

## 2019-11-23 MED ORDER — IBUPROFEN 600 MG PO TABS
600.0000 mg | ORAL_TABLET | Freq: Four times a day (QID) | ORAL | 1 refills | Status: AC | PRN
Start: 2019-11-23 — End: ?

## 2019-11-23 NOTE — Discharge Instructions (Signed)
Managing Cancer Pain Cancer pain is different for everyone. It is important to work with your cancer care team to develop a pain management plan that is best for you. There are many options for pain control, and cancer pain can usually be managed with the right plan. Making lifestyle changes and following home care instructions from your cancer care team are also important parts of your plan. This can improve your quality of life while managing cancer pain. How to manage lifestyle changes Managing cancer pain can be stressful and exhausting. You may find that it is harder to control your pain when your levels of stress and exhaustion increase. Making lifestyle changes may help you lower stress and manage cancer pain. Managing stress   Try stress reduction techniques. These may lower stress and anxiety and take your mind off your pain. Ask your cancer care team for advice on good options. You may want to try: ? Guided relaxation. ? Meditation. ? Self-hypnosis. ? Deep breathing. ? Gentle yoga. ? Tai chi. ? Massage.  Make sure you have a good support system of friends, family, and other care providers. Use your support system for emotional support and for help with daily chores and activities.  Consider joining a cancer support group. Ask your cancer care team to recommend one in your area.  Do not use drugs or alcohol to manage stress. This can make both stress and pain worse. Avoiding exhaustion  Eat a healthy diet. Work with your cancer care team to come up with a nutritious diet that is best for you.  Plan activities for times when you are most rested and your pain is best controlled. This will help you avoid becoming physically exhausted.  Get enough sleep. Being overtired can increase pain. Tell your cancer care team if you are having trouble sleeping. Follow these instructions at home: Medicines  Take over-the-counter and prescription medicines only as told by your health care  provider.  Take your pain medicine on a regular schedule before pain becomes too severe.  You may have medicine for pain that happens in between doses of your usual pain control medicine (breakthrough pain). Do not wait until breakthrough pain is severe before taking the designated medicine.  Do not stop or reduce your pain medicine before talking to your cancer care team.  Do not crush or break pain pills unless your health care provider says you can.  Keep a 1-week supply of pain medicine on hand. Store your medicine safely away from children and pets.  Keep a complete list of all your medicines. Activity  Return to your normal activities as told by your health care provider. Ask your health care provider what activities are safe for you.  Do exercises as told by your health care provider. Increase your activity level as your pain is relieved.  Do not drive or use heavy machinery while taking prescription pain medicine. Managing constipation You may need to take actions to prevent or treat constipation, such as:  Drink enough fluid to keep your urine pale yellow.  Take over-the-counter or prescription medicines.  Eat foods that are high in fiber, such as beans, whole grains, and fresh fruits and vegetables.  Limit foods that are high in fat and processed sugars, such as fried or sweet foods. Alcohol use  Do not drink alcohol if: ? Your health care provider tells you not to drink. ? You are pregnant, may be pregnant, or are planning to become pregnant.  If you drink alcohol: ?  Limit how much you use to:  0-1 drink a day for women.  0-2 drinks a day for men. ? Be aware of how much alcohol is in your drink. In the [place] one drink equals one 12 oz bottle of beer (355 mL), one 5 oz glass of wine (148 mL), or one 1 oz glass of hard liquor (44 mL). General instructions   Ask your cancer care team about keeping a pain diary. This may help your care team adjust your pain  control plan as needed.  Ask to meet with a pain specialist who can help create a plan of care that works well for you.  Use gentle creams and lotions to keep your skin moist.  Do not use any products that contain nicotine or tobacco, such as cigarettes, e-cigarettes, and chewing tobacco. If you need help quitting, ask your health care provider.  Keep all follow-up visits as told by your health care provider. This is important. Where to find more information  American Cancer Society: www.cancer.[place]: www.cancer.gov Contact a health care provider if:  Your pain medicine is not controlling your pain.  You have trouble sleeping.  You feel depressed or anxious.  Your pain medicine is making you nauseous, sleepy, dizzy, or constipated.  You are unable to manage your pain at home. Summary  Cancer pain is different for everyone. Work with your cancer care team to develop the pain management plan that is best for you.  Making lifestyle changes and following home care instructions can improve your quality of life while managing cancer pain.  Lifestyle changes include eating a nutritious diet, getting regular exercise, and managing stress.  Carefully follow instructions for taking your medicine as told by your cancer care team.  Let your cancer care team know if your pain is not controlled at home. This information is not intended to replace advice given to you by your health care provider. Make sure you discuss any questions you have with your health care provider. Document Revised: 03/04/2019 Document Reviewed: 02/13/2018 Elsevier Patient Education  2020 [employer].

## 2019-11-23 NOTE — Discharge Summary (Signed)
Physician Discharge Summary  [name] HCW:[id number] DOB: 03/09/1946 DOA: 11/21/2019  PCP: Patient, No Pcp Per  Admit date: 11/21/2019 Discharge date: 11/23/2019  Admitted From: home Disposition:  home  Recommendations for Outpatient Follow-up:  1. Palliative care through the cancer center  Home Health: No Equipment/Devices:None   Discharge Condition: Fair CODE STATUS: Full code Diet recommendation: No restrictions  Brief/Interim Summary: Mario Harmon a 74 y.o.Mwith hx mesothelioma on chemo withcarboplatinum and bevacizumabwho presents with several days leg swelling, now orthopnea and paroxysmal nocturnal dyspnea.  About a week ago the patient started to notice some swelling in his legs that was new. He also noted shortness of breath with exertion that was worse than his baseline, as well as progressive orthopnea. Then this morning, the patient woke up very short of breath, called 911.  In the ER, afebrile, heart rate 116, respirations up to 31. Oxygen saturation normal. Chest x-ray was hard to interpret with his mesothelioma, but CT chest showed bilateral edema and BNP was greater than 3000. He was started on Lasix and admitted.  Discharge Diagnoses:  Principal Problem:   Acute CHF (congestive heart failure) (HCC) Active Problems:   Malignant pleural mesothelioma (HCC)   Anemia associated with chemotherapy   New onset congestive heart failure -Furosemide40 mgIV twice a day -switched to po BID at discharge -Strict I/Os, daily weights, telemetry lost 2.9 L during admission -Obtain echocardiogram - EF is 17-61%, no diastolic dysfunction  Mesothelioma Dr. Earlie Server was added to the treatment team, no specific consult was requested. Case discussed with Dr. Benay Spice. May be worsening lung issue as CT appears somewhat worsened. Will refer to palliative care at the Cancer center, message sent  Anemia due to chemotherapy Will give 1 unit PRBCs discharge  hgb stable at 8.9.  Discharge Instructions:  Discharge Instructions    Call MD for:  persistant nausea and vomiting   Complete by: As directed    Call MD for:  severe uncontrolled pain   Complete by: As directed    Call MD for:  temperature >100.4   Complete by: As directed    Diet general   Complete by: As directed    Increase activity slowly   Complete by: As directed      Allergies as of 11/23/2019   No Known Allergies     Medication List    TAKE these medications   bisacodyl 5 MG EC tablet Commonly known as: DULCOLAX Take 5 mg by mouth daily as needed for moderate constipation.   dexamethasone 4 MG tablet Commonly known as: DECADRON 1 tablet p.o. twice daily the day before, day of and day after chemotherapy every 3 weeks.   finasteride 5 MG tablet Commonly known as: PROSCAR Take 5 mg by mouth daily.   folic acid 1 MG tablet Commonly known as: FOLVITE TAKE 1 TABLET BY MOUTH EVERY DAY   furosemide 20 MG tablet Commonly known as: Lasix Take 1 tablet (20 mg total) by mouth 2 (two) times daily.   ibuprofen 600 MG tablet Commonly known as: ADVIL Take 1 tablet (600 mg total) by mouth every 6 (six) hours as needed. Alternate with oxycodone   lidocaine-prilocaine cream Commonly known as: EMLA Apply 1 application topically as needed. Apply small amount to cotton ball and apply to skin over port site at lease 1 hour prior to chemotherapy. Do not rub in and cover plastic wrap.   morphine 60 MG 12 hr tablet Commonly known as: MS CONTIN Take 1 tablet (60  mg total) by mouth every 12 (twelve) hours.   oxyCODONE 5 MG immediate release tablet Commonly known as: Oxy IR/ROXICODONE Take 1 tablet (5 mg total) by mouth every 6 (six) hours as needed for severe pain.   pantoprazole 40 MG tablet Commonly known as: PROTONIX TAKE 1 TABLET BY MOUTH PRIOR TO BREAKFAST What changed:   how much to take  how to take this  when to take this  additional instructions    polycarbophil 625 MG tablet Commonly known as: FIBERCON Take 625 mg by mouth daily.   potassium chloride SA 20 MEQ tablet Commonly known as: KLOR-CON Take 1 tablet (20 mEq total) by mouth 2 (two) times daily.   prochlorperazine 10 MG tablet Commonly known as: COMPAZINE TAKE 1 TABLET (10 MG TOTAL) BY MOUTH EVERY 6 (SIX) HOURS AS NEEDED FOR NAUSEA OR VOMITING.   tamsulosin 0.4 MG Caps capsule Commonly known as: FLOMAX Take 1 capsule (0.4 mg total) by mouth daily.      Follow-up Information    Curt Bears, MD Follow up.   Specialty: Oncology Contact information: [place] [address] [phone]          No Known Allergies  Consultations:  None   Procedures/Studies: DG Chest 2 View  Result Date: 11/21/2019 CLINICAL DATA:  History of mesothelioma and shortness of breath EXAM: CHEST - 2 VIEW COMPARISON:  10/23/2019 FINDINGS: Cardiac shadow is stable. Lungs are well aerated bilaterally. Left chest wall port is noted in satisfactory position. Persistent pleural thickening is noted laterally on the right although slightly decreased when compare with the prior exam. Persistent small right pleural effusion is noted. These changes are consistent with the given clinical history of mesothelioma. No acute bony abnormality is noted. IMPRESSION: Pleural thickening and pleural effusion on the right consistent with the given clinical history of mesothelioma. No acute abnormality is noted. Electronically Signed   By: Inez Catalina M.D.   On: 11/21/2019 10:08   CT Angio Chest PE W and/or Wo Contrast  Result Date: 11/21/2019 CLINICAL DATA:  Acute onset shortness of breath, tachypnea, and tachycardia. Undergoing chemotherapy for malignant mesothelioma. EXAM: CT ANGIOGRAPHY CHEST WITH CONTRAST TECHNIQUE: Multidetector CT imaging of the chest was performed using the standard protocol during bolus administration of intravenous contrast. Multiplanar CT image  reconstructions and MIPs were obtained to evaluate the vascular anatomy. CONTRAST:  147m OMNIPAQUE IOHEXOL 350 MG/ML SOLN COMPARISON:  10/23/2019 FINDINGS: Cardiovascular: Satisfactory opacification of pulmonary arteries noted, and no pulmonary emboli identified. No evidence of thoracic aortic dissection or aneurysm. Mediastinum/Nodes: Anterior mediastinal mass along the right anterior heart border measures 4.1 x 2.7 cm on image 170/7 compared to 3.5 x 2.4 cm when measured in same planes on previous study. Right internal mammary chain lymphadenopathy measures 1.4 cm short axis on image 112/7, compared to 1.5 cm previously. No new masses or lymphadenopathy identified. Lungs/Pleura: Diffuse lobulated right pleural soft tissue density and small amount of fluid shows no significant change compared to prior exam. New tiny left pleural effusion is seen. Mild thickening of interlobular septi and hazy ground-glass opacity in both lower lobes is increased and may be due to mild interstitial edema or pneumonitis. Multiple small pulmonary nodules throughout the left lung show mild increase in size since previous study. One index nodule in the anterior left upper lobe currently measures 10 mm on image 95/12, compared to 6 mm previously. A new 4 mm pulmonary nodule seen in the anterior right upper lobe on image 37/12.  Upper abdomen: A 2.1 by 1.7 cm low-attenuation lesion in the liver adjacent to the gallbladder fundus kidney seen on previous study and shows no significant change. This is indeterminate. Musculoskeletal: Erosion of the right lateral 4th, 5th, and 6th ribs by the adjacent pleural soft tissue density shows no significant change. Review of the MIP images confirms the above findings. IMPRESSION: 1. No evidence of pulmonary embolism. 2. New tiny left pleural effusion. Increased thickening of interlobular septi and hazy ground-glass opacity in both lower lobes, suspicious for mild interstitial edema or pneumonitis. 3.  Mild increase in size of multiple left pulmonary nodules, consistent with metastatic disease. 4. No significant change in diffuse lobulated right pleural soft tissue density, consistent with known malignant mesothelioma. Stable erosion of right lateral 4th, 5th, and 6th ribs. 5. Mild increase in size of anterior mediastinal mass/lymphadenopathy. Stable mild right internal mammary chain lymphadenopathy. 6. Stable indeterminate low-attenuation lesion in the liver, adjacent to the gallbladder fundus. Electronically Signed   By: Marlaine Hind M.D.   On: 11/21/2019 12:28   ECHOCARDIOGRAM COMPLETE  Result Date: 11/22/2019    ECHOCARDIOGRAM REPORT   Patient Name:   Mario Harmon Date of Exam: 11/22/2019 Medical Rec #:  [id number]        Height:       68.0 in Accession #:    [id number]       Weight:       114.8 lb Date of Birth:  1945/10/17       BSA:          1.614 m Patient Age:    34 years         BP:           157/87 mmHg Patient Gender: M                HR:           92 bpm. Exam Location:  Inpatient Procedure: 2D Echo Indications:    CHF-Acute Diastolic 801.65 / V37.48  History:        Patient has no prior history of Echocardiogram examinations.                 CHF. History of pleural effusion.  Sonographer:    Vikki Ports Turrentine Referring Phys: [id number] CHRISTOPHER P DANFORD  Sonographer Comments: Technically difficult apical window due to small rib spacing. IMPRESSIONS  1. Left ventricular ejection fraction, by estimation, is 55 to 60%. The left ventricle has normal function. The left ventricle has no regional wall motion abnormalities. Left ventricular diastolic parameters were normal.  2. Right ventricular systolic function is normal. The right ventricular size is normal. Tricuspid regurgitation signal is inadequate for assessing PA pressure.  3. The mitral valve is grossly normal. Trivial mitral valve regurgitation.  4. The aortic valve is tricuspid. Aortic valve regurgitation is not visualized.  5. The  inferior vena cava is normal in size with greater than 50% respiratory variability, suggesting right atrial pressure of 3 mmHg.  6. Small anterior and trivial posterior pericardial effusion. FINDINGS  Left Ventricle: Left ventricular ejection fraction, by estimation, is 55 to 60%. The left ventricle has normal function. The left ventricle has no regional wall motion abnormalities. The left ventricular internal cavity size was normal in size. There is  no left ventricular hypertrophy. Left ventricular diastolic parameters were normal. Right Ventricle: The right ventricular size is normal. No increase in right ventricular wall thickness. Right ventricular systolic function is normal. Tricuspid regurgitation signal is  inadequate for assessing PA pressure. Left Atrium: Left atrial size was normal in size. Right Atrium: Right atrial size was normal in size. Pericardium: A small pericardial effusion is present. The pericardial effusion is anterior to the right ventricle. Mitral Valve: The mitral valve is grossly normal. Trivial mitral valve regurgitation. Tricuspid Valve: The tricuspid valve is grossly normal. Tricuspid valve regurgitation is trivial. Aortic Valve: The aortic valve is tricuspid. Aortic valve regurgitation is not visualized. Pulmonic Valve: The pulmonic valve was grossly normal. Pulmonic valve regurgitation is trivial. Aorta: The aortic root is normal in size and structure. Venous: The inferior vena cava is normal in size with greater than 50% respiratory variability, suggesting right atrial pressure of 3 mmHg. IAS/Shunts: No atrial level shunt detected by color flow Doppler.  LEFT VENTRICLE PLAX 2D LVIDd:         4.50 cm  Diastology LVIDs:         3.10 cm  LV e' lateral:   10.10 cm/s LV PW:         0.70 cm  LV E/e' lateral: 6.7 LV IVS:        0.70 cm  LV e' medial:    8.38 cm/s LVOT diam:     1.60 cm  LV E/e' medial:  8.0 LV SV:         35 LV SV Index:   22 LVOT Area:     2.01 cm  RIGHT VENTRICLE RV S  prime:     12.20 cm/s TAPSE (M-mode): 1.6 cm LEFT ATRIUM           Index       RIGHT ATRIUM           Index LA diam:      3.00 cm 1.86 cm/m  RA Area:     12.30 cm LA Vol (A4C): 18.2 ml 11.27 ml/m RA Volume:   28.00 ml  17.35 ml/m  AORTIC VALVE LVOT Vmax:   96.60 cm/s LVOT Vmean:  69.600 cm/s LVOT VTI:    0.176 m  AORTA Ao Root diam: 3.30 cm MITRAL VALVE MV Area (PHT): 3.42 cm    SHUNTS MV Decel Time: 222 msec    Systemic VTI:  0.18 m MV E velocity: 67.30 cm/s  Systemic Diam: 1.60 cm MV A velocity: 74.60 cm/s MV E/A ratio:  0.90 Rozann Lesches MD Electronically signed by Rozann Lesches MD Signature Date/Time: 11/22/2019/1:06:05 PM    Final    IR IMAGING GUIDED PORT INSERTION  Result Date: 11/06/2019 INDICATION: History of right-sided malignant mesothelioma. Please perform Port a catheter placement for durable intravenous access for chemotherapy administration. EXAM: IMPLANTED PORT A CATH PLACEMENT WITH ULTRASOUND AND FLUOROSCOPIC GUIDANCE COMPARISON:  None. MEDICATIONS: Ancef 2 gm IV; The antibiotic was administered within an appropriate time interval prior to skin puncture. ANESTHESIA/SEDATION: Moderate (conscious) sedation was employed during this procedure. A total of Versed 2.5 mg and Fentanyl 100 mcg was administered intravenously. Moderate Sedation Time: 28 minutes. The patient's level of consciousness and vital signs were monitored continuously by radiology nursing throughout the procedure under my direct supervision. CONTRAST:  None FLUOROSCOPY TIME:  42 seconds (5 mGy) COMPLICATIONS: None immediate. PROCEDURE: The procedure, risks, benefits, and alternatives were explained to the patient. Questions regarding the procedure were encouraged and answered. The patient understands and consents to the procedure. Given the presence of the right-sided malignant mesothelioma put incision was made to place a left internal jugular approach port a catheter. As such, the left neck and chest  were prepped with  chlorhexidine in a sterile fashion, and a sterile drape was applied covering the operative field. Maximum barrier sterile technique with sterile gowns and gloves were used for the procedure. A timeout was performed prior to the initiation of the procedure. Local anesthesia was provided with 1% lidocaine with epinephrine. After creating a small venotomy incision, a micropuncture kit was utilized to access the internal jugular vein. Real-time ultrasound guidance was utilized for vascular access including the acquisition of a permanent ultrasound image documenting patency of the accessed vessel. The microwire was utilized to measure appropriate catheter length. A subcutaneous port pocket was then created along the upper chest wall utilizing a combination of sharp and blunt dissection. The pocket was irrigated with sterile saline. A single lumen "Slim" sized power injectable port was chosen for placement. The 8 Fr catheter was tunneled from the port pocket site to the venotomy incision. The port was placed in the pocket. The external catheter was trimmed to appropriate length. At the venotomy, an 8 Fr peel-away sheath was placed over a guidewire under fluoroscopic guidance. The catheter was then placed through the sheath and the sheath was removed. Final catheter positioning was confirmed and documented with a fluoroscopic spot radiograph. The port was accessed with a Huber needle, aspirated and flushed with heparinized saline. The venotomy site was closed with an interrupted 4-0 Vicryl suture. The port pocket incision was closed with interrupted 2-0 Vicryl suture. Dermabond and Steri-strips were applied to both incisions. Dressings were applied. The patient tolerated the procedure well without immediate post procedural complication. FINDINGS: After catheter placement, the tip lies within the superior cavoatrial junction. The catheter aspirates and flushes normally and is ready for immediate use. IMPRESSION: Successful  placement of a left internal jugular approach power injectable Port-A-Cath. The catheter is ready for immediate use. Electronically Signed   By: Sandi Mariscal M.D.   On: 11/06/2019 14:49       Subjective: Had an episode of SOB at 4 in the morning. Otherwise feels well.  Discharge Exam: Vitals:   11/22/19 2054 11/23/19 0516  BP: 136/82 (!) 145/92  Pulse: 88 96  Resp: 14 14  Temp: 97.8 F (36.6 C) 98.2 F (36.8 C)  SpO2: 95% 96%   Vitals:   11/22/19 1657 11/22/19 1932 11/22/19 2054 11/23/19 0516  BP: 140/88 (!) 152/89 136/82 (!) 145/92  Pulse: 87 95 88 96  Resp: '15 19 14 14  ' Temp: 98.6 F (37 C) 97.9 F (36.6 C) 97.8 F (36.6 C) 98.2 F (36.8 C)  TempSrc: Oral Axillary Oral Oral  SpO2: 97% 96% 95% 96%  Weight:    52.3 kg  Height:        General: Pt is alert, awake, chronically ill appearing, thin Cardiovascular: RRR, S1/S2 +, no rubs, no gallops Respiratory: tachypnic, diminished lung sounds Abdominal: Soft, NT, ND, bowel sounds + Extremities: no edema, no cyanosis    The results of significant diagnostics from this hospitalization (including imaging, microbiology, ancillary and laboratory) are listed below for reference.     Microbiology: Recent Results (from the past 240 hour(s))  SARS Coronavirus 2 by RT PCR (hospital order, performed in [facility] hospital lab) Nasopharyngeal Nasopharyngeal Swab     Status: None   Collection Time: 11/21/19 12:46 PM   Specimen: Nasopharyngeal Swab  Result Value Ref Range Status   SARS Coronavirus 2 NEGATIVE NEGATIVE Final    Comment: (NOTE) SARS-CoV-2 target nucleic acids are NOT DETECTED. The SARS-CoV-2 RNA is generally detectable in  upper and lower respiratory specimens during the acute phase of infection. The lowest concentration of SARS-CoV-2 viral copies this assay can detect is 250 copies / mL. A negative result does not preclude SARS-CoV-2 infection and should not be used as the sole basis for treatment or  other patient management decisions.  A negative result may occur with improper specimen collection / handling, submission of specimen other than nasopharyngeal swab, presence of viral mutation(s) within the areas targeted by this assay, and inadequate number of viral copies (<250 copies / mL). A negative result must be combined with clinical observations, patient history, and epidemiological information. Fact Sheet for Patients:   StrictlyIdeas.no Fact Sheet for Healthcare Providers: BankingDealers.co.za This test is not yet approved or cleared  by the [place] FDA and has been authorized for detection and/or diagnosis of SARS-CoV-2 by FDA under an Emergency Use Authorization (EUA).  This EUA will remain in effect (meaning this test can be used) for the duration of the COVID-19 declaration under Section 564(b)(1) of the Act, 21 U.S.C. section 360bbb-3(b)(1), unless the authorization is terminated or revoked sooner. Performed at [facility], [place] [address] [place], [place] [address]      Labs: BNP (last 3 results) Recent Labs    11/21/19 0916  BNP 3,403.7*   Basic Metabolic Panel: Recent Labs  Lab 11/16/19 1216 11/21/19 0916 11/22/19 0821 11/23/19 0315  NA 142 138 137 138  K 4.4 4.1 3.4* 3.7  CL 101 98 94* 92*  CO2 '31 28 28 ' 33*  GLUCOSE 118* 109* 111* 105*  BUN '15 14 14 17  ' CREATININE 0.88 0.68 0.68 0.78  CALCIUM 9.7 8.8* 9.1 8.6*  MG 1.7  --   --   --    Liver Function Tests: Recent Labs  Lab 11/16/19 1216 11/21/19 0916  AST 19 17  ALT 24 26  ALKPHOS 111 72  BILITOT <0.2* 0.7  PROT 6.5 5.5*  ALBUMIN 2.5* 2.3*   CBC: Recent Labs  Lab 11/16/19 1216 11/21/19 0916 11/22/19 0821 11/22/19 2202  WBC 8.1 6.9 3.7* 3.1*  NEUTROABS 7.1 6.4  --  2.6  HGB 8.2* 7.6* 8.7* 8.9*  HCT 26.2* 24.5* 27.6* 27.2*  MCV 95.3 97.6 96.2 94.1  PLT 251 180 162 148*   Urinalysis    Component Value  Date/Time   COLORURINE YELLOW 09/15/2019 1430   APPEARANCEUR CLEAR 09/15/2019 1430   LABSPEC 1.021 09/15/2019 1430   PHURINE 5.0 09/15/2019 1430   GLUCOSEU NEGATIVE 09/15/2019 1430   HGBUR SMALL (A) 09/15/2019 1430   BILIRUBINUR MODERATE (A) 09/15/2019 1430   KETONESUR NEGATIVE 09/15/2019 1430   PROTEINUR NEGATIVE 11/16/2019 1500   NITRITE NEGATIVE 09/15/2019 1430   LEUKOCYTESUR NEGATIVE 09/15/2019 1430   Microbiology Recent Results (from the past 240 hour(s))  SARS Coronavirus 2 by RT PCR (hospital order, performed in [facility] hospital lab) Nasopharyngeal Nasopharyngeal Swab     Status: None   Collection Time: 11/21/19 12:46 PM   Specimen: Nasopharyngeal Swab  Result Value Ref Range Status   SARS Coronavirus 2 NEGATIVE NEGATIVE Final    Comment: (NOTE) SARS-CoV-2 target nucleic acids are NOT DETECTED. The SARS-CoV-2 RNA is generally detectable in upper and lower respiratory specimens during the acute phase of infection. The lowest concentration of SARS-CoV-2 viral copies this assay can detect is 250 copies / mL. A negative result does not preclude SARS-CoV-2 infection and should not be used as the sole basis for treatment or other patient management decisions.  A negative  result may occur with improper specimen collection / handling, submission of specimen other than nasopharyngeal swab, presence of viral mutation(s) within the areas targeted by this assay, and inadequate number of viral copies (<250 copies / mL). A negative result must be combined with clinical observations, patient history, and epidemiological information. Fact Sheet for Patients:   StrictlyIdeas.no Fact Sheet for Healthcare Providers: BankingDealers.co.za This test is not yet approved or cleared  by the [place] FDA and has been authorized for detection and/or diagnosis of SARS-CoV-2 by FDA under an Emergency Use Authorization (EUA).  This EUA will  remain in effect (meaning this test can be used) for the duration of the COVID-19 declaration under Section 564(b)(1) of the Act, 21 U.S.C. section 360bbb-3(b)(1), unless the authorization is terminated or revoked sooner. Performed at [facility], [place] [address], [place], [place] [address]      Time coordinating discharge: Over 30 minutes  SIGNED:   Donnamae Jude, MD  [facility] 11/23/2019, 10:13 AM  If 7PM-7AM, please contact night-coverage

## 2019-11-23 NOTE — Progress Notes (Signed)
Discharge instructions given and explained to patient/wife, they verbalized understanding, patient denies any distress.No pressure injury noted.    Accompanied home by family.

## 2019-11-23 NOTE — Progress Notes (Signed)
Waiting on patient's wife to get here to review discharge instruction.

## 2019-11-24 ENCOUNTER — Inpatient Hospital Stay: Payer: Medicare Other

## 2019-11-24 ENCOUNTER — Telehealth: Payer: Self-pay | Admitting: Medical Oncology

## 2019-11-24 NOTE — Telephone Encounter (Signed)
Cancelled lab and flush appt for today . Just d/c from hospital .  He" received blood and had CT ".  Next appt in one week. I told pt to keep the appt for next week.

## 2019-11-25 ENCOUNTER — Other Ambulatory Visit: Payer: Self-pay | Admitting: Medical Oncology

## 2019-11-25 DIAGNOSIS — R112 Nausea with vomiting, unspecified: Secondary | ICD-10-CM

## 2019-11-25 MED ORDER — PANTOPRAZOLE SODIUM 40 MG PO TBEC: DELAYED_RELEASE_TABLET | ORAL | 1 refills | Status: DC

## 2019-11-25 NOTE — Telephone Encounter (Signed)
Protonix refill

## 2019-11-30 ENCOUNTER — Other Ambulatory Visit: Payer: Self-pay

## 2019-11-30 ENCOUNTER — Inpatient Hospital Stay: Payer: Medicare Other

## 2019-11-30 ENCOUNTER — Inpatient Hospital Stay: Payer: Medicare Other | Attending: Internal Medicine

## 2019-11-30 ENCOUNTER — Other Ambulatory Visit: Payer: Self-pay | Admitting: Medical Oncology

## 2019-11-30 ENCOUNTER — Other Ambulatory Visit: Payer: Self-pay | Admitting: Internal Medicine

## 2019-11-30 ENCOUNTER — Telehealth: Payer: Self-pay | Admitting: Medical Oncology

## 2019-11-30 DIAGNOSIS — I5031 Acute diastolic (congestive) heart failure: Secondary | ICD-10-CM | POA: Diagnosis not present

## 2019-11-30 DIAGNOSIS — J910 Malignant pleural effusion: Secondary | ICD-10-CM | POA: Diagnosis not present

## 2019-11-30 DIAGNOSIS — C7989 Secondary malignant neoplasm of other specified sites: Secondary | ICD-10-CM | POA: Insufficient documentation

## 2019-11-30 DIAGNOSIS — G893 Neoplasm related pain (acute) (chronic): Secondary | ICD-10-CM

## 2019-11-30 DIAGNOSIS — C771 Secondary and unspecified malignant neoplasm of intrathoracic lymph nodes: Secondary | ICD-10-CM | POA: Insufficient documentation

## 2019-11-30 DIAGNOSIS — R5383 Other fatigue: Secondary | ICD-10-CM | POA: Insufficient documentation

## 2019-11-30 DIAGNOSIS — C450 Mesothelioma of pleura: Secondary | ICD-10-CM | POA: Diagnosis not present

## 2019-11-30 DIAGNOSIS — C7951 Secondary malignant neoplasm of bone: Secondary | ICD-10-CM | POA: Diagnosis not present

## 2019-11-30 LAB — CBC WITH DIFFERENTIAL (CANCER CENTER ONLY)
Abs Immature Granulocytes: 0.02 10*3/uL (ref 0.00–0.07)
Basophils Absolute: 0 10*3/uL (ref 0.0–0.1)
Basophils Relative: 0 %
Eosinophils Absolute: 0.1 10*3/uL (ref 0.0–0.5)
Eosinophils Relative: 2 %
HCT: 26.3 % — ABNORMAL LOW (ref 39.0–52.0)
Hemoglobin: 8.4 g/dL — ABNORMAL LOW (ref 13.0–17.0)
Immature Granulocytes: 1 %
Lymphocytes Relative: 4 %
Lymphs Abs: 0.2 10*3/uL — ABNORMAL LOW (ref 0.7–4.0)
MCH: 31 pg (ref 26.0–34.0)
MCHC: 31.9 g/dL (ref 30.0–36.0)
MCV: 97 fL (ref 80.0–100.0)
Monocytes Absolute: 0.9 10*3/uL (ref 0.1–1.0)
Monocytes Relative: 21 %
Neutro Abs: 3.2 10*3/uL (ref 1.7–7.7)
Neutrophils Relative %: 72 %
Platelet Count: 75 10*3/uL — ABNORMAL LOW (ref 150–400)
RBC: 2.71 MIL/uL — ABNORMAL LOW (ref 4.22–5.81)
RDW: 17.2 % — ABNORMAL HIGH (ref 11.5–15.5)
WBC Count: 4.3 10*3/uL (ref 4.0–10.5)
nRBC: 0 % (ref 0.0–0.2)

## 2019-11-30 LAB — CMP (CANCER CENTER ONLY)
ALT: 15 U/L (ref 0–44)
AST: 15 U/L (ref 15–41)
Albumin: 2.4 g/dL — ABNORMAL LOW (ref 3.5–5.0)
Alkaline Phosphatase: 97 U/L (ref 38–126)
Anion gap: 11 (ref 5–15)
BUN: 17 mg/dL (ref 8–23)
CO2: 33 mmol/L — ABNORMAL HIGH (ref 22–32)
Calcium: 9.8 mg/dL (ref 8.9–10.3)
Chloride: 97 mmol/L — ABNORMAL LOW (ref 98–111)
Creatinine: 0.82 mg/dL (ref 0.61–1.24)
GFR, Est AFR Am: >60 mL/min (ref >60)
GFR, Estimated: >60 mL/min (ref >60)
Glucose, Bld: 99 mg/dL (ref 70–99)
Potassium: 4.4 mmol/L (ref 3.5–5.1)
Sodium: 141 mmol/L (ref 135–145)
Total Bilirubin: 0.4 mg/dL (ref 0.3–1.2)
Total Protein: 6.2 g/dL — ABNORMAL LOW (ref 6.5–8.1)

## 2019-11-30 LAB — MAGNESIUM: Magnesium: 1.4 mg/dL — CL (ref 1.7–2.4)

## 2019-11-30 MED ORDER — MAGNESIUM OXIDE 400 (241.3 MG) MG PO TABS: 400.0000 mg | ORAL_TABLET | Freq: Three times a day (TID) | ORAL | 0 refills | Status: AC

## 2019-11-30 MED ORDER — MORPHINE SULFATE ER 60 MG PO TBCR: 60.0000 mg | EXTENDED_RELEASE_TABLET | Freq: Two times a day (BID) | ORAL | 0 refills | Status: AC

## 2019-11-30 NOTE — Telephone Encounter (Signed)
Pain med refill -Morphine

## 2019-11-30 NOTE — Telephone Encounter (Signed)
Pain clinic appt 12/04/19 at  [facility] with Dr Huel Coventry near [facility].  I told pt his pain doctor will prescribe pain med after today.  Mag oxide rx sent to provider and  pharmacy.

## 2019-12-04 ENCOUNTER — Other Ambulatory Visit: Payer: Self-pay

## 2019-12-04 ENCOUNTER — Encounter
Payer: Medicare Other | Attending: Physical Medicine and Rehabilitation | Admitting: Physical Medicine and Rehabilitation

## 2019-12-04 ENCOUNTER — Encounter: Payer: Self-pay | Admitting: Physical Medicine and Rehabilitation

## 2019-12-04 VITALS — BP 109/74 | HR 114 | Temp 98.3°F | Ht 66.5 in | Wt 120.2 lb

## 2019-12-04 DIAGNOSIS — G893 Neoplasm related pain (acute) (chronic): Secondary | ICD-10-CM

## 2019-12-04 DIAGNOSIS — C450 Mesothelioma of pleura: Secondary | ICD-10-CM

## 2019-12-04 NOTE — Addendum Note (Signed)
Addended by: Izora Ribas on: 12/04/2019 03:05 PM   Modules accepted: Orders

## 2019-12-04 NOTE — Progress Notes (Addendum)
Subjective:    Patient ID: Mario Harmon, male    DOB: 25-Jun-1946, 74 y.o.   MRN: [id number]  HPI  Referring physician: Dr. Julien Nordmann. Cancer history from Dr. 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  PRIOR THERAPY: Systemic chemotherapy with cisplatin 75 mg/M2, Alimta 500 mg/M2 and Avastin 15 mg/KG every 3 weeks.  First dose July 29, 2019.  Status post 1 cycle.  This was discontinued secondary to intolerance  CURRENT THERAPY: Systemic chemotherapy with carboplatin for AUC of 5, Alimta 500 mg/M2 and Avastin 15 mg/KG every 3 weeks.  First dose September 11, 2019.  Status post 3 cycles.  12/04/19 history: Mario Harmon is a 74 year old man who presents with malignant mesothelioma related pain. Pain is severe. He is currently receiving 2 morphine tablets per day with oxycodone 5mg  as a breakthrough when necessary. This is still not controlling the pain. The medication helps but the pain is still 8/10. It feels dull, aching, stiff, and sore. It is worst in his right axilla and sometimes radiates down right ribs. The pain is best when he does no activity. It hurts to move at all times. This morning he also took Ibuprofen 600mg  which did help. He finds the Ibuprofen to be more helpful than oxycodone but is afraid to take too much. He only takes once per day and approximately one oxycodone per day.   He is overwhelmed by the amount of medications that he is currently taking. He sets alarms throughout the day to take his medications, waking himself up from sleep in order to do so.   His wife and he say the focus of his life is now on his pain and not on anything else. His pain is currently mitigated by the Ibuprofen he took  this morning.    He has not tried Lyrica, Cymbalta, Gabapentin, Amitriptyline. Has never had nerve block.  Pain Inventory Average Pain 8 Pain Right Now 6 My pain is dull, aching and stiff, sore  In the last 24 hours, has pain interfered with the following? General activity 7 Relation with others 0 Enjoyment of life 8 What TIME of day is your pain at its worst? varies Sleep (in general) Fair  Pain is worse with: bending Pain improves with: medication and inactive Relief from Meds: 3  Mobility walk without assistance how many minutes can you walk? 30-60 ability to climb steps?  yes do you drive?  yes  Function retired I need assistance with the following:  household duties  Neuro/Psych confusion depression anxiety  Family History  Problem Relation Age of Onset  . Esophageal cancer Neg Hx   . Colon cancer Neg Hx   . Pancreatic cancer Neg Hx   . Liver disease Neg Hx   . Inflammatory bowel disease Neg Hx   . Stomach cancer Neg Hx   . Ulcerative colitis Neg Hx    Social History   Socioeconomic History  . Marital status: Married    Spouse name: Not on file  . Number of children: Not on file  . Years of education: Not on file  . Highest education level: Not on file  Occupational History  . Occupation: retired    Comment: Mudlogger from [employer]  . Smoking status:  Former Smoker    Types: Pipe  . Smokeless tobacco: Never Used  . Tobacco comment: many years ago he smoked a pipe.  Vaping Use  . Vaping Use: Never used  Substance and Sexual Activity  . Alcohol use: Not Currently  . Drug use: Never  . Sexual activity: Not on file  Other Topics Concern  . Not on file  Social History Narrative   The patient is married and lives in [place]. He does not have any children. His sister also plays an active role in his care. 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   Social Determinants of Health   Financial Resource Strain:   . Difficulty of Paying Living Expenses:   Food Insecurity:   . Worried About Charity fundraiser in the Last Year:   . Arboriculturist in the Last Year:   Transportation Needs:   . Film/video editor (Medical):   Marland Kitchen Lack of Transportation (Non-Medical):   Physical Activity:   . Days of Exercise per Week:   . Minutes of Exercise per Session:   Stress:   . Feeling of Stress :   Social Connections:   . Frequency of Communication with Friends and Family:   . Frequency of Social Gatherings with Friends and Family:   . Attends Religious Services:   . Active Member of Clubs or Organizations:   . Attends Archivist Meetings:   Marland Kitchen Marital Status:    Past Surgical History:  Procedure Laterality Date  . IR IMAGING GUIDED PORT INSERTION  11/06/2019  . WRIST FRACTURE SURGERY Left    Past Medical History:  Diagnosis Date  . Mesothelioma ([place])    BP 109/74   Pulse (!) 114   Temp 98.3 F (36.8 C)   Ht 5' 6.5" (1.689 m)   Wt 120 lb 3.2 oz (54.5 kg)   SpO2 93%   BMI 19.11 kg/m   Opioid Risk Score:   Fall Risk Score:  `1  Depression screen PHQ 2/9  Depression screen PHQ 2/9 12/04/2019  Decreased Interest 1  Down, Depressed, Hopeless 0  PHQ - 2 Score 1  Altered sleeping 0  Tired, decreased energy 1  Change in appetite 1  Feeling bad or failure about yourself  1  Trouble concentrating 1  Moving slowly or fidgety/restless 1  Suicidal thoughts 0  PHQ-9 Score 6  Difficult doing work/chores Very difficult    Review of Systems  Gastrointestinal: Positive for constipation.  Psychiatric/Behavioral: Positive for confusion and dysphoric mood. The patient is nervous/anxious.   All other systems reviewed and are negative.      Objective:   Physical Exam Gen: no distress, normal appearing HEENT: oral mucosa pink and moist, NCAT Cardio: Reg rate Chest: normal effort,  normal rate of breathing Abd: soft, non-distended Ext: no edema Skin: intact Neuro: AOx3. Slow but appropriate speech MSK: patient points out pain location in right axilla Psych: pleasant, normal affect    Assessment & Plan:  Mario Harmon is a 74 year old man who presents to establish care for severe cancer-related pain from his malignant mesothelioma.   -Continue morphine 60mg  q12H for pain relief. Can hopefully slowly wean in future once pain is well controlled as patient does have opioid-induced constipation and is very stressed by the amount of medications he takes.  -Continue oxycodone 5mg  as needed for breakthrough pain. Currently taking once per day. Advised that he  can take more frequently if he needs and not to feel bad doing so.  -Continue Ibuprofen 600mg  as needed for breakthrough pain. Advised that he can take 800mg  TID as needed for breakthrough pain, Kidney function reviewed and wnl 5/31. Already on Protonix which he says helps his dry heaves- he is taking daily. Advised regarding side effects of kidney injury, GI ulcers, and HTN. Currently he has normal kidney function, is hypotensive (likely from opioids), and on GI prophylaxis so I think this would be a safe option for him. -Given focal location of pain in right axilla, he may be an excellent candidate for a nerve block. I will call anesthesia pain Dr. Johnny Bridge to discuss this option today and to refer him to her practice. Will call patient afterward to inform him of our conversation -Physical activity is severely limited. Goal is to control pain so he can have better quality of life. Advised that this is very feasible and we have many pain relief options to try. Currently it appears that a relatively safe option of Ibuprofen is effective for him and he can take more advantage of this option.  Polypharmacy: All medications reviewed. Patient is very concerned by amount of medications he takes and has set alarms to take them  throughout the day. Advised to just take morning and night as currently his day is regulated around his medications which he has spread to every few hours. Provided him with list of medications organized for morning and night.   Lower extremity edema: continue Lasix 20mg  BID. Check creatinine q38months. Patient has diagnosis of CHF. Not having increased urination at night. If does, advised to take 40 Lasix daily instead. Recommended establishing care with PCP.  Medication induced constipation: -Advised Colace TID and Senna HS. Last BM 1 week ago. He was agitated by thought of more medication, so instead advised daily intake of prunes and regular intake of oatmeal, fruits and other high fiber foods. Can continue fiber supplement and Dulcolax prn.   All questions answered.  >60 minutes spent in direct patient care including extensive counseling given regarding pain, treatment options, constipation, edema, medication review and reorganization, and in discussion with pain management anesthesia.

## 2019-12-04 NOTE — Patient Instructions (Addendum)
Medications to take in the morning: Lasix, Protonix, Morphine, Proscar, Flomax, potassium, magnesium, folic acid, fiber  Medications to take at night: Lasix, potassium, magnesium (2 pills)

## 2019-12-07 ENCOUNTER — Other Ambulatory Visit: Payer: Self-pay

## 2019-12-07 ENCOUNTER — Telehealth: Payer: Self-pay | Admitting: Medical Oncology

## 2019-12-07 ENCOUNTER — Inpatient Hospital Stay (HOSPITAL_BASED_OUTPATIENT_CLINIC_OR_DEPARTMENT_OTHER): Payer: Medicare Other | Admitting: Internal Medicine

## 2019-12-07 ENCOUNTER — Inpatient Hospital Stay: Payer: Medicare Other

## 2019-12-07 ENCOUNTER — Encounter: Payer: Self-pay | Admitting: Internal Medicine

## 2019-12-07 ENCOUNTER — Telehealth: Payer: Self-pay

## 2019-12-07 ENCOUNTER — Other Ambulatory Visit: Payer: Self-pay | Admitting: Medical Oncology

## 2019-12-07 VITALS — BP 141/80 | HR 102 | Temp 97.7°F | Resp 20 | Ht 66.5 in | Wt 119.7 lb

## 2019-12-07 DIAGNOSIS — G893 Neoplasm related pain (acute) (chronic): Secondary | ICD-10-CM

## 2019-12-07 DIAGNOSIS — C782 Secondary malignant neoplasm of pleura: Secondary | ICD-10-CM | POA: Diagnosis not present

## 2019-12-07 DIAGNOSIS — C450 Mesothelioma of pleura: Secondary | ICD-10-CM | POA: Diagnosis not present

## 2019-12-07 DIAGNOSIS — T451X5A Adverse effect of antineoplastic and immunosuppressive drugs, initial encounter: Secondary | ICD-10-CM

## 2019-12-07 DIAGNOSIS — D6481 Anemia due to antineoplastic chemotherapy: Secondary | ICD-10-CM

## 2019-12-07 DIAGNOSIS — Z95828 Presence of other vascular implants and grafts: Secondary | ICD-10-CM

## 2019-12-07 DIAGNOSIS — Z5111 Encounter for antineoplastic chemotherapy: Secondary | ICD-10-CM

## 2019-12-07 LAB — CMP (CANCER CENTER ONLY)
ALT: 28 U/L (ref 0–44)
AST: 24 U/L (ref 15–41)
Albumin: 2.6 g/dL — ABNORMAL LOW (ref 3.5–5.0)
Alkaline Phosphatase: 100 U/L (ref 38–126)
Anion gap: 13 (ref 5–15)
BUN: 23 mg/dL (ref 8–23)
CO2: 31 mmol/L (ref 22–32)
Calcium: 9.9 mg/dL (ref 8.9–10.3)
Chloride: 98 mmol/L (ref 98–111)
Creatinine: 0.83 mg/dL (ref 0.61–1.24)
GFR, Est AFR Am: >60 mL/min (ref >60)
GFR, Estimated: >60 mL/min (ref >60)
Glucose, Bld: 121 mg/dL — ABNORMAL HIGH (ref 70–99)
Potassium: 4.5 mmol/L (ref 3.5–5.1)
Sodium: 142 mmol/L (ref 135–145)
Total Bilirubin: 0.2 mg/dL — ABNORMAL LOW (ref 0.3–1.2)
Total Protein: 6.5 g/dL (ref 6.5–8.1)

## 2019-12-07 LAB — CBC WITH DIFFERENTIAL (CANCER CENTER ONLY)
Abs Immature Granulocytes: 0.1 10*3/uL — ABNORMAL HIGH (ref 0.00–0.07)
Basophils Absolute: 0 10*3/uL (ref 0.0–0.1)
Basophils Relative: 0 %
Eosinophils Absolute: 0 10*3/uL (ref 0.0–0.5)
Eosinophils Relative: 1 %
HCT: 27.7 % — ABNORMAL LOW (ref 39.0–52.0)
Hemoglobin: 8.6 g/dL — ABNORMAL LOW (ref 13.0–17.0)
Immature Granulocytes: 1 %
Lymphocytes Relative: 2 %
Lymphs Abs: 0.2 10*3/uL — ABNORMAL LOW (ref 0.7–4.0)
MCH: 30.1 pg (ref 26.0–34.0)
MCHC: 31 g/dL (ref 30.0–36.0)
MCV: 96.9 fL (ref 80.0–100.0)
Monocytes Absolute: 0.9 10*3/uL (ref 0.1–1.0)
Monocytes Relative: 11 %
Neutro Abs: 7.1 10*3/uL (ref 1.7–7.7)
Neutrophils Relative %: 85 %
Platelet Count: 204 10*3/uL (ref 150–400)
RBC: 2.86 MIL/uL — ABNORMAL LOW (ref 4.22–5.81)
RDW: 17.7 % — ABNORMAL HIGH (ref 11.5–15.5)
WBC Count: 8.3 10*3/uL (ref 4.0–10.5)
nRBC: 0 % (ref 0.0–0.2)

## 2019-12-07 LAB — TOTAL PROTEIN, URINE DIPSTICK

## 2019-12-07 LAB — MAGNESIUM: Magnesium: 1.8 mg/dL (ref 1.7–2.4)

## 2019-12-07 MED ORDER — SODIUM CHLORIDE 0.9% FLUSH
10.0000 mL | INTRAVENOUS | Status: DC | PRN
  Administered 2019-12-07: 10 mL via INTRAVENOUS
  Filled 2019-12-07: qty 10

## 2019-12-07 NOTE — Progress Notes (Signed)
Pt called and said his port a cath needle was not removed. He is on his way back now to have it deaccessed and flushed.

## 2019-12-07 NOTE — Telephone Encounter (Signed)
She will see pt in 1-2 months and ask if [facility] refill pain meds with one month supply until pt sees her and sign contract.

## 2019-12-07 NOTE — Telephone Encounter (Signed)
Pain medication management.  Pt saw Dr Ranell Patrick 6/11.LVM at Dr. Aline August office regarding pain medication refills and weaning off pain med.  Pt called today  for oxycodone refill.

## 2019-12-07 NOTE — Telephone Encounter (Signed)
Hi Keesha, I would be happy to take over prescribing in the future if they would like. It would be good if they can give him one month's more supply. I have an appointment with him in 2 weeks during which we can obtain a pain contract and UDS and take over prescribing. Thank you! Best, Martha Clan

## 2019-12-07 NOTE — Patient Instructions (Signed)

## 2019-12-07 NOTE — Progress Notes (Signed)
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  PRIOR THERAPY: Systemic chemotherapy with cisplatin 75 mg/M2, Alimta 500 mg/M2 and Avastin 15 mg/KG every 3 weeks.  First dose July 29, 2019.  Status post 1 cycle.  This was discontinued secondary to intolerance  CURRENT THERAPY: Systemic chemotherapy with carboplatin for AUC of 5, Alimta 500 mg/M2 and Avastin 15 mg/KG every 3 weeks.  First dose September 11, 2019.  Status post 4 cycles.  INTERVAL HISTORY: Mario Harmon 74 y.o. male returns to the clinic today for follow-up visit.  The patient continues to complain of fatigue as well as pain on the right side of the chest.  He was seen by the pain clinic and no adjustment of his pain medication has been done yet.  He was consider for nerve block and has another appointment at the end of the month.  He denied having any shortness of breath but has cough with no hemoptysis.  He denied having any fever or chills.  He has no nausea, vomiting, diarrhea or constipation.  He has no headache or visual changes.  He was seen in the hospital 2 weeks ago and repeat CT scan of the chest showed evidence for disease progression.  The patient is here today for reevaluation and discussion of his treatment options.   MEDICAL HISTORY: Past Medical History:  Diagnosis Date   Mesothelioma [facility])     ALLERGIES:  has No Known Allergies.  MEDICATIONS:  Current Outpatient Medications  Medication Sig Dispense Refill   bisacodyl (DULCOLAX) 5 MG EC tablet Take 5 mg by mouth daily as needed for moderate constipation.      dexamethasone (DECADRON) 4 MG tablet 1 tablet p.o. twice daily the day before, day of and day after chemotherapy every 3 weeks. 40 tablet 1   finasteride (PROSCAR) 5 MG tablet Take 5 mg by mouth daily.     folic acid (FOLVITE) 1 MG tablet TAKE 1 TABLET BY MOUTH EVERY DAY (Patient taking differently: Take 1 mg by mouth daily. ) 90 tablet 1   furosemide (LASIX) 20 MG tablet Take 1 tablet (20 mg total) by mouth 2 (two) times daily. 60 tablet 1   ibuprofen (ADVIL) 600 MG tablet Take 1 tablet (600 mg total) by mouth every 6 (six) hours as needed. Alternate with oxycodone 30 tablet 1   lidocaine-prilocaine (EMLA) cream Apply 1 application topically as needed. Apply small amount to cotton ball and apply to skin over port site at lease 1 hour prior to chemotherapy. Do not rub in and cover plastic wrap. 30 g 0   magnesium oxide (MAG-OX) 400 (241.3 Mg) MG tablet Take 1 tablet (400 mg total) by mouth in the morning, at noon, and at bedtime. 90 tablet 0   morphine (MS CONTIN) 60 MG 12 hr tablet Take 1 tablet (60 mg total) by mouth every 12 (twelve) hours. 60 tablet 0   oxyCODONE (OXY IR/ROXICODONE) 5 MG immediate release tablet Take 1 tablet (5 mg total) by mouth every 6 (six) hours as needed for severe pain. 20 tablet 0   pantoprazole (PROTONIX) 40 MG tablet TAKE 1 TABLET BY MOUTH PRIOR  TO BREAKFAST 30 tablet 1   polycarbophil (FIBERCON) 625 MG tablet Take 625 mg by mouth daily.     potassium chloride SA (KLOR-CON) 20 MEQ tablet Take 1 tablet (20 mEq total) by mouth 2 (two) times daily. 60 tablet 1   prochlorperazine (COMPAZINE) 10 MG tablet TAKE 1 TABLET (10 MG TOTAL) BY MOUTH EVERY 6 (SIX) HOURS AS NEEDED FOR NAUSEA OR VOMITING. 30 tablet 2   tamsulosin (FLOMAX) 0.4 MG CAPS capsule Take 1 capsule (0.4 mg total) by mouth daily. 30 capsule 0   No current facility-administered medications for this visit.    SURGICAL HISTORY:  Past Surgical History:  Procedure Laterality Date   IR IMAGING  GUIDED PORT INSERTION  11/06/2019   WRIST FRACTURE SURGERY Left     REVIEW OF SYSTEMS:  Constitutional: positive for fatigue and weight loss Eyes: negative Ears, nose, mouth, throat, and face: negative Respiratory: positive for cough and pleurisy/chest pain Cardiovascular: negative Gastrointestinal: negative Genitourinary:negative Integument/breast: negative Hematologic/lymphatic: negative Musculoskeletal:negative Neurological: negative Behavioral/Psych: negative Endocrine: negative Allergic/Immunologic: negative   PHYSICAL EXAMINATION: General appearance: alert, cooperative, fatigued and no distress Head: Normocephalic, without obvious abnormality, atraumatic Neck: no adenopathy, no JVD, supple, symmetrical, trachea midline and thyroid not enlarged, symmetric, no tenderness/mass/nodules Lymph nodes: Cervical, supraclavicular, and axillary nodes normal. Resp: clear to auscultation bilaterally Back: symmetric, no curvature. ROM normal. No CVA tenderness. Cardio: regular rate and rhythm, S1, S2 normal, no murmur, click, rub or gallop GI: soft, non-tender; bowel sounds normal; no masses,  no organomegaly Extremities: extremities normal, atraumatic, no cyanosis or edema Neurologic: Alert and oriented X 3, normal strength and tone. Normal symmetric reflexes. Normal coordination and gait  ECOG PERFORMANCE STATUS: 1 - Symptomatic but completely ambulatory  Blood pressure (!) 141/80, pulse (!) 102, temperature 97.7 F (36.5 C), temperature source Temporal, resp. rate 20, height 5' 6.5" (1.689 m), weight 119 lb 11.2 oz (54.3 kg), SpO2 99 %.  LABORATORY DATA: Lab Results  Component Value Date   WBC 4.3 11/30/2019   HGB 8.4 (L) 11/30/2019   HCT 26.3 (L) 11/30/2019   MCV 97.0 11/30/2019   PLT 75 (L) 11/30/2019      Chemistry      Component Value Date/Time   NA 141 11/30/2019 1218   K 4.4 11/30/2019 1218   CL 97 (L) 11/30/2019 1218   CO2 33 (H) 11/30/2019 1218   BUN 17  11/30/2019 1218   CREATININE 0.82 11/30/2019 1218      Component Value Date/Time   CALCIUM 9.8 11/30/2019 1218   ALKPHOS 97 11/30/2019 1218   AST 15 11/30/2019 1218   ALT 15 11/30/2019 1218   BILITOT 0.4 11/30/2019 1218       RADIOGRAPHIC STUDIES: DG Chest 2 View  Result Date: 11/21/2019 CLINICAL DATA:  History of mesothelioma and shortness of breath EXAM: CHEST - 2 VIEW COMPARISON:  10/23/2019 FINDINGS: Cardiac shadow is stable. Lungs are well aerated bilaterally. Left chest wall port is noted in satisfactory position. Persistent pleural thickening is noted laterally on the right although slightly decreased when compare with the prior exam. Persistent small right pleural effusion is noted. These changes are consistent with the given clinical history of mesothelioma. No acute bony abnormality is noted. IMPRESSION: Pleural thickening and pleural effusion on the right consistent with the given clinical history of mesothelioma. No acute abnormality is noted. Electronically Signed   By: Inez Catalina M.D.   On: 11/21/2019 10:08   CT Angio Chest PE W and/or Wo Contrast  Result  Date: 11/21/2019 CLINICAL DATA:  Acute onset shortness of breath, tachypnea, and tachycardia. Undergoing chemotherapy for malignant mesothelioma. EXAM: CT ANGIOGRAPHY CHEST WITH CONTRAST TECHNIQUE: Multidetector CT imaging of the chest was performed using the standard protocol during bolus administration of intravenous contrast. Multiplanar CT image reconstructions and MIPs were obtained to evaluate the vascular anatomy. CONTRAST:  140mL OMNIPAQUE IOHEXOL 350 MG/ML SOLN COMPARISON:  10/23/2019 FINDINGS: Cardiovascular: Satisfactory opacification of pulmonary arteries noted, and no pulmonary emboli identified. No evidence of thoracic aortic dissection or aneurysm. Mediastinum/Nodes: Anterior mediastinal mass along the right anterior heart border measures 4.1 x 2.7 cm on image 170/7 compared to 3.5 x 2.4 cm when measured in same  planes on previous study. Right internal mammary chain lymphadenopathy measures 1.4 cm short axis on image 112/7, compared to 1.5 cm previously. No new masses or lymphadenopathy identified. Lungs/Pleura: Diffuse lobulated right pleural soft tissue density and small amount of fluid shows no significant change compared to prior exam. New tiny left pleural effusion is seen. Mild thickening of interlobular septi and hazy ground-glass opacity in both lower lobes is increased and may be due to mild interstitial edema or pneumonitis. Multiple small pulmonary nodules throughout the left lung show mild increase in size since previous study. One index nodule in the anterior left upper lobe currently measures 10 mm on image 95/12, compared to 6 mm previously. A new 4 mm pulmonary nodule seen in the anterior right upper lobe on image 37/12. Upper abdomen: A 2.1 by 1.7 cm low-attenuation lesion in the liver adjacent to the gallbladder fundus kidney seen on previous study and shows no significant change. This is indeterminate. Musculoskeletal: Erosion of the right lateral 4th, 5th, and 6th ribs by the adjacent pleural soft tissue density shows no significant change. Review of the MIP images confirms the above findings. IMPRESSION: 1. No evidence of pulmonary embolism. 2. New tiny left pleural effusion. Increased thickening of interlobular septi and hazy ground-glass opacity in both lower lobes, suspicious for mild interstitial edema or pneumonitis. 3. Mild increase in size of multiple left pulmonary nodules, consistent with metastatic disease. 4. No significant change in diffuse lobulated right pleural soft tissue density, consistent with known malignant mesothelioma. Stable erosion of right lateral 4th, 5th, and 6th ribs. 5. Mild increase in size of anterior mediastinal mass/lymphadenopathy. Stable mild right internal mammary chain lymphadenopathy. 6. Stable indeterminate low-attenuation lesion in the liver, adjacent to the  gallbladder fundus. Electronically Signed   By: Marlaine Hind M.D.   On: 11/21/2019 12:28   ECHOCARDIOGRAM COMPLETE  Result Date: 11/22/2019    ECHOCARDIOGRAM REPORT   Patient Name:   Mario Harmon Date of Exam: 11/22/2019 Medical Rec #:  [id number]        Height:       68.0 in Accession #:    [id number]       Weight:       114.8 lb Date of Birth:  1945-11-23       BSA:          1.614 m Patient Age:    74 years         BP:           157/87 mmHg Patient Gender: M                HR:           92 bpm. Exam Location:  Inpatient Procedure: 2D Echo Indications:    CHF-Acute Diastolic 767.34 / L93.79  History:  Patient has no prior history of Echocardiogram examinations.                 CHF. History of pleural effusion.  Sonographer:    Vikki Ports Turrentine Referring Phys: [id number] CHRISTOPHER P DANFORD  Sonographer Comments: Technically difficult apical window due to small rib spacing. IMPRESSIONS  1. Left ventricular ejection fraction, by estimation, is 55 to 60%. The left ventricle has normal function. The left ventricle has no regional wall motion abnormalities. Left ventricular diastolic parameters were normal.  2. Right ventricular systolic function is normal. The right ventricular size is normal. Tricuspid regurgitation signal is inadequate for assessing PA pressure.  3. The mitral valve is grossly normal. Trivial mitral valve regurgitation.  4. The aortic valve is tricuspid. Aortic valve regurgitation is not visualized.  5. The inferior vena cava is normal in size with greater than 50% respiratory variability, suggesting right atrial pressure of 3 mmHg.  6. Small anterior and trivial posterior pericardial effusion. FINDINGS  Left Ventricle: Left ventricular ejection fraction, by estimation, is 55 to 60%. The left ventricle has normal function. The left ventricle has no regional wall motion abnormalities. The left ventricular internal cavity size was normal in size. There is  no left ventricular hypertrophy.  Left ventricular diastolic parameters were normal. Right Ventricle: The right ventricular size is normal. No increase in right ventricular wall thickness. Right ventricular systolic function is normal. Tricuspid regurgitation signal is inadequate for assessing PA pressure. Left Atrium: Left atrial size was normal in size. Right Atrium: Right atrial size was normal in size. Pericardium: A small pericardial effusion is present. The pericardial effusion is anterior to the right ventricle. Mitral Valve: The mitral valve is grossly normal. Trivial mitral valve regurgitation. Tricuspid Valve: The tricuspid valve is grossly normal. Tricuspid valve regurgitation is trivial. Aortic Valve: The aortic valve is tricuspid. Aortic valve regurgitation is not visualized. Pulmonic Valve: The pulmonic valve was grossly normal. Pulmonic valve regurgitation is trivial. Aorta: The aortic root is normal in size and structure. Venous: The inferior vena cava is normal in size with greater than 50% respiratory variability, suggesting right atrial pressure of 3 mmHg. IAS/Shunts: No atrial level shunt detected by color flow Doppler.  LEFT VENTRICLE PLAX 2D LVIDd:         4.50 cm  Diastology LVIDs:         3.10 cm  LV e' lateral:   10.10 cm/s LV PW:         0.70 cm  LV E/e' lateral: 6.7 LV IVS:        0.70 cm  LV e' medial:    8.38 cm/s LVOT diam:     1.60 cm  LV E/e' medial:  8.0 LV SV:         35 LV SV Index:   22 LVOT Area:     2.01 cm  RIGHT VENTRICLE RV S prime:     12.20 cm/s TAPSE (M-mode): 1.6 cm LEFT ATRIUM           Index       RIGHT ATRIUM           Index LA diam:      3.00 cm 1.86 cm/m  RA Area:     12.30 cm LA Vol (A4C): 18.2 ml 11.27 ml/m RA Volume:   28.00 ml  17.35 ml/m  AORTIC VALVE LVOT Vmax:   96.60 cm/s LVOT Vmean:  69.600 cm/s LVOT VTI:    0.176 m  AORTA Ao Root  diam: 3.30 cm MITRAL VALVE MV Area (PHT): 3.42 cm    SHUNTS MV Decel Time: 222 msec    Systemic VTI:  0.18 m MV E velocity: 67.30 cm/s  Systemic Diam: 1.60  cm MV A velocity: 74.60 cm/s MV E/A ratio:  0.90 Rozann Lesches MD Electronically signed by Rozann Lesches MD Signature Date/Time: 11/22/2019/1:06:05 PM    Final     ASSESSMENT AND PLAN: This is a very pleasant 74 years old white male with recently diagnosed malignant pleural mesothelioma involving the right chest with evidence of metastatic disease to the mediastinal lymph nodes and large pleural-based masses as well as right pleural effusion. The patient started systemic chemotherapy with cisplatin, Alimta and Avastin status post 1 cycle.  He has intolerance issues with the treatment with cisplatin with delayed nausea and dry heaves as well as the constipation. We switched his treatment to carboplatin, Alimta and Avastin status post 4 cycles.  The patient has a rough time with the treatment with increasing fatigue and weakness.  He also continues to have significant right-sided chest pain. Repeat CT scan of the chest during a visit to the emergency department showed evidence for disease progression. I had a lengthy discussion with the patient today about his current condition and treatment options.  I gave the patient the option of palliative care and hospice referral versus consideration of treatment with immunotherapy.  I discussed with the patient his prognosis with and without treatment.  Unfortunately his prognosis is very poor especially with the disease progression after chemotherapy. He is more interested on the palliative care and hospice option but he would like this to be discussed with his wife in an upcoming visit next week. I will cancel his treatment today. I will arrange for the patient a follow-up visit next week for more discussion of his condition with his wife before calling the hospice service based on his request. For pain management he will continue his current treatment with MS Contin and oxycodone. The patient was advised to call immediately if he has any concerning symptoms  in the interval. The patient voices understanding of current disease status and treatment options and is in agreement with the current care plan.  All questions were answered. The patient knows to call the clinic with any problems, questions or concerns. We can certainly see the patient much sooner if necessary.  Disclaimer: This note was dictated with voice recognition software. Similar sounding words can inadvertently be transcribed and may not be corrected upon review.

## 2019-12-07 NOTE — Telephone Encounter (Signed)
Called Diane and informed her of Dr. Aline August response.

## 2019-12-07 NOTE — Telephone Encounter (Signed)
Diane from Dr. Julien Nordmann office called stating patient is in their office requesting refill on Oxycodone and she is wanting to know if you are taking over refilling Oxycodone.

## 2019-12-08 ENCOUNTER — Telehealth: Payer: Self-pay | Admitting: Medical Oncology

## 2019-12-08 ENCOUNTER — Other Ambulatory Visit: Payer: Self-pay | Admitting: Internal Medicine

## 2019-12-08 MED ORDER — OXYCODONE HCL 5 MG PO TABS: 5.0000 mg | ORAL_TABLET | Freq: Four times a day (QID) | ORAL | 0 refills | Status: AC | PRN

## 2019-12-08 NOTE — Telephone Encounter (Signed)
Oxycodone refill requested.

## 2019-12-09 ENCOUNTER — Telehealth: Payer: Self-pay | Admitting: Internal Medicine

## 2019-12-09 NOTE — Telephone Encounter (Signed)
Scheduled per los. Called and spoke with patient. Confirmed appt 

## 2019-12-14 ENCOUNTER — Telehealth: Payer: Self-pay | Admitting: Medical Oncology

## 2019-12-14 ENCOUNTER — Inpatient Hospital Stay: Payer: Medicare Other

## 2019-12-14 NOTE — Telephone Encounter (Signed)
asked to r/s appt this week to Thursday. Schedule message sent.

## 2019-12-15 ENCOUNTER — Telehealth: Payer: Self-pay | Admitting: *Deleted

## 2019-12-15 NOTE — Telephone Encounter (Signed)
Patient left a message reporting that he has a nerve block setup Friday.  Asked about f/u appointment with Dr. Ranell Patrick.  Asked if Dr. Ranell Patrick had any feedback oncerning nerve block/  I contacted patient and informed f/u appt with Dr. Ranell Patrick is scheduled for June 30th at 11:40am

## 2019-12-16 ENCOUNTER — Other Ambulatory Visit: Payer: Self-pay | Admitting: Family Medicine

## 2019-12-16 ENCOUNTER — Inpatient Hospital Stay: Payer: Medicare Other | Admitting: Physician Assistant

## 2019-12-16 NOTE — Progress Notes (Signed)
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  PRIOR THERAPY:  1) Systemic chemotherapy with cisplatin 75 mg/M2, Alimta 500 mg/M2 and Avastin 15 mg/KG every 3 weeks.  First dose July 29, 2019.  Status post 1 cycle.  This was discontinued secondary to intolerance 2) Systemic chemotherapy with carboplatin for AUC of 5, Alimta 500 mg/M2 and Avastin 15 mg/KG every 3 weeks.  First dose September 11, 2019.  Status post 4 cycles.  CURRENT THERAPY: Hospice  INTERVAL HISTORY: Mario Harmon 74 y.o. male returns to the clinic for follow-up visit accompanied by his wife.  The patient was recently seen in the emergency room for swelling secondary to congestive heart failure.  The patient had a CT scan performed while in the hospital which noted some evidence of disease progression.  The patient had a follow-up appointment with Dr. Julien Nordmann last week and discussed his options which consists of changing his treatment to immunotherapy vs. Chemotherapy with gemcitabine versus a referral to palliative care/hospice.  Dr. Earlie Server had discussed that his prognosis was likely poor due to the fact that the patient had disease progression while on first-line chemotherapy.  Today, the patient is reporting fatigue, weakness, and persistent and considerable right-sided chest pain. He is meeting with the pain clinic tomorrow consideration of a nerve block. He denies any recent fever, chills, night sweats, or weight loss.  He reports his continued persistent cough without hemoptysis or shortness of breath.  He denies any nausea, vomiting, diarrhea, or constipation at this time.  He denies  any headache or visual changes.  The patient is here today for evaluation and rediscussion about his current condition and treatment options.    MEDICAL HISTORY: Past Medical History:  Diagnosis Date  . Mesothelioma [facility])     ALLERGIES:  has No Known Allergies.  MEDICATIONS:  Current Outpatient Medications  Medication Sig Dispense Refill  . bisacodyl (DULCOLAX) 5 MG EC tablet Take 5 mg by mouth daily as needed for moderate constipation.    Marland Kitchen dexamethasone (DECADRON) 4 MG tablet 1 tablet p.o. twice daily the day before, day of and day after chemotherapy every 3 weeks. 40 tablet 1  . finasteride (PROSCAR) 5 MG tablet Take 5 mg by mouth daily.    . folic acid (FOLVITE) 1 MG tablet TAKE 1 TABLET BY MOUTH EVERY DAY (Patient taking differently: Take 1 mg by mouth daily. ) 90 tablet 1  . furosemide (LASIX) 20 MG tablet TAKE 1 TABLET BY MOUTH TWICE A DAY 60 tablet 1  . ibuprofen (ADVIL) 600 MG tablet Take 1 tablet (600 mg total) by mouth every 6 (six) hours as needed. Alternate with oxycodone 30 tablet 1  . KLOR-CON M20 20 MEQ tablet TAKE 1 TABLET BY MOUTH TWICE A DAY 60 tablet 1  . lidocaine-prilocaine (EMLA) cream Apply 1 application topically as needed. Apply small amount to cotton ball and apply to skin over port site at lease 1 hour prior to chemotherapy. Do not rub in and cover plastic wrap. 30 g 0  . magnesium oxide (MAG-OX) 400 (241.3 Mg) MG tablet Take 1 tablet (400 mg total) by mouth in the morning, at noon, and at bedtime. 90 tablet 0  . morphine (  MS CONTIN) 60 MG 12 hr tablet Take 1 tablet (60 mg total) by mouth every 12 (twelve) hours. 60 tablet 0  . oxyCODONE (OXY IR/ROXICODONE) 5 MG immediate release tablet Take 1 tablet (5 mg total) by mouth every 6 (six) hours as needed for severe pain. 20 tablet 0  . pantoprazole (PROTONIX) 40 MG tablet TAKE 1 TABLET BY MOUTH EVERY DAY PRIOR TO BREAKFAST 30 tablet 1  . polycarbophil (FIBERCON) 625 MG tablet Take 625 mg by mouth daily.    .  prochlorperazine (COMPAZINE) 10 MG tablet TAKE 1 TABLET (10 MG TOTAL) BY MOUTH EVERY 6 (SIX) HOURS AS NEEDED FOR NAUSEA OR VOMITING. 30 tablet 2  . tamsulosin (FLOMAX) 0.4 MG CAPS capsule Take 1 capsule (0.4 mg total) by mouth daily. 30 capsule 0   No current facility-administered medications for this visit.    SURGICAL HISTORY:  Past Surgical History:  Procedure Laterality Date  . IR IMAGING GUIDED PORT INSERTION  11/06/2019  . WRIST FRACTURE SURGERY Left     REVIEW OF SYSTEMS:   Review of Systems  Constitutional: Positive for fatigue and generalized weakness. negative for appetite change, chills, fever and unexpected weight change.  HENT: Negative for mouth sores, nosebleeds, sore throat and trouble swallowing.   Eyes: Negative for eye problems and icterus.  Respiratory: Positive for cough and pleurisy.  Negative for hemoptysis, shortness of breath and wheezing.   Cardiovascular: Positive for right-sided chest pain. negative for leg swelling.  Gastrointestinal: Negative for abdominal pain, constipation, diarrhea, nausea and vomiting.  Genitourinary: Negative for bladder incontinence, difficulty urinating, dysuria, frequency and hematuria.   Musculoskeletal: Negative for back pain, gait problem, neck pain and neck stiffness.  Skin: Negative for itching and rash.  Neurological: Negative for dizziness, extremity weakness, gait problem, headaches, light-headedness and seizures.  Hematological: Negative for adenopathy. Does not bruise/bleed easily.  Psychiatric/Behavioral: Negative for confusion, depression and sleep disturbance. The patient is not nervous/anxious.     PHYSICAL EXAMINATION:  Blood pressure 124/85, pulse (!) 119, temperature 98.1 F (36.7 C), temperature source Temporal, resp. rate (!) 21, height 5' 6.5" (1.689 m), weight 119 lb (54 kg), SpO2 96 %.  ECOG PERFORMANCE STATUS: 2 - Symptomatic, <50% confined to bed  Physical Exam  Constitutional: Oriented to person, place,  and time and cachectic appearing male and in no distress.  HENT:  Head: Normocephalic and atraumatic.  Mouth/Throat: Oropharynx is clear and moist. No oropharyngeal exudate.  Eyes: Conjunctivae are normal. Right eye exhibits no discharge. Left eye exhibits no discharge. No scleral icterus.  Neck: Normal range of motion. Neck supple.  Cardiovascular: Tachycardic, regular rhythm, normal heart sounds and intact distal pulses.   Pulmonary/Chest: Effort normal.  Decreased breath sounds in the right lung. no respiratory distress. No wheezes. No rales.  Abdominal: Soft. Bowel sounds are normal. Exhibits no distension and no mass. There is no tenderness.  Musculoskeletal: Normal range of motion. Exhibits no edema.  Lymphadenopathy:    No cervical adenopathy.  Neurological: Alert and oriented to person, place, and time. Exhibits normal muscle tone. Gait normal. Coordination normal.  Skin: Skin is warm and dry. No rash noted. Not diaphoretic. No erythema. No pallor.  Psychiatric: Mood, memory and judgment normal.  Vitals reviewed.  LABORATORY DATA: Lab Results  Component Value Date   WBC 8.3 12/07/2019   HGB 8.6 (L) 12/07/2019   HCT 27.7 (L) 12/07/2019   MCV 96.9 12/07/2019   PLT 204 12/07/2019      Chemistry  Component Value Date/Time   NA 142 12/07/2019 1206   K 4.5 12/07/2019 1206   CL 98 12/07/2019 1206   CO2 31 12/07/2019 1206   BUN 23 12/07/2019 1206   CREATININE 0.83 12/07/2019 1206      Component Value Date/Time   CALCIUM 9.9 12/07/2019 1206   ALKPHOS 100 12/07/2019 1206   AST 24 12/07/2019 1206   ALT 28 12/07/2019 1206   BILITOT 0.2 (L) 12/07/2019 1206       RADIOGRAPHIC STUDIES:  DG Chest 2 View  Result Date: 11/21/2019 CLINICAL DATA:  History of mesothelioma and shortness of breath EXAM: CHEST - 2 VIEW COMPARISON:  10/23/2019 FINDINGS: Cardiac shadow is stable. Lungs are well aerated bilaterally. Left chest wall port is noted in satisfactory position.  Persistent pleural thickening is noted laterally on the right although slightly decreased when compare with the prior exam. Persistent small right pleural effusion is noted. These changes are consistent with the given clinical history of mesothelioma. No acute bony abnormality is noted. IMPRESSION: Pleural thickening and pleural effusion on the right consistent with the given clinical history of mesothelioma. No acute abnormality is noted. Electronically Signed   By: Inez Catalina M.D.   On: 11/21/2019 10:08   CT Angio Chest PE W and/or Wo Contrast  Result Date: 11/21/2019 CLINICAL DATA:  Acute onset shortness of breath, tachypnea, and tachycardia. Undergoing chemotherapy for malignant mesothelioma. EXAM: CT ANGIOGRAPHY CHEST WITH CONTRAST TECHNIQUE: Multidetector CT imaging of the chest was performed using the standard protocol during bolus administration of intravenous contrast. Multiplanar CT image reconstructions and MIPs were obtained to evaluate the vascular anatomy. CONTRAST:  145m OMNIPAQUE IOHEXOL 350 MG/ML SOLN COMPARISON:  10/23/2019 FINDINGS: Cardiovascular: Satisfactory opacification of pulmonary arteries noted, and no pulmonary emboli identified. No evidence of thoracic aortic dissection or aneurysm. Mediastinum/Nodes: Anterior mediastinal mass along the right anterior heart border measures 4.1 x 2.7 cm on image 170/7 compared to 3.5 x 2.4 cm when measured in same planes on previous study. Right internal mammary chain lymphadenopathy measures 1.4 cm short axis on image 112/7, compared to 1.5 cm previously. No new masses or lymphadenopathy identified. Lungs/Pleura: Diffuse lobulated right pleural soft tissue density and small amount of fluid shows no significant change compared to prior exam. New tiny left pleural effusion is seen. Mild thickening of interlobular septi and hazy ground-glass opacity in both lower lobes is increased and may be due to mild interstitial edema or pneumonitis. Multiple  small pulmonary nodules throughout the left lung show mild increase in size since previous study. One index nodule in the anterior left upper lobe currently measures 10 mm on image 95/12, compared to 6 mm previously. A new 4 mm pulmonary nodule seen in the anterior right upper lobe on image 37/12. Upper abdomen: A 2.1 by 1.7 cm low-attenuation lesion in the liver adjacent to the gallbladder fundus kidney seen on previous study and shows no significant change. This is indeterminate. Musculoskeletal: Erosion of the right lateral 4th, 5th, and 6th ribs by the adjacent pleural soft tissue density shows no significant change. Review of the MIP images confirms the above findings. IMPRESSION: 1. No evidence of pulmonary embolism. 2. New tiny left pleural effusion. Increased thickening of interlobular septi and hazy ground-glass opacity in both lower lobes, suspicious for mild interstitial edema or pneumonitis. 3. Mild increase in size of multiple left pulmonary nodules, consistent with metastatic disease. 4. No significant change in diffuse lobulated right pleural soft tissue density, consistent with known malignant mesothelioma. Stable  erosion of right lateral 4th, 5th, and 6th ribs. 5. Mild increase in size of anterior mediastinal mass/lymphadenopathy. Stable mild right internal mammary chain lymphadenopathy. 6. Stable indeterminate low-attenuation lesion in the liver, adjacent to the gallbladder fundus. Electronically Signed   By: Marlaine Hind M.D.   On: 11/21/2019 12:28   ECHOCARDIOGRAM COMPLETE  Result Date: 11/22/2019    ECHOCARDIOGRAM REPORT   Patient Name:   Mario Harmon Date of Exam: 11/22/2019 Medical Rec #:  [id number]        Height:       68.0 in Accession #:    [id number]       Weight:       114.8 lb Date of Birth:  [birth year]       BSA:          1.614 m Patient Age:    11 years         BP:           157/87 mmHg Patient Gender: M                HR:           92 bpm. Exam Location:  Inpatient Procedure:  2D Echo Indications:    CHF-Acute Diastolic 121.97 / J88.32  History:        Patient has no prior history of Echocardiogram examinations.                 CHF. History of pleural effusion.  Sonographer:    Vikki Ports Turrentine Referring Phys: [id number] CHRISTOPHER P DANFORD  Sonographer Comments: Technically difficult apical window due to small rib spacing. IMPRESSIONS  1. Left ventricular ejection fraction, by estimation, is 55 to 60%. The left ventricle has normal function. The left ventricle has no regional wall motion abnormalities. Left ventricular diastolic parameters were normal.  2. Right ventricular systolic function is normal. The right ventricular size is normal. Tricuspid regurgitation signal is inadequate for assessing PA pressure.  3. The mitral valve is grossly normal. Trivial mitral valve regurgitation.  4. The aortic valve is tricuspid. Aortic valve regurgitation is not visualized.  5. The inferior vena cava is normal in size with greater than 50% respiratory variability, suggesting right atrial pressure of 3 mmHg.  6. Small anterior and trivial posterior pericardial effusion. FINDINGS  Left Ventricle: Left ventricular ejection fraction, by estimation, is 55 to 60%. The left ventricle has normal function. The left ventricle has no regional wall motion abnormalities. The left ventricular internal cavity size was normal in size. There is  no left ventricular hypertrophy. Left ventricular diastolic parameters were normal. Right Ventricle: The right ventricular size is normal. No increase in right ventricular wall thickness. Right ventricular systolic function is normal. Tricuspid regurgitation signal is inadequate for assessing PA pressure. Left Atrium: Left atrial size was normal in size. Right Atrium: Right atrial size was normal in size. Pericardium: A small pericardial effusion is present. The pericardial effusion is anterior to the right ventricle. Mitral Valve: The mitral valve is grossly normal.  Trivial mitral valve regurgitation. Tricuspid Valve: The tricuspid valve is grossly normal. Tricuspid valve regurgitation is trivial. Aortic Valve: The aortic valve is tricuspid. Aortic valve regurgitation is not visualized. Pulmonic Valve: The pulmonic valve was grossly normal. Pulmonic valve regurgitation is trivial. Aorta: The aortic root is normal in size and structure. Venous: The inferior vena cava is normal in size with greater than 50% respiratory variability, suggesting right atrial pressure of 3 mmHg. IAS/Shunts: No atrial level  shunt detected by color flow Doppler.  LEFT VENTRICLE PLAX 2D LVIDd:         4.50 cm  Diastology LVIDs:         3.10 cm  LV e' lateral:   10.10 cm/s LV PW:         0.70 cm  LV E/e' lateral: 6.7 LV IVS:        0.70 cm  LV e' medial:    8.38 cm/s LVOT diam:     1.60 cm  LV E/e' medial:  8.0 LV SV:         35 LV SV Index:   22 LVOT Area:     2.01 cm  RIGHT VENTRICLE RV S prime:     12.20 cm/s TAPSE (M-mode): 1.6 cm LEFT ATRIUM           Index       RIGHT ATRIUM           Index LA diam:      3.00 cm 1.86 cm/m  RA Area:     12.30 cm LA Vol (A4C): 18.2 ml 11.27 ml/m RA Volume:   28.00 ml  17.35 ml/m  AORTIC VALVE LVOT Vmax:   96.60 cm/s LVOT Vmean:  69.600 cm/s LVOT VTI:    0.176 m  AORTA Ao Root diam: 3.30 cm MITRAL VALVE MV Area (PHT): 3.42 cm    SHUNTS MV Decel Time: 222 msec    Systemic VTI:  0.18 m MV E velocity: 67.30 cm/s  Systemic Diam: 1.60 cm MV A velocity: 74.60 cm/s MV E/A ratio:  0.90 Rozann Lesches MD Electronically signed by Rozann Lesches MD Signature Date/Time: 11/22/2019/1:06:05 PM    Final      ASSESSMENT/PLAN:  This is a very pleasant 74 year old Caucasian male recently diagnosed with malignant pleural mesothelioma involving the right chest with evidence of metastatic disease to the mediastinal lymph nodes and a large pleural-based mass as well as right pleural effusion.  The patient was then started on systemic chemotherapy with cisplatin, Alimta, and  Avastin.  He is status post 1 cycle of treatment but experienced moderate side effects with delayed nausea and dry heaving as well as constipation.  Therefore, the patient's treatment was switched to carboplatin, Alimta, and Avastin.  He is status post 4 cycles.  He experienced increasing fatigue and weakness.  He also continues to have significant right-sided chest pain.  The patient was seen in the emergency department for the chief complaint of congestive heart failure.  He had a CT scan performed during this visit which noted some evidence of disease progression.  The patient had met with Dr. Julien Nordmann last week who had a lengthy discussion with the patient about his current condition and treatment options.  The patient was given the option of palliative care/hospice referral vs consideration of second line treatment with immunotherapy (nivolumab/Yervoy vs. Keytruda) versus consideration of chemotherapy with gemcitabine.  The patient decided to discuss these options with his wife before making a decision.  Today, the patient returns to the clinic with his wife.  The patient was seen with Dr. Julien Nordmann.  Dr. Julien Nordmann had a lengthy discussion with the patient and his wife about his current condition, treatment options, and prognosis.  Given the fact that the patient had evidence of disease progression while on first-line chemotherapy, Dr. Julien Nordmann discussed that the prognosis is very poor.  Dr. Julien Nordmann gave the patient the option of a referral to palliative care versus hospice versus consideration of second  line treatment.  Dr. Earlie Server discussed the response rate with second line options.  The patient would like to move forward with a referral to hospice for consultation.  We have reached out to [place] who will be in touch with the patient tomorrow.  The patient's wife had dropped off paperwork for leave of absence from work.  He will take care of his paperwork and return it to her once  complete.  Will not schedule any follow-up appointments at this time but will be available to the patient on an as-needed basis.  Dr. Julien Nordmann encouraged the patient to go to the pain clinic tomorrow to see if a nerve block will improve his pain.  The patient was advised to call immediately if he has any concerning symptoms in the interval. The patient voices understanding of current disease status and treatment options and is in agreement with the current care plan. All questions were answered. The patient knows to call the clinic with any problems, questions or concerns. We can certainly see the patient much sooner if necessary    Orders Placed This Encounter  Procedures  . Ambulatory referral to Hospice    Referral Priority:   Routine    Referral Type:   Consultation    Requested Specialty:   Hospice Services    Number of Visits Requested:   [place], PA-C 12/17/19  ADDENDUM: Hematology/Oncology Attending: I had a face-to-face encounter with the patient today.  I recommended his care plan.  The patient came to the clinic today accompanied by his wife.  He is a very pleasant 14 years old white male who was diagnosed with metastatic malignant pleural mesothelioma involving the right hemothorax.  The patient underwent systemic chemotherapy initially with cisplatin and Alimta with Avastin but he could not tolerate the treatment with cisplatin.  His treatment was changed to carboplatin, Alimta and Avastin and he tolerated the treatment much better.  He is status post 4 cycles of this treatment. Unfortunately his recent imaging study showed significant evidence for disease progression and the patient continues to have significant pain on the right side of the chest. I had a lengthy discussion with the patient last visit about his current condition and treatment options.  He came with his wife today for more detailed discussion of these options.  I gave the patient again  the option of treatment with a combination of immunotherapy with ipilimumab and nivolumab versus single agent gemcitabine versus palliative care and hospice referral. I explained to the patient the pros and cons of each of these options. The patient indicated that he is not interested in proceeding with any further therapy and he would like to consider palliative care and hospice at this point. We will call the hospice service of [facility] to see the patient. For the right-sided chest pain, he is followed by the pain clinic and was referred to Dr. Johnny Bridge in [facility] for consideration of nerve block. I will see the patient on as-needed basis at this point but I will be happy to cover his needs for the hospice service. He was advised to call immediately if he has any concerning symptoms in the interval.  Disclaimer: This note was dictated with voice recognition software. Similar sounding words can inadvertently be transcribed and may be missed upon review. Eilleen Kempf, MD 12/17/19

## 2019-12-17 ENCOUNTER — Inpatient Hospital Stay (HOSPITAL_BASED_OUTPATIENT_CLINIC_OR_DEPARTMENT_OTHER): Payer: Medicare Other | Admitting: Physician Assistant

## 2019-12-17 ENCOUNTER — Other Ambulatory Visit: Payer: Self-pay

## 2019-12-17 ENCOUNTER — Other Ambulatory Visit: Payer: Self-pay | Admitting: Internal Medicine

## 2019-12-17 ENCOUNTER — Ambulatory Visit: Payer: Medicare Other | Admitting: Internal Medicine

## 2019-12-17 ENCOUNTER — Telehealth: Payer: Self-pay

## 2019-12-17 VITALS — BP 124/85 | HR 119 | Temp 98.1°F | Resp 21 | Ht 66.5 in | Wt 119.0 lb

## 2019-12-17 DIAGNOSIS — C782 Secondary malignant neoplasm of pleura: Secondary | ICD-10-CM

## 2019-12-17 DIAGNOSIS — Z7189 Other specified counseling: Secondary | ICD-10-CM

## 2019-12-17 DIAGNOSIS — Z5111 Encounter for antineoplastic chemotherapy: Secondary | ICD-10-CM

## 2019-12-17 DIAGNOSIS — R112 Nausea with vomiting, unspecified: Secondary | ICD-10-CM

## 2019-12-17 DIAGNOSIS — C450 Mesothelioma of pleura: Secondary | ICD-10-CM

## 2019-12-17 NOTE — Telephone Encounter (Signed)
Hospice referral called in to [place]. Spoke to [place] with [facility] and patient information given. Per Roderic Ovens, patient will be contacted to schedule an appointment sometime this week.

## 2019-12-17 NOTE — Telephone Encounter (Signed)
Left VM for patient yesterday with my cell phone #

## 2019-12-18 ENCOUNTER — Encounter: Payer: Self-pay | Admitting: Physician Assistant

## 2019-12-21 ENCOUNTER — Other Ambulatory Visit: Payer: Medicare Other

## 2019-12-23 ENCOUNTER — Encounter: Payer: Medicare Other | Admitting: Physical Medicine and Rehabilitation

## 2019-12-30 ENCOUNTER — Other Ambulatory Visit: Payer: Medicare Other

## 2019-12-30 ENCOUNTER — Ambulatory Visit: Payer: Medicare Other | Admitting: Physician Assistant

## 2019-12-30 ENCOUNTER — Ambulatory Visit: Payer: Medicare Other

## 2020-01-10 ENCOUNTER — Other Ambulatory Visit: Payer: Self-pay | Admitting: Family Medicine

## 2020-01-10 ENCOUNTER — Other Ambulatory Visit: Payer: Self-pay | Admitting: Internal Medicine

## 2020-01-10 DIAGNOSIS — R112 Nausea with vomiting, unspecified: Secondary | ICD-10-CM

## 2020-01-18 ENCOUNTER — Telehealth: Payer: Self-pay | Admitting: *Deleted

## 2020-01-18 NOTE — Telephone Encounter (Signed)
[facility] called to report patient died at [facility] on 01-28-2020.   Dr. Julien Nordmann attempted to complete online and was unsuccessful.    Made Sherry @ [facility] aware we will need hard copy since online was unsuccessful.

## 2020-01-18 NOTE — Telephone Encounter (Signed)
[facility] is in need of death certificate completed.  They state they are unable to process paper death certificates anymore.    Dr. Julien Nordmann was unsuccessful at getting certificate to go through the new online system.  They encouraged him to call that help desk because this is needed and they have no way of printing certificates anymore.  Routed to MD to process.

## 2020-01-24 DEATH — deceased

## 2021-07-11 IMAGING — CT CT CHEST W/ CM
2 of 4 series · 15 of 36 positions shown, 18 images · IV contrast (omnipaque)
Comparison: None

CLINICAL DATA: Evaluate pleural effusion.

EXAM:
CT CHEST WITH CONTRAST
TECHNIQUE: Multidetector CT imaging of the chest was performed during
intravenous contrast administration.
CONTRAST:  75mL OMNIPAQUE IOHEXOL 300 MG/ML  SOLN

[Series 3: chest w · axial · 0.77mm/px · z∈[-216,+78]mm · 12 of 173 slices shown, 15 images]
[im 13/173  mediastinal]
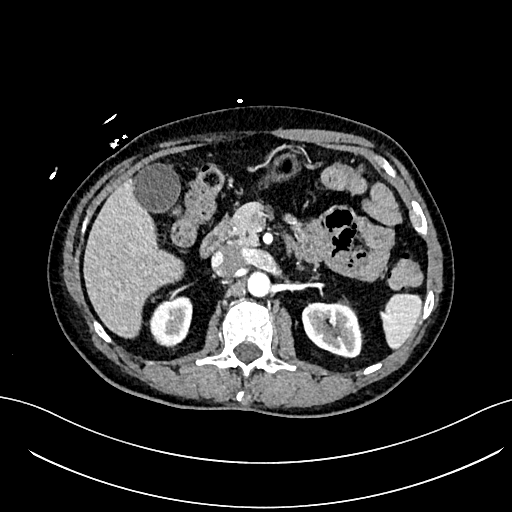
[im 13/173  lung]
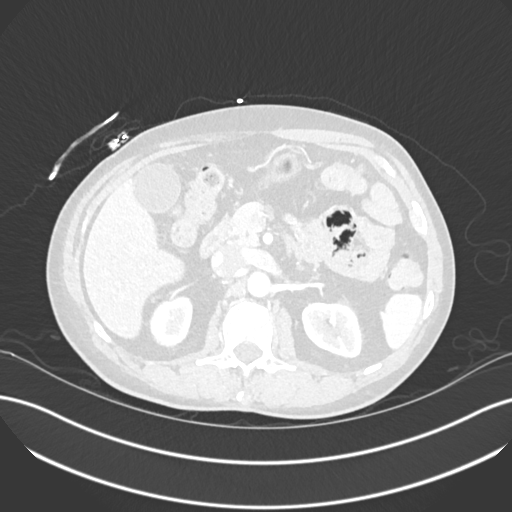
[im 25/173  lung]
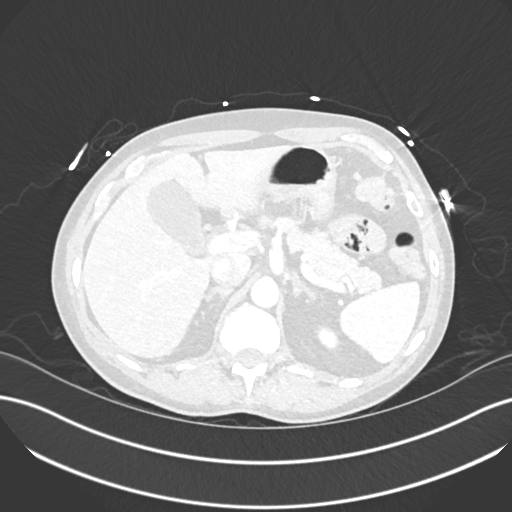
[im 37/173  lung]
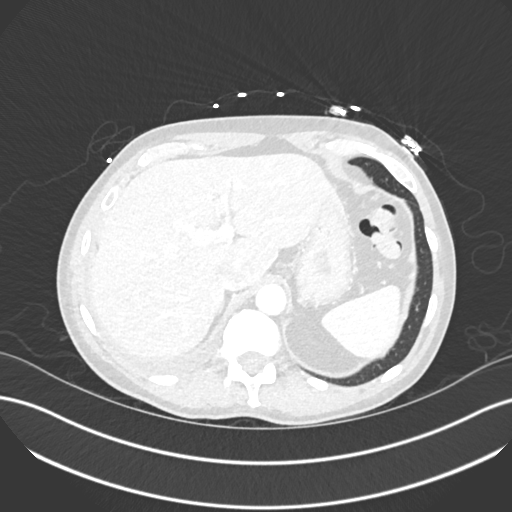
[im 50/173  lung]
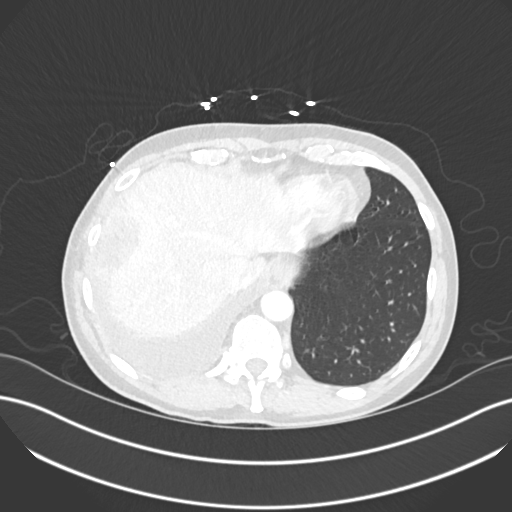
[im 62/173  mediastinal]
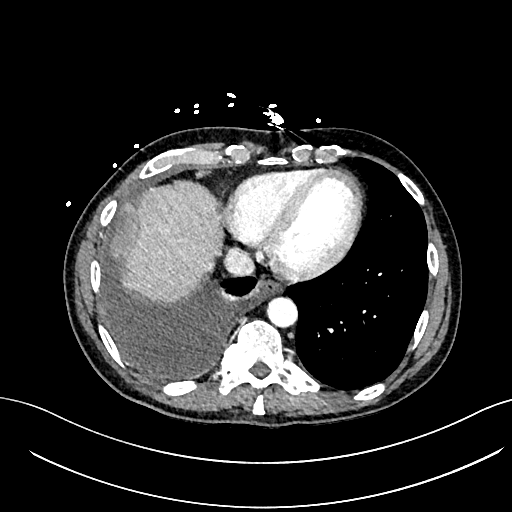
[im 62/173  lung]
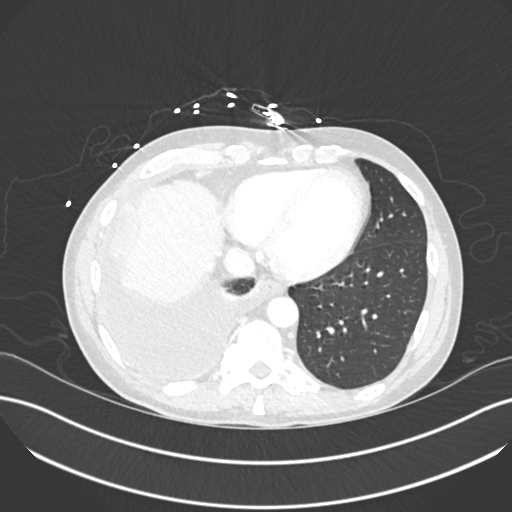
[im 74/173  lung]
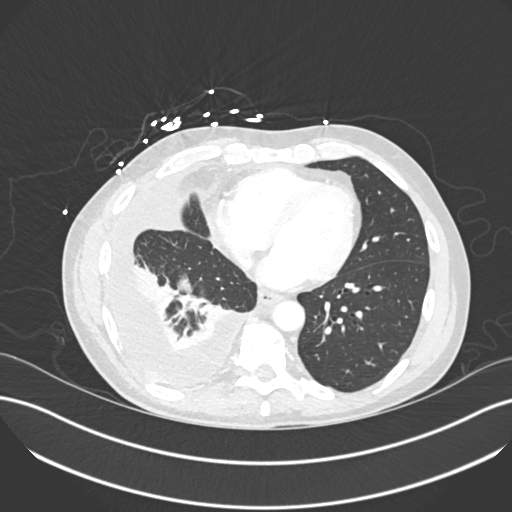
[im 99/173  lung]
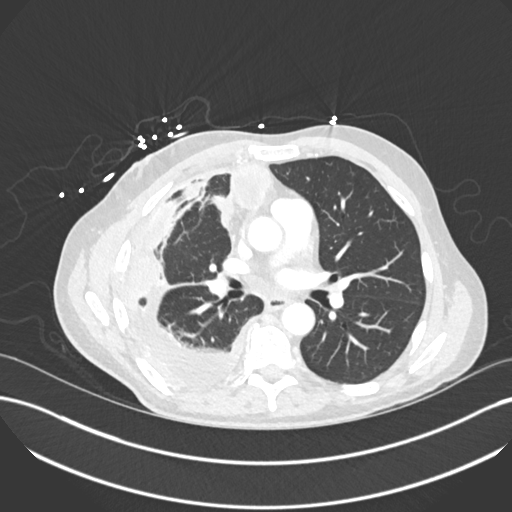
[im 111/173  lung]
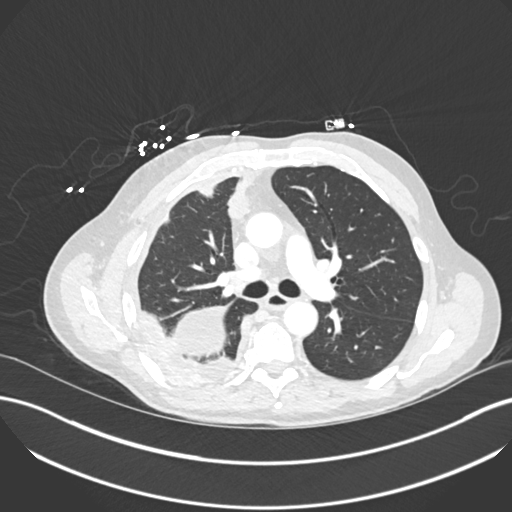
[im 123/173  mediastinal]
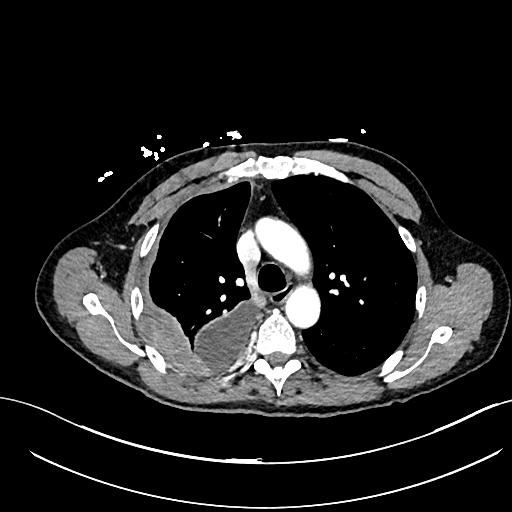
[im 123/173  lung]
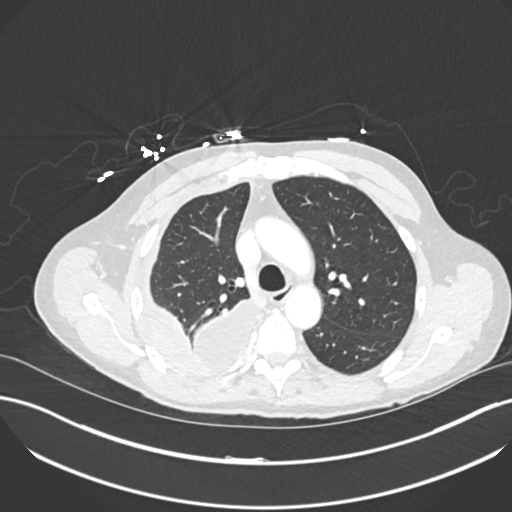
[im 136/173  lung]
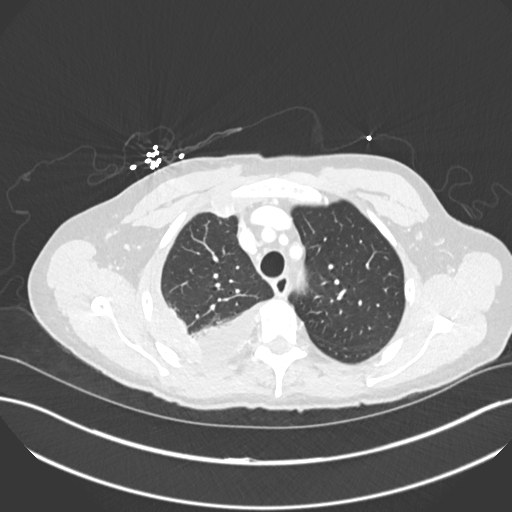
[im 148/173  lung]
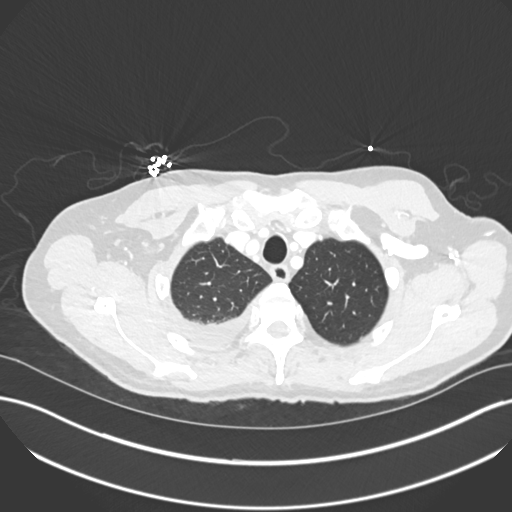
[im 160/173  lung]
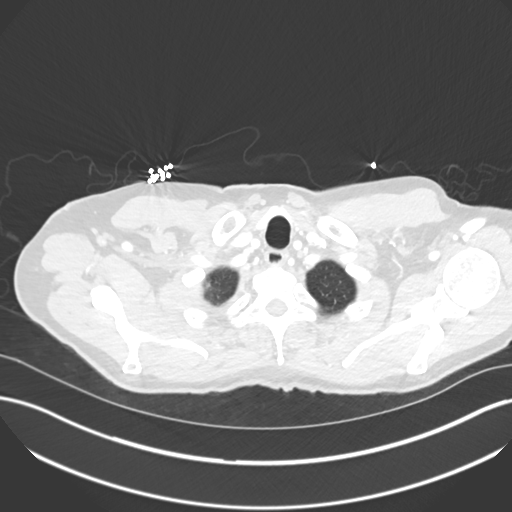

[Series 6: cor · coronal · 0.72mm/px · 3 of 141 slices shown]
[im 29/141  lung]
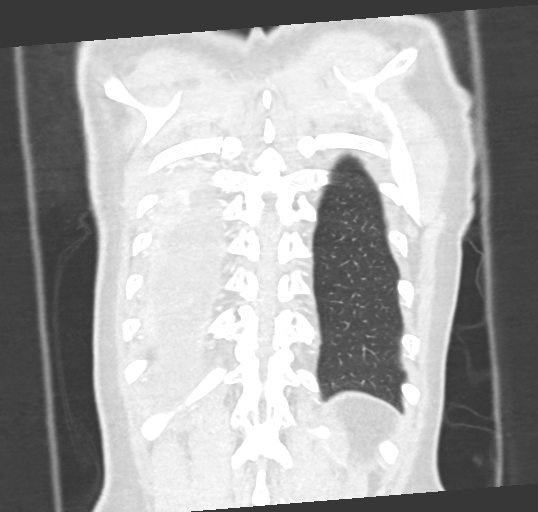
[im 57/141  lung]
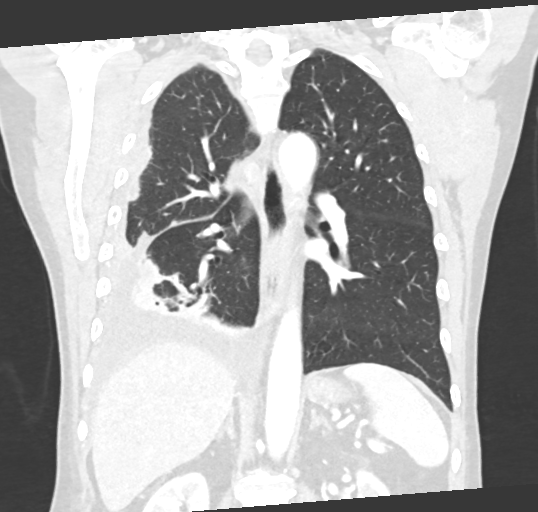
[im 85/141  lung]
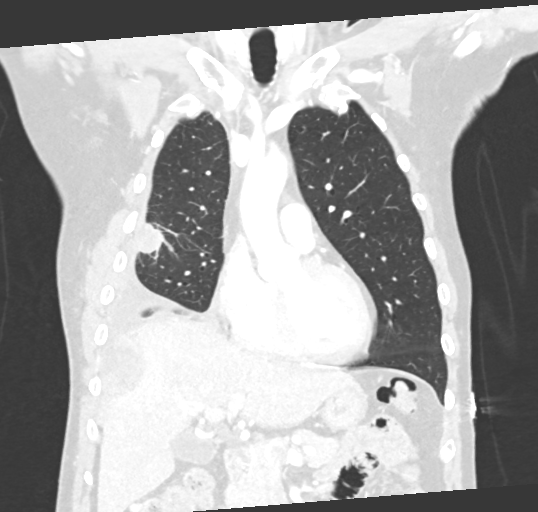

[15 of 36 positions shown; findings below may reference images not displayed]

FINDINGS: Cardiovascular: The heart size is normal. No pericardial effusion
identified.

Mediastinum/Nodes: Enlarged right internal mammary lymph node
measures 2.2 cm, image 41/3 within the anterior mediastinum there is
a ovoid solid mass measuring 4.7 x 2.9 cm, image 71/3 thyroid gland,
trachea, and esophagus demonstrate no significant findings.

Lungs/Pleura: Moderate loculated right pleural effusion identified
with extensive solid enhancing pleural based tumor. Index mass
within the right lateral costophrenic sulcus measures 6.5 x 3.1 cm,
image 123/3.

Index right lateral pleural based tumor overlying the mid lung
measures 4.0 x 2.5 cm, image 80/3. Pleural tumor overlying the
posterolateral right upper lobe measures 7.8 x 1.9 cm, image 59/3.

Upper Abdomen: No acute abnormality.

Musculoskeletal: No chest wall abnormality. No acute or significant
osseous findings. Lytic lesion involving the lateral aspect of the
right fourth and sixth ribs.
IMPRESSION: 1. Loculated right pleural effusion with multifocal solid enhancing
pleural based tumors compatible with pleural based metastasis. In
the absence of known malignancy findings are suspicious for primary
bronchogenic carcinoma. Further investigation with PET-CT and tissue
sampling advised.
2. Enlarged right internal mammary lymph node and anterior
mediastinal mass compatible with metastatic adenopathy.
3. Lytic lesion involving the lateral aspect of the right fourth and
sixth ribs is identified compatible with osseous metastasis.
4. Aortic atherosclerosis.

Aortic Atherosclerosis ([id number]).
